# Patient Record
Sex: Male | Born: 1935
Health system: Southern US, Community
[De-identification: ages and names within clinical notes are randomized; demographics above are authoritative.]

## PROBLEM LIST (undated history)

## (undated) DIAGNOSIS — I429 Cardiomyopathy, unspecified: Secondary | ICD-10-CM

## (undated) DIAGNOSIS — N400 Enlarged prostate without lower urinary tract symptoms: Secondary | ICD-10-CM

## (undated) DIAGNOSIS — Z952 Presence of prosthetic heart valve: Secondary | ICD-10-CM

## (undated) DIAGNOSIS — I4821 Permanent atrial fibrillation: Secondary | ICD-10-CM

## (undated) DIAGNOSIS — E785 Hyperlipidemia, unspecified: Secondary | ICD-10-CM

## (undated) DIAGNOSIS — I10 Essential (primary) hypertension: Secondary | ICD-10-CM

## (undated) DIAGNOSIS — E7800 Pure hypercholesterolemia, unspecified: Secondary | ICD-10-CM

## (undated) DIAGNOSIS — D509 Iron deficiency anemia, unspecified: Secondary | ICD-10-CM

## (undated) DIAGNOSIS — I442 Atrioventricular block, complete: Secondary | ICD-10-CM

## (undated) HISTORY — DX: Iron deficiency anemia, unspecified: D50.9

## (undated) HISTORY — DX: Cardiomyopathy, unspecified: I42.9

## (undated) HISTORY — DX: Essential (primary) hypertension: I10

## (undated) HISTORY — DX: Permanent atrial fibrillation: I48.21

## (undated) HISTORY — DX: Hyperlipidemia, unspecified: E78.5

## (undated) HISTORY — DX: Benign prostatic hyperplasia without lower urinary tract symptoms: N40.0

## (undated) HISTORY — DX: Atrioventricular block, complete: I44.2

---

## 2007-10-03 ENCOUNTER — Emergency Department (HOSPITAL_COMMUNITY): Admission: EM | Admit: 2007-10-03 | Discharge: 2007-10-03 | Payer: Self-pay | Admitting: Emergency Medicine

## 2008-09-15 ENCOUNTER — Emergency Department (HOSPITAL_COMMUNITY): Admission: EM | Admit: 2008-09-15 | Discharge: 2008-09-15 | Payer: Self-pay | Admitting: Emergency Medicine

## 2010-04-29 HISTORY — PX: TRANSCATHETER AORTIC VALVE REPLACEMENT, TRANSAORTIC: SHX6402

## 2010-05-12 HISTORY — PX: PACEMAKER INSERTION: SHX728

## 2010-06-20 ENCOUNTER — Inpatient Hospital Stay (HOSPITAL_COMMUNITY)
Admission: EM | Admit: 2010-06-20 | Discharge: 2010-06-21 | DRG: 293 | Disposition: A | Discharge status: Home / Self Care | Payer: Medicare Other | Attending: Cardiology | Admitting: Cardiology

## 2010-06-20 ENCOUNTER — Emergency Department (HOSPITAL_COMMUNITY): Payer: Medicare Other

## 2010-06-20 DIAGNOSIS — I509 Heart failure, unspecified: Secondary | ICD-10-CM | POA: Diagnosis present

## 2010-06-20 DIAGNOSIS — I2510 Atherosclerotic heart disease of native coronary artery without angina pectoris: Secondary | ICD-10-CM | POA: Diagnosis present

## 2010-06-20 DIAGNOSIS — G589 Mononeuropathy, unspecified: Secondary | ICD-10-CM | POA: Diagnosis present

## 2010-06-20 DIAGNOSIS — Z7902 Long term (current) use of antithrombotics/antiplatelets: Secondary | ICD-10-CM

## 2010-06-20 DIAGNOSIS — I359 Nonrheumatic aortic valve disorder, unspecified: Secondary | ICD-10-CM | POA: Diagnosis present

## 2010-06-20 DIAGNOSIS — E7800 Pure hypercholesterolemia, unspecified: Secondary | ICD-10-CM | POA: Diagnosis present

## 2010-06-20 DIAGNOSIS — Z954 Presence of other heart-valve replacement: Secondary | ICD-10-CM

## 2010-06-20 DIAGNOSIS — H409 Unspecified glaucoma: Secondary | ICD-10-CM | POA: Diagnosis present

## 2010-06-20 DIAGNOSIS — I129 Hypertensive chronic kidney disease with stage 1 through stage 4 chronic kidney disease, or unspecified chronic kidney disease: Secondary | ICD-10-CM | POA: Diagnosis present

## 2010-06-20 DIAGNOSIS — I5023 Acute on chronic systolic (congestive) heart failure: Principal | ICD-10-CM | POA: Diagnosis present

## 2010-06-20 DIAGNOSIS — Z951 Presence of aortocoronary bypass graft: Secondary | ICD-10-CM

## 2010-06-20 DIAGNOSIS — N189 Chronic kidney disease, unspecified: Secondary | ICD-10-CM | POA: Diagnosis present

## 2010-06-20 DIAGNOSIS — Z950 Presence of cardiac pacemaker: Secondary | ICD-10-CM

## 2010-06-20 DIAGNOSIS — E785 Hyperlipidemia, unspecified: Secondary | ICD-10-CM | POA: Diagnosis present

## 2010-06-20 DIAGNOSIS — E119 Type 2 diabetes mellitus without complications: Secondary | ICD-10-CM | POA: Diagnosis present

## 2010-06-20 LAB — CBC
HCT: 30.4 % — ABNORMAL LOW (ref 39.0–52.0)
MCH: 31.6 pg (ref 26.0–34.0)
MCHC: 33.9 g/dL (ref 30.0–36.0)
MCV: 93.3 fL (ref 78.0–100.0)
RDW: 14.3 % (ref 11.5–15.5)

## 2010-06-20 LAB — BRAIN NATRIURETIC PEPTIDE: Pro B Natriuretic peptide (BNP): 1013 pg/mL — ABNORMAL HIGH (ref 0.0–100.0)

## 2010-06-20 LAB — COMPREHENSIVE METABOLIC PANEL
ALT: 20 U/L (ref 0–53)
Alkaline Phosphatase: 53 U/L (ref 39–117)
CO2: 25 mEq/L (ref 19–32)
Chloride: 108 mEq/L (ref 96–112)
GFR calc non Af Amer: 49 mL/min — ABNORMAL LOW (ref >60)
Glucose, Bld: 55 mg/dL — ABNORMAL LOW (ref 70–99)
Potassium: 4.6 mEq/L (ref 3.5–5.1)
Sodium: 138 mEq/L (ref 135–145)

## 2010-06-20 LAB — POCT CARDIAC MARKERS: CKMB, poc: <1 ng/mL — ABNORMAL LOW (ref 1.0–8.0)

## 2010-06-20 LAB — DIFFERENTIAL
Basophils Absolute: 0 10*3/uL (ref 0.0–0.1)
Eosinophils Relative: 9 % — ABNORMAL HIGH (ref 0–5)
Lymphocytes Relative: 24 % (ref 12–46)
Lymphs Abs: 1 10*3/uL (ref 0.7–4.0)
Monocytes Absolute: 0.6 10*3/uL (ref 0.1–1.0)

## 2010-06-20 LAB — GLUCOSE, CAPILLARY
Glucose-Capillary: 90 mg/dL (ref 70–99)
Glucose-Capillary: 90 mg/dL (ref 70–99)

## 2010-06-21 DIAGNOSIS — R0602 Shortness of breath: Secondary | ICD-10-CM

## 2010-06-21 LAB — BASIC METABOLIC PANEL
BUN: 29 mg/dL — ABNORMAL HIGH (ref 6–23)
Calcium: 8.8 mg/dL (ref 8.4–10.5)
GFR calc non Af Amer: 42 mL/min — ABNORMAL LOW (ref >60)
Potassium: 4.4 mEq/L (ref 3.5–5.1)
Sodium: 138 mEq/L (ref 135–145)

## 2010-06-21 LAB — CARDIAC PANEL(CRET KIN+CKTOT+MB+TROPI)
CK, MB: 0.8 ng/mL (ref 0.3–4.0)
Relative Index: INVALID (ref 0.0–2.5)
Total CK: 64 U/L (ref 7–232)
Troponin I: 0.03 ng/mL (ref 0.00–0.06)
Troponin I: 0.02 ng/mL (ref 0.00–0.06)

## 2010-06-21 LAB — HEMOGLOBIN A1C: Mean Plasma Glucose: 131 mg/dL — ABNORMAL HIGH (ref <117)

## 2010-06-21 LAB — GLUCOSE, CAPILLARY: Glucose-Capillary: 83 mg/dL (ref 70–99)

## 2010-06-25 ENCOUNTER — Encounter (HOSPITAL_COMMUNITY): Payer: Medicare Other

## 2010-06-25 NOTE — H&P (Signed)
NAMERITVIK, MCZEAL              ACCOUNT NO.:  [id number]  MEDICAL RECORD NO.:  [id number]           PATIENT TYPE:  I  LOCATION:  4741                         FACILITY:  [facility]  PHYSICIAN:  Jake Bathe, MD      DATE OF BIRTH:  [birth year]  DATE OF ADMISSION:  06/20/2010 DATE OF DISCHARGE:                             HISTORY & PHYSICAL   PRIMARY CARE PHYSICIAN:  Dr. Mack Guise in [place].  CARDIOLOGIST:  At [facility], Dr. Langston Masker - His physician assistant is April, [phone], extension [phone].  TAVI coordinator or study coordinator for his aortic valve is Curt Jews, [phone].  CHIEF COMPLAINT:  Shortness of breath.  HISTORY OF PRESENT ILLNESS:  An 77 year old male, who recently underwent TAVI or transfemoral aortic valve replacement at [facility] on May 08, 2010, with subsequent biventricular pacemaker placed on May 11, 2010, due to ejection fraction of 30%, here with complaints of dyspnea.  Last night, he began to feel more short of breath, sitting up in bed and struggling somewhat to breathe.  Earlier this morning, he spoke to his daughter, [name], telephone number [phone], who prompted him to go to [facility] Emergency Department for further evaluation.  When here in the emergency department after resting he actually began to feel better even prior to Lasix administration.  His lab work demonstrated a BNP of 1013, creatinine of 1.3, glucose of 55, normal liver function.  D-dimer was positive at 10.  INR was normal. Hemoglobin was 10.3, platelet count 128 with a white count of 4.3. Point-of-care cardiac markers were normal.  When talking with him currently he feels like he has improved and was able to give me a complete history along with assistance from [place], his daughter.  He denies any chest pain, fever, chills, nausea, vomiting, melena, or other bleeding episodes.  No syncope. No dizziness.  No weakness.  He just complained of  increasing dyspnea at rest with orthopnea.  NYHA class III to IV symptoms.  He has had prior bypass surgery in 2002 at [facility], and prior to his aortic valve replacement cardiac catheterization was performed, which he states demonstrated patent grafts.  I do not have medical records at this time although I have requested them.  PAST MEDICAL HISTORY: 1. Transfemoral aortic valve replacement (TAVI) at [facility] on     May 10, 2010, Dr. Romeo Apple and Dr. Kizzie Bane with primary     cardiologist there Dr. Langston Masker. 2. Biventricular pacemaker insertion - not ICD, on January 13. 3. Diabetes mellitus. 4. Neuropathy.  ALLERGIES:  No known drug allergies.  MEDICATIONS: 1. Aspirin 81 mg a day. 2. Metoprolol tartrate 12.5 mg twice a day. 3. Pravastatin 20 mg at night. 4. Glyburide 10 mg in the morning, 5 mg in the p.m. 5. Plavix 75 mg once a day. 6. Protonix 40 mg a day. 7. Timolol eye drops one bilaterally 0.4 mg. 8. Calcium. 9. Benadryl p.r.n. 10.Vicodin p.r.n. 11.Gabapentin 200 mg at night. 12.Finasteride 5 mg once a day. FAMILY HISTORY:  Currently noncontributory.  SOCIAL HISTORY:  Denies any smoking, drinking, or drug use.  Lives here in [place].  His primary physician is at the [facility], Dr. Mack Guise.  He lives next door to his daughter, Rickard Kennerly, telephone number [phone], who assist greatly in his care with he and his wife.  REVIEW OF SYSTEMS:  Unless specified above, all other 12 review of systems negative.  PHYSICAL EXAM:  VITAL SIGNS:  Blood pressure on arrival 130/29, latest is 152/49.  Note, wide pulse pressure.  Respiration rate 16, temperature 97.4, pulses in the 60s, currently paced. GENERAL:  Alert and oriented x3, in no acute distress, very cold currently, but he states that is normal for him. EYES:  Slightly pale conjunctivae.  EOMI.  No scleral icterus. NECK:  Supple.  No carotid bruits appreciated.  Pulses do not appear to be  bounding.  No thyromegaly.  No lymphadenopathy. CARDIOVASCULAR:  2/6 diastolic murmur heard best at right upper sternal border.  Difficult to appreciate a systolic murmur.  Normal rate and regular, very rare ectopy, prior bypass scar noted. LUNGS:  Light scattered rhonchi heard at bases, which seemed to clear with cough, otherwise normal respiratory effort.  No wheezes.  No rales. ABDOMEN:  Soft, nontender.  Normoactive bowel sounds.  No rebound.  No guarding.  No hepatosplenomegaly appreciated.  No bruits. EXTREMITIES:  There is no edema, palpable distal pulses. SKIN:  Warm and dry. GU:  Deferred. RECTAL:  Deferred. NEURO:  Nonfocal.  No tremors are noted.  No focal deficits. PSYCH:  Normal affect.  DATA:  As described above in HPI with elevated BNP, creatinine of 1.3. Echocardiogram performed, personally reviewed shows ejection fraction in the 35% to 40% range, mitral annular calcification with mild mitral regurgitation, stent graft in the aortic position is noted with moderate aortic insufficiency or regurgitation surrounding the stent graft in the 12 o'clock to 3 o'clock position in the parasternal short axis aortic view.  According to his daughter, there was aortic insufficiency postprocedure.  Right ventricular diameter is mildly dilated.  Full echo report to follow.  X-ray personally viewed shows biventricular pacemaker - not ICD peribronchial thickening noted,  Kerley B-lines noted, very small pleural effusion, right greater than left with mild edema noted in lung fields.  Aortic root appears calcified.  Stent graft and the aortic valve position noted.  ASSESSMENT AND PLAN:  An 77 year old male status post recent transfemoral aortic valve replacement at [facility] with biventricular pacer, prior bypass in 2002, diabetes, here with moderate aortic insufficiency, here with acute on chronic heart failure, systolic heart failure exacerbation. 1. Acute on chronic systolic  heart failure exacerbation - I will     administer Lasix 80 mg IV q.12 h. for diuresis.  Strict Is and Os,     daily weights.  He already feels better in the emergency department     and hopefully he will not have a prolonged hospitalization.  We     will continue with his low-dose metoprolol 12.5 mg twice a day.  I     will also add a very low-dose lisinopril 2.5 mg once a day.  Watch     his creatinine and potassium closely.  He deserves both ACE     inhibitor as well as beta-blocker with his systolic heart failure.     Also at home, I will likely place him on p.o. Lasix as an     outpatient.  Also, obtain a dietary consult and strict salt     restriction as well as fluid restriction. 2. Aortic valve replacement via transfemoral approach - moderate  aortic regurgitation is noted on echocardiogram.  Requesting     records from [facility] for comparison.  I have notified the     study coordinator Keane Police as well as Dr. Langston Masker' physician     assistant April of his admission here at [facility]. 3. Diabetes - glucose currently 58.  I will decrease his glyburide to     5 in the morning and 5 in the evening.  We may even need to     decrease this further given his renal insufficiency. 4. Chronic kidney disease - creatinine clearance is decreased,     creatinine is currently 1.38.  May have to adjust glyburide as     noted above.  We will place on insulin sliding scale to monitor his     glucose. 5. Positive D-dimer - secondary to inflammation/heart failure     exacerbation.  A CTA of the chest was ordered previously by the     emergency room physician, however, I canceled this given his     chronic kidney disease as well as concomitant acute systolic heart     failure.  Clinical suspicion for pulmonary embolism is low     especially when the diagnosis of heart failure is evident.     Continuing with aspirin and Plavix post procedure. 6. Hyperlipidemia - continue with  pravastatin 20 mg at night. 7. Neuropathy, gabapentin 200 mg at night.     Jake Bathe, MD     MCS/MEDQ  D:  06/20/2010  T:  06/21/2010  Job:  [id number]  cc:   Dr. Langston Masker Dr. Armour  Electronically Signed by Donato Schultz MD on 06/25/2010 08:00:52 AM

## 2010-06-25 NOTE — Discharge Summary (Signed)
Joseph Watts, Joseph Watts              ACCOUNT NO.:  [id number]  MEDICAL RECORD NO.:  [id number]           PATIENT TYPE:  I  LOCATION:  4741                         FACILITY:  [facility]  PHYSICIAN:  Jake Bathe, MD      DATE OF BIRTH:  [birth year]  DATE OF ADMISSION:  06/20/2010 DATE OF DISCHARGE:  06/21/2010                              DISCHARGE SUMMARY   PRIMARY CARE PHYSICIAN:  Dr. Mack Guise in [place].  CARDIOLOGIST:  Dr. Langston Masker at [facility] - his physician assistant is April, [phone], extension [phone].  TAVI coordinator for aortic valve is Curt Jews, [phone].  FINAL DIAGNOSES: 1. Acute on chronic systolic heart failure. 2. Aortic insufficiency - moderate. 3. Aortic valve replacement via transfemoral approach. 4. Diabetes. 5. Neuropathy. 6. Chronic kidney disease. 7. Glaucoma. 8. Biventricular pacemaker - not implantable cardioverter-     defibrillator.  PAST MEDICAL HISTORY:  Bypass surgery 2002 in [facility] - the patient states that prior to aortic valve replacement, cardiac catheterization demonstrated patent grafts, but I do not have current report.  Awaiting report.  Performed at [facility] May 08, 2010, and biventricular pacemaker placed on May 11, 2010, due to ejection fraction of 30%.  BRIEF HOSPITAL COURSE:  Joseph Watts reported to the emergency room on February 22 after spending the night before feeling dyspnea at rest while in bed.  He felt comfortable when sitting up.  Orthopnea present. He discussed this with his daughter, [name], telephone number [phone] early that next morning and she assisted him to the [facility] Emergency Department for further evaluation.  Once they were in the emergency division, chest x-ray was obtained which showed mild pulmonary edema, Kerley B lines suggestive of heart failure. I was then contacted for further assessment.  When I saw Mr. [name] in the emergency department, he actually  stated that he felt improved from a breathing perspective.  He did feel some minor nausea, but overall improved status.  An echocardiogram was performed during this initial evaluation which demonstrated the recently placed transfemoral aortic valve [employer] in the aortic valve position. There was moderate aortic regurgitation perivalvular in the 12 o'clock to 2 o'clock position in the parasternal short axis view.  This was an eccentric jet and in some views actual, more moderate to severe.  His ejection fraction was noted to be 35-40%; and when discussing this with his daughter, this is what they quoted him at [facility] following the biventricular pacemaker placement.  He was given Lasix administration 80 mg IV b.i.d. and placed on the telemetry for overnight.  Next morning, he was feeling improved with only minor nausea but able to eat his breakfast without difficulty.  He ambulated well, although did feel general mild weakness, which he has been feeling since his discharge in January.  His creatinine on admission was 1.38 and on discharge the next morning was 1.59.  According to [facility], his daughter, his creatinine had been running in the 1.5 to 1.7 range, so this is now at baseline after diuresis.  His total output was approximately 1400 to 2000 mL for a total net balance of negative 1.2  liters.  His weight on admission was 77.2 kg and on discharge was 75.7 kg.  His BNP on discharge was 1096 and relatively unchanged from admission.  Cardiac biomarkers were all normal.  Hemoglobin A1c was assessed that was 6.2.  On admission, his glucose was 50 and therefore glipizide was decreased to 5 mg a.m. and 5 mg p.m. from 10 mg a.m. and 5 mg p.m.  A D-dimer was assessed and was 10.7, elevated.  His daughter did note that he had some minor increased erythema in his right lower extremity and I did note that he had pitting edema surrounding his calf and some blanching in his right lower extremity.   A lower extremity Doppler of his venous system was performed which did demonstrate below-the-knee chronic-appearing thrombosis bilaterally.  There was no above-the-knee thrombosis detected.  This finding was relayed to his daughter. Hemoglobin was 10.3, hematocrit 30.4, platelet count was 128 and white count was 4.3.  MEDICATIONS ON DISCHARGE: 1. Additions were Lasix 20 mg p.o. once a day for baseline use.  He     had discontinued his Lasix at home. 2. Change was glipizide down to 5 mg in the morning 5 mg in the     evening, decreased from 10:00 a.m. to 5:00 p.m.  Other medications     remain the same including aspirin 81 mg once a day, Plavix 75 mg     once a day, Flomax 0.4 mg once a day, finasteride 5 mg once a day,     pravastatin 20 mg once a day, metoprolol tartrate 12.5 mg twice a     day (note moderate aortic insufficiency present with concomitant     cardiomyopathy), pantoprazole 40 mg once a day, calcium, vitamin D,     timolol 0.5 both eyes in the morning drops, melatonin p.r.n.,     latanoprost eyedrops 0.005 both eyes q.p.m., vitamin B6, vitamin C,     Vicodin p.r.n., gabapentin 200 mg at night, Benadryl p.r.n., Zyrtec     10 mg p.r.n.  On morning of discharge, she was feeling better, improved as is stated above.  Breathing was improved.  There were no rhonchi or crackles on lung exam.  Diastolic murmur 2/6 was still appreciated.  1+ pitting edema especially in the right lower extremity calf was noted.  After a long discussion with his daughter, [name], given his overall stability, I felt comfortable discharging him from the hospital with close followup.  She has arranged for him to have blood work done at the [place] tomorrow or Friday 24 to measure his creatinine.  I have also coordinated an office visit with myself on Tuesday of next week.  I have also stressed the importance to her to obtain an office visit with his primary cardiologist at [facility], Dr. Langston Masker.  I  have made calls into both Dr. Langston Masker' PA as well as Curt Jews, the study coordinator and left messages on their phone regarding his admission.  Total discharge time 40 minutes spent with patient, med reconciliation, review of medical records, instruction to patient as well as his daughter.     Jake Bathe, MD     MCS/MEDQ  D:  06/21/2010  T:  06/22/2010  Job:  [id number]  cc:   Dr. Langston Masker, [facility] Dr. Mack Guise, [facility]  Electronically Signed by Donato Schultz MD on 06/25/2010 08:00:59 AM

## 2010-06-27 ENCOUNTER — Encounter (HOSPITAL_COMMUNITY): Payer: Medicare Other

## 2010-06-29 ENCOUNTER — Encounter (HOSPITAL_COMMUNITY): Payer: Medicare Other

## 2010-07-02 ENCOUNTER — Encounter (HOSPITAL_COMMUNITY): Payer: Medicare Other

## 2010-07-04 ENCOUNTER — Encounter (HOSPITAL_COMMUNITY): Payer: Medicare Other

## 2010-07-06 ENCOUNTER — Encounter (HOSPITAL_COMMUNITY): Payer: Medicare Other

## 2010-07-09 ENCOUNTER — Encounter (HOSPITAL_COMMUNITY): Payer: Medicare Other

## 2010-07-11 ENCOUNTER — Encounter (HOSPITAL_COMMUNITY): Payer: Medicare Other

## 2010-07-13 ENCOUNTER — Encounter (HOSPITAL_COMMUNITY): Payer: Medicare Other

## 2010-07-16 ENCOUNTER — Other Ambulatory Visit: Payer: Self-pay

## 2010-07-16 ENCOUNTER — Encounter (HOSPITAL_COMMUNITY): Payer: Medicare Other

## 2010-07-16 ENCOUNTER — Encounter (HOSPITAL_COMMUNITY): Payer: Medicare Other | Attending: Cardiology

## 2010-07-16 DIAGNOSIS — I359 Nonrheumatic aortic valve disorder, unspecified: Secondary | ICD-10-CM | POA: Insufficient documentation

## 2010-07-16 DIAGNOSIS — Z5189 Encounter for other specified aftercare: Secondary | ICD-10-CM | POA: Insufficient documentation

## 2010-07-16 DIAGNOSIS — I5022 Chronic systolic (congestive) heart failure: Secondary | ICD-10-CM | POA: Insufficient documentation

## 2010-07-16 DIAGNOSIS — Z95810 Presence of automatic (implantable) cardiac defibrillator: Secondary | ICD-10-CM | POA: Insufficient documentation

## 2010-07-16 DIAGNOSIS — Z954 Presence of other heart-valve replacement: Secondary | ICD-10-CM | POA: Insufficient documentation

## 2010-07-16 DIAGNOSIS — N189 Chronic kidney disease, unspecified: Secondary | ICD-10-CM | POA: Insufficient documentation

## 2010-07-16 DIAGNOSIS — E119 Type 2 diabetes mellitus without complications: Secondary | ICD-10-CM | POA: Insufficient documentation

## 2010-07-17 LAB — GLUCOSE, CAPILLARY: Glucose-Capillary: 121 mg/dL — ABNORMAL HIGH (ref 70–99)

## 2010-07-18 ENCOUNTER — Encounter (HOSPITAL_COMMUNITY): Payer: Medicare Other

## 2010-07-18 ENCOUNTER — Other Ambulatory Visit: Payer: Self-pay | Admitting: Cardiology

## 2010-07-20 ENCOUNTER — Encounter (HOSPITAL_COMMUNITY): Payer: Medicare Other

## 2010-07-20 ENCOUNTER — Other Ambulatory Visit: Payer: Self-pay | Admitting: Cardiology

## 2010-07-20 ENCOUNTER — Encounter: Payer: Medicare Other | Admitting: Internal Medicine

## 2010-07-23 ENCOUNTER — Encounter (HOSPITAL_COMMUNITY): Payer: Medicare Other

## 2010-07-23 ENCOUNTER — Other Ambulatory Visit: Payer: Self-pay | Admitting: Cardiology

## 2010-07-25 ENCOUNTER — Encounter (HOSPITAL_COMMUNITY): Payer: Medicare Other

## 2010-07-26 LAB — GLUCOSE, CAPILLARY
Glucose-Capillary: 198 mg/dL — ABNORMAL HIGH (ref 70–99)
Glucose-Capillary: 198 mg/dL — ABNORMAL HIGH (ref 70–99)

## 2010-07-27 ENCOUNTER — Encounter (HOSPITAL_COMMUNITY): Payer: Medicare Other

## 2010-07-30 ENCOUNTER — Encounter (HOSPITAL_COMMUNITY): Payer: Non-veteran care | Attending: Cardiology

## 2010-07-30 ENCOUNTER — Encounter (HOSPITAL_COMMUNITY): Payer: Non-veteran care

## 2010-07-30 DIAGNOSIS — E119 Type 2 diabetes mellitus without complications: Secondary | ICD-10-CM | POA: Insufficient documentation

## 2010-07-30 DIAGNOSIS — Z95810 Presence of automatic (implantable) cardiac defibrillator: Secondary | ICD-10-CM | POA: Insufficient documentation

## 2010-07-30 DIAGNOSIS — Z954 Presence of other heart-valve replacement: Secondary | ICD-10-CM | POA: Insufficient documentation

## 2010-07-30 DIAGNOSIS — I5022 Chronic systolic (congestive) heart failure: Secondary | ICD-10-CM | POA: Insufficient documentation

## 2010-07-30 DIAGNOSIS — Z5189 Encounter for other specified aftercare: Secondary | ICD-10-CM | POA: Insufficient documentation

## 2010-07-30 DIAGNOSIS — N189 Chronic kidney disease, unspecified: Secondary | ICD-10-CM | POA: Insufficient documentation

## 2010-07-30 DIAGNOSIS — I359 Nonrheumatic aortic valve disorder, unspecified: Secondary | ICD-10-CM | POA: Insufficient documentation

## 2010-08-01 ENCOUNTER — Other Ambulatory Visit: Payer: Self-pay | Admitting: Internal Medicine

## 2010-08-01 ENCOUNTER — Encounter (HOSPITAL_COMMUNITY): Payer: Non-veteran care

## 2010-08-01 ENCOUNTER — Encounter (HOSPITAL_BASED_OUTPATIENT_CLINIC_OR_DEPARTMENT_OTHER): Payer: Medicare Other | Admitting: Internal Medicine

## 2010-08-01 DIAGNOSIS — D649 Anemia, unspecified: Secondary | ICD-10-CM

## 2010-08-01 LAB — CBC & DIFF AND RETIC
Eosinophils Absolute: 0.3 10*3/uL (ref 0.0–0.5)
MONO#: 0.6 10*3/uL (ref 0.1–0.9)
NEUT#: 1.7 10*3/uL (ref 1.5–6.5)
RBC: 3.04 10*6/uL — ABNORMAL LOW (ref 4.20–5.82)
RDW: 14 % (ref 11.0–14.6)
Retic %: 1.48 % (ref 0.50–1.60)
Retic Ct Abs: 44.99 10*3/uL (ref 24.10–77.50)
WBC: 3.6 10*3/uL — ABNORMAL LOW (ref 4.0–10.3)

## 2010-08-01 LAB — TECHNOLOGIST REVIEW

## 2010-08-03 ENCOUNTER — Encounter (HOSPITAL_COMMUNITY): Payer: Non-veteran care

## 2010-08-03 LAB — PROTEIN ELECTROPHORESIS, SERUM
Albumin ELP: 55.1 % — ABNORMAL LOW (ref 55.8–66.1)
Alpha-1-Globulin: 5.1 % — ABNORMAL HIGH (ref 2.9–4.9)
Beta 2: 3.8 % (ref 3.2–6.5)
Beta Globulin: 6.5 % (ref 4.7–7.2)

## 2010-08-03 LAB — COMPREHENSIVE METABOLIC PANEL
ALT: 18 U/L (ref 0–53)
AST: 30 U/L (ref 0–37)
Albumin: 3.7 g/dL (ref 3.5–5.2)
Calcium: 8.8 mg/dL (ref 8.4–10.5)
Chloride: 100 mEq/L (ref 96–112)
Potassium: 4.7 mEq/L (ref 3.5–5.3)
Total Protein: 6 g/dL (ref 6.0–8.3)

## 2010-08-03 LAB — FOLATE RBC: RBC Folate: 1663 ng/mL (ref >=366)

## 2010-08-06 ENCOUNTER — Encounter (HOSPITAL_COMMUNITY): Payer: Non-veteran care

## 2010-08-08 ENCOUNTER — Encounter (HOSPITAL_COMMUNITY): Payer: Non-veteran care

## 2010-08-10 ENCOUNTER — Encounter (HOSPITAL_COMMUNITY): Payer: Non-veteran care

## 2010-08-13 ENCOUNTER — Other Ambulatory Visit: Payer: Self-pay | Admitting: Cardiology

## 2010-08-13 ENCOUNTER — Encounter (HOSPITAL_COMMUNITY): Payer: Non-veteran care

## 2010-08-13 LAB — GLUCOSE, CAPILLARY: Glucose-Capillary: 92 mg/dL (ref 70–99)

## 2010-08-15 ENCOUNTER — Encounter (HOSPITAL_COMMUNITY): Payer: Non-veteran care

## 2010-08-17 ENCOUNTER — Encounter (HOSPITAL_COMMUNITY): Payer: Non-veteran care

## 2010-08-20 ENCOUNTER — Encounter (HOSPITAL_COMMUNITY): Payer: Non-veteran care

## 2010-08-20 ENCOUNTER — Other Ambulatory Visit: Payer: Self-pay | Admitting: Internal Medicine

## 2010-08-20 ENCOUNTER — Encounter (HOSPITAL_BASED_OUTPATIENT_CLINIC_OR_DEPARTMENT_OTHER): Payer: Medicare Other | Admitting: Internal Medicine

## 2010-08-20 ENCOUNTER — Other Ambulatory Visit: Payer: Self-pay | Admitting: Cardiology

## 2010-08-20 DIAGNOSIS — D649 Anemia, unspecified: Secondary | ICD-10-CM

## 2010-08-20 LAB — CBC WITH DIFFERENTIAL/PLATELET
Basophils Absolute: 0 10*3/uL (ref 0.0–0.1)
Eosinophils Absolute: 0.3 10*3/uL (ref 0.0–0.5)
HGB: 9.7 g/dL — ABNORMAL LOW (ref 13.0–17.1)
LYMPH%: 22.6 % (ref 14.0–49.0)
MCV: 96.1 fL (ref 79.3–98.0)
MONO%: 15.9 % — ABNORMAL HIGH (ref 0.0–14.0)
NEUT#: 2.2 10*3/uL (ref 1.5–6.5)
NEUT%: 53.6 % (ref 39.0–75.0)
Platelets: 97 10*3/uL — ABNORMAL LOW (ref 140–400)

## 2010-08-22 ENCOUNTER — Encounter (HOSPITAL_COMMUNITY): Payer: Non-veteran care

## 2010-08-24 ENCOUNTER — Encounter (HOSPITAL_COMMUNITY): Payer: Non-veteran care | Attending: Cardiology

## 2010-08-24 ENCOUNTER — Encounter (HOSPITAL_COMMUNITY): Payer: Non-veteran care

## 2010-08-24 DIAGNOSIS — E119 Type 2 diabetes mellitus without complications: Secondary | ICD-10-CM | POA: Insufficient documentation

## 2010-08-24 DIAGNOSIS — Z5189 Encounter for other specified aftercare: Secondary | ICD-10-CM | POA: Insufficient documentation

## 2010-08-24 DIAGNOSIS — Z954 Presence of other heart-valve replacement: Secondary | ICD-10-CM | POA: Insufficient documentation

## 2010-08-24 DIAGNOSIS — I359 Nonrheumatic aortic valve disorder, unspecified: Secondary | ICD-10-CM | POA: Insufficient documentation

## 2010-08-24 DIAGNOSIS — N189 Chronic kidney disease, unspecified: Secondary | ICD-10-CM | POA: Insufficient documentation

## 2010-08-24 DIAGNOSIS — Z95810 Presence of automatic (implantable) cardiac defibrillator: Secondary | ICD-10-CM | POA: Insufficient documentation

## 2010-08-24 DIAGNOSIS — I5022 Chronic systolic (congestive) heart failure: Secondary | ICD-10-CM | POA: Insufficient documentation

## 2010-08-26 ENCOUNTER — Emergency Department (HOSPITAL_COMMUNITY): Payer: Non-veteran care

## 2010-08-26 ENCOUNTER — Encounter: Payer: Self-pay | Admitting: Internal Medicine

## 2010-08-26 ENCOUNTER — Emergency Department (HOSPITAL_COMMUNITY)
Admission: EM | Admit: 2010-08-26 | Discharge: 2010-08-26 | Disposition: A | Discharge status: Home / Self Care | Payer: Non-veteran care | Attending: Emergency Medicine | Admitting: Emergency Medicine

## 2010-08-26 DIAGNOSIS — I351 Nonrheumatic aortic (valve) insufficiency: Secondary | ICD-10-CM

## 2010-08-26 DIAGNOSIS — H409 Unspecified glaucoma: Secondary | ICD-10-CM

## 2010-08-26 DIAGNOSIS — I509 Heart failure, unspecified: Secondary | ICD-10-CM | POA: Insufficient documentation

## 2010-08-26 DIAGNOSIS — Z951 Presence of aortocoronary bypass graft: Secondary | ICD-10-CM

## 2010-08-26 DIAGNOSIS — R0602 Shortness of breath: Secondary | ICD-10-CM | POA: Insufficient documentation

## 2010-08-26 DIAGNOSIS — I129 Hypertensive chronic kidney disease with stage 1 through stage 4 chronic kidney disease, or unspecified chronic kidney disease: Secondary | ICD-10-CM | POA: Insufficient documentation

## 2010-08-26 DIAGNOSIS — E119 Type 2 diabetes mellitus without complications: Secondary | ICD-10-CM

## 2010-08-26 DIAGNOSIS — I5022 Chronic systolic (congestive) heart failure: Secondary | ICD-10-CM

## 2010-08-26 DIAGNOSIS — Z950 Presence of cardiac pacemaker: Secondary | ICD-10-CM

## 2010-08-26 DIAGNOSIS — N189 Chronic kidney disease, unspecified: Secondary | ICD-10-CM | POA: Insufficient documentation

## 2010-08-26 DIAGNOSIS — E7800 Pure hypercholesterolemia, unspecified: Secondary | ICD-10-CM | POA: Insufficient documentation

## 2010-08-26 LAB — CBC
MCH: 31.5 pg (ref 26.0–34.0)
MCV: 96.8 fL (ref 78.0–100.0)
Platelets: 99 10*3/uL — ABNORMAL LOW (ref 150–400)
RDW: 13.5 % (ref 11.5–15.5)

## 2010-08-26 LAB — BASIC METABOLIC PANEL
BUN: 43 mg/dL — ABNORMAL HIGH (ref 6–23)
CO2: 26 mEq/L (ref 19–32)
Chloride: 105 mEq/L (ref 96–112)
Creatinine, Ser: 2 mg/dL — ABNORMAL HIGH (ref 0.4–1.5)
Glucose, Bld: 92 mg/dL (ref 70–99)
Potassium: 4.8 mEq/L (ref 3.5–5.1)

## 2010-08-26 LAB — URINALYSIS, ROUTINE W REFLEX MICROSCOPIC
Bilirubin Urine: NEGATIVE
Glucose, UA: NEGATIVE mg/dL
Ketones, ur: NEGATIVE mg/dL
Nitrite: NEGATIVE
Specific Gravity, Urine: 1.023 (ref 1.005–1.030)
pH: 5.5 (ref 5.0–8.0)

## 2010-08-26 LAB — DIFFERENTIAL
Basophils Relative: 1 % (ref 0–1)
Eosinophils Absolute: 0.2 10*3/uL (ref 0.0–0.7)
Eosinophils Relative: 6 % — ABNORMAL HIGH (ref 0–5)
Lymphs Abs: 1.1 10*3/uL (ref 0.7–4.0)
Monocytes Relative: 15 % — ABNORMAL HIGH (ref 3–12)
Neutrophils Relative %: 52 % (ref 43–77)

## 2010-08-26 LAB — URINE MICROSCOPIC-ADD ON

## 2010-08-26 LAB — POCT CARDIAC MARKERS

## 2010-08-26 LAB — BRAIN NATRIURETIC PEPTIDE: Pro B Natriuretic peptide (BNP): 1072 pg/mL — ABNORMAL HIGH (ref 0.0–100.0)

## 2010-08-26 NOTE — H&P (Signed)
Hospital Admission Note Date: 08/26/2010  Patient name: Joseph Watts Medical record number: [id number] Date of birth: July 18, 1926 Age: 77 y.o. Gender: male PCP: Dr. Mack Guise at [facility]  Medical Service: Internal Medicine Teaching Service B-1  Attending physician:  Dr. Darlina Sicilian Resident [phone]): Dr. Catheryn Bacon  Pager: [phone] Resident (R1): Dr. Bard Herbert   Pager: [phone]  Chief Complaint: Orthopnea  History of Present Illness: Patient is a 77 year old man with a PMH listed below who presented to the [facility] ER complaining of a day of increased orthopnea.  He uses a 14 in. Wedge pillow at home and noticed last night that he couldn't get to sleep at his usual height on the pillow.  He was able to get comfortable on a higher portion of the pillow and was able to sleep.  He denies any dyspnea, SOB, chest pain, ankle swelling, wheezing, nausea, vomiting, fever, chills, abdominal pain, diarrhea, or constipation.  He has been taking his medications as prescribed and is followed by his doctor at the  Hospital as well as Dr. Anne Fu of [facility].  He has a daughter who lives next door to him and gives him all his medications and helps cook and take care of he and his wife.  Current Outpatient Prescriptions  Medication Sig Dispense Refill  . Ascorbic Acid (VITAMIN C) 500 MG tablet Take 500 mg by mouth daily.        Marland Kitchen aspirin 81 MG tablet Take 81 mg by mouth daily.        . calcium citrate (CALCITRATE - DOSED IN MG ELEMENTAL CALCIUM) 950 MG tablet Take 1 tablet by mouth daily.        . carvedilol (COREG) 6.25 MG tablet Take 6.25 mg by mouth 2 (two) times daily with a meal.        . cetirizine (ZYRTEC) 10 MG tablet Take 10 mg by mouth daily.        . clopidogrel (PLAVIX) 75 MG tablet Take 75 mg by mouth daily.        . diphenhydrAMINE (SOMINEX) 25 MG tablet Take 25 mg by mouth at bedtime as needed.        . finasteride (PROSCAR) 5 MG tablet Take 5 mg by mouth daily.        . fluocinonide (LIDEX)  0.05 % ointment Apply topically 2 (two) times daily.        . furosemide (LASIX) 20 MG tablet Take 20 mg by mouth daily.        Marland Kitchen gabapentin (NEURONTIN) 100 MG capsule Take 200 mg by mouth at bedtime as needed.        Marland Kitchen glipiZIDE (GLUCOTROL) 5 MG tablet Take 5 mg by mouth 2 (two) times daily before a meal. 10 mg in AM and 5 mg in PM      . HYDROcodone-acetaminophen (NORCO) 5-325 MG per tablet Take 2 tablets by mouth every 6 (six) hours as needed.       . latanoprost (XALATAN) 0.005 % ophthalmic solution Place 1 drop into both eyes at bedtime.        . metoprolol tartrate (LOPRESSOR) 25 MG tablet Take 12.5 mg by mouth 2 (two) times daily.        . pantoprazole (PROTONIX) 40 MG tablet Take 40 mg by mouth daily.        . pravastatin (PRAVACHOL) 20 MG tablet Take 20 mg by mouth daily.        Marland Kitchen pyridoxine (B-6) 50 MG tablet  Take 50 mg by mouth daily.        . Tamsulosin HCl (FLOMAX) 0.4 MG CAPS Take 0.4 mg by mouth daily after supper.        . timolol (BETIMOL) 0.5 % ophthalmic solution Place 1 drop into both eyes 2 (two) times daily.          Allergies: NKDA  Past Medical History: Patient Active Problem List  Diagnoses  . Acute on chronic systolic heart failure  . Aortic insufficiency  . Diabetes mellitus  . CKD (chronic kidney disease)  . Glaucoma  . Status post biventricular cardiac pacemaker insertion  . Hx of CABG   Past Surgical History: 1. CABG in 2002 2. Aortic Valve Replacement 05/08/10 at [facility] 3. Biventricular Pacemaker 05/11/10 at [facility]  Family History:  Social History  . Marital Status: Married    Spouse Name: N/A    Number of Children: 2 from this marriage, 3 from previous marriage  . Years of Education: N/A   Occupational History  . 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     Social History Main Topics  . Smoking status: None  . Smokeless tobacco: None  . Alcohol Use: None  . Drug Use: None  . Sexually Active: Not  currently   Review of Systems: Negative except for as noted in the HPI.  Vital Signs:  Tm: 98.1 Pulse: 62-65  BP: 172/61  Resp: 19  Sats: 98% on RA.  Physical Exam: Constitutional: Vital signs reviewed.  Patient is a well-developed and well-nourished man in no acute distress and cooperative with exam. Alert and oriented x3.  Head: Normocephalic and atraumatic Ear: TM normal bilaterally Mouth: no erythema or exudates, MMM Eyes: PERRL, EOMI, conjunctivae normal, No scleral icterus.  Neck: Supple, Trachea midline normal ROM, No JVD, mass, thyromegaly, or carotid bruit present.  Cardiovascular: RRR, S1 normal, S2 normal, 3/6 diastolic murmur best heard in the base, pulses symmetric and intact bilaterally Pulmonary/Chest: CTAB, no wheezes, rales, or rhonchi Abdominal: Soft. Non-tender, non-distended, bowel sounds are normal, no masses, organomegaly, or guarding present.  GU: no CVA tenderness Musculoskeletal: No joint deformities, erythema, or stiffness, ROM full and no nontender Hematology: no cervical, inginal, or axillary adenopathy.  Neurological: A&O x3, Strenght is normal and symmetric bilaterally, cranial nerve II-XII are grossly intact, no focal motor deficit, sensory intact to light touch bilaterally.  Extremities: Trace edema in the ankles. Skin: Warm, dry and intact. No rash, cyanosis, or clubbing.  Psychiatric: Normal mood and affect. speech and behavior is normal. Judgment and thought content normal. Cognition and memory are normal.   Lab results: CBC:    Component Value Date/Time   WBC 4.1 08/26/2010 1215   HGB 9.7* 08/26/2010 1215   HGB 9.7* 08/20/2010 0826   HCT 29.8* 08/26/2010 1215   HCT 29.6* 08/20/2010 0826   PLT 99* 08/26/2010 1215   PLT 97* 08/20/2010 0826   MCV 96.8 08/26/2010 1215   MCV 96.1 08/20/2010 0826   NEUTROABS 2.1 08/26/2010 1215   LYMPHSABS 1.1 08/26/2010 1215   MONOABS 0.6 08/26/2010 1215   EOSABS 0.2 08/26/2010 1215   EOSABS 0.3 08/20/2010 0826   BASOSABS  0.0 08/26/2010 1215   BASOSABS 0.0 08/20/2010 0826   Basic Metabolic Panel:    Component Value Date/Time   NA 135 08/26/2010 1215   K 4.8 08/26/2010 1215   CL 105 08/26/2010 1215   CO2 26 08/26/2010 1215   BUN 43*  08/26/2010 1215   CREATININE 2.00* 08/26/2010 1215   GLUCOSE 92 08/26/2010 1215   CALCIUM 8.8 08/26/2010 1215    CKMB, POC                                 <1.0       l      1.0-8.0          ng/mL  Troponin I, POC                           <0.05             0.00-0.09        ng/mL  Myoglobin, POC                            179               12-200           ng/mL  Beta Natriuretic Peptide                  1072.0     h      0.0-100.0        pg/mL   Color, Urine                              YELLOW            YELLOW  Appearance                                CLEAR             CLEAR  Specific Gravity                          1.023             1.005-1.030  pH                                         5.5               5.0-8.0  Urine Glucose                             NEGATIVE          NEG              mg/dL  Bilirubin                                  NEGATIVE          NEG  Ketones                                   NEGATIVE          NEG              mg/dL  Blood  TRACE      a      NEG  Protein                                    30         a      NEG              mg/dL  Urobilinogen                              0.2               0.0-1.0          mg/dL  Nitrite                                    NEGATIVE          NEG  Leukocytes                                NEGATIVE          NEG Casts / HPF                               SEE NOTE.  a      NEG    HYALINE CASTS  RBC / HPF                                 0-2               <3               RBC/hpf  Imaging results:  1. Chest X-ray  Findings: There are bilateral pleural effusions present with mild  pulmonary vascular congestion. Cardiomegaly is stable.  A vascular stent is noted in the region of the aortic  root.  The bones are  osteopenic.  Median sternotomy sutures are noted from prior CABG.  Assessment & Plan by Problem: 1. Orthopnea:  Likely a very mild CHF exacerbation secondary to diet non-compliance.  States he ate a bowl of canned soup yesterday that was not reduced sodium.  He is saturating well on room air and has only the mild symptom of slightly worse orthopnea.    - Plan to D/C from ER with increased lasix to 40 mg daily.  - Close follow up in 7 days by [facility] or Dr. Chales Abrahams for a repeat Bmet and evaluation of the increased dose of lasix.  - If he continues to get worse and has more dyspnea or is unable to sleep even with the increased pillow height he should return to the hospital to be admitted for IV diuresis.  2. CKD:  Creatinine today is 2.0.  Daughter states that a week and a half ago at his [place] cardiologist visit that his creatinine was 2.2.  Our only baseline in our system is 1.3-1.6 so with the increased lasix dose he should make sure to have a Bmet done at his follow up to follow his creatinine.      R2/3______________________________      R1________________________________  ATTENDING: I  performed and/or observed a history and physical examination of the patient.  I discussed the case with the residents as noted and reviewed the residents' notes.  I agree with the findings and plan--please refer to the attending physician note for more details.  Signature________________________________  Printed Name_____________________________

## 2010-08-27 ENCOUNTER — Encounter (HOSPITAL_COMMUNITY): Payer: Non-veteran care

## 2010-08-29 ENCOUNTER — Encounter (HOSPITAL_COMMUNITY): Payer: Non-veteran care

## 2010-08-31 ENCOUNTER — Encounter (HOSPITAL_COMMUNITY): Payer: Non-veteran care

## 2010-09-03 ENCOUNTER — Encounter (HOSPITAL_COMMUNITY): Payer: Non-veteran care

## 2010-09-03 ENCOUNTER — Encounter (HOSPITAL_COMMUNITY): Payer: Non-veteran care | Attending: Cardiology

## 2010-09-03 DIAGNOSIS — Z5189 Encounter for other specified aftercare: Secondary | ICD-10-CM | POA: Insufficient documentation

## 2010-09-03 DIAGNOSIS — I5022 Chronic systolic (congestive) heart failure: Secondary | ICD-10-CM | POA: Insufficient documentation

## 2010-09-03 DIAGNOSIS — I359 Nonrheumatic aortic valve disorder, unspecified: Secondary | ICD-10-CM | POA: Insufficient documentation

## 2010-09-03 DIAGNOSIS — Z954 Presence of other heart-valve replacement: Secondary | ICD-10-CM | POA: Insufficient documentation

## 2010-09-03 DIAGNOSIS — N189 Chronic kidney disease, unspecified: Secondary | ICD-10-CM | POA: Insufficient documentation

## 2010-09-03 DIAGNOSIS — E119 Type 2 diabetes mellitus without complications: Secondary | ICD-10-CM | POA: Insufficient documentation

## 2010-09-03 DIAGNOSIS — Z95810 Presence of automatic (implantable) cardiac defibrillator: Secondary | ICD-10-CM | POA: Insufficient documentation

## 2010-09-05 ENCOUNTER — Encounter (HOSPITAL_COMMUNITY): Payer: Non-veteran care

## 2010-09-05 ENCOUNTER — Other Ambulatory Visit: Payer: Self-pay | Admitting: Cardiology

## 2010-09-05 LAB — GLUCOSE, CAPILLARY
Glucose-Capillary: 71 mg/dL (ref 70–99)
Glucose-Capillary: 72 mg/dL (ref 70–99)

## 2010-09-07 ENCOUNTER — Other Ambulatory Visit: Payer: Self-pay | Admitting: Internal Medicine

## 2010-09-07 ENCOUNTER — Encounter (HOSPITAL_COMMUNITY): Payer: Non-veteran care

## 2010-09-07 ENCOUNTER — Other Ambulatory Visit (HOSPITAL_COMMUNITY)
Admission: RE | Admit: 2010-09-07 | Discharge: 2010-09-07 | Disposition: A | Discharge status: Home / Self Care | Payer: Non-veteran care | Source: Ambulatory Visit | Attending: Internal Medicine | Admitting: Internal Medicine

## 2010-09-07 ENCOUNTER — Encounter (HOSPITAL_BASED_OUTPATIENT_CLINIC_OR_DEPARTMENT_OTHER): Payer: Non-veteran care | Admitting: Internal Medicine

## 2010-09-07 DIAGNOSIS — D649 Anemia, unspecified: Secondary | ICD-10-CM

## 2010-09-07 LAB — CBC WITH DIFFERENTIAL/PLATELET
BASO%: 0.6 % (ref 0.0–2.0)
LYMPH%: 24.8 % (ref 14.0–49.0)
MCHC: 34.2 g/dL (ref 32.0–36.0)
MONO#: 0.6 10*3/uL (ref 0.1–0.9)
MONO%: 16.2 % — ABNORMAL HIGH (ref 0.0–14.0)
Platelets: 84 10*3/uL — ABNORMAL LOW (ref 140–400)
RBC: 2.95 10*6/uL — ABNORMAL LOW (ref 4.20–5.82)
RDW: 13.3 % (ref 11.0–14.6)
WBC: 3.6 10*3/uL — ABNORMAL LOW (ref 4.0–10.3)

## 2010-09-07 LAB — LACTATE DEHYDROGENASE: LDH: 294 U/L — ABNORMAL HIGH (ref 94–250)

## 2010-09-10 ENCOUNTER — Encounter (HOSPITAL_COMMUNITY): Payer: Non-veteran care

## 2010-09-12 ENCOUNTER — Encounter (HOSPITAL_COMMUNITY): Payer: Non-veteran care

## 2010-09-13 ENCOUNTER — Other Ambulatory Visit: Payer: Self-pay | Admitting: Internal Medicine

## 2010-09-13 ENCOUNTER — Encounter (HOSPITAL_BASED_OUTPATIENT_CLINIC_OR_DEPARTMENT_OTHER): Payer: Non-veteran care | Admitting: Internal Medicine

## 2010-09-13 DIAGNOSIS — D638 Anemia in other chronic diseases classified elsewhere: Secondary | ICD-10-CM

## 2010-09-13 DIAGNOSIS — R5381 Other malaise: Secondary | ICD-10-CM

## 2010-09-13 DIAGNOSIS — D649 Anemia, unspecified: Secondary | ICD-10-CM

## 2010-09-13 LAB — CBC WITH DIFFERENTIAL/PLATELET
BASO%: 0.9 % (ref 0.0–2.0)
EOS%: 6.2 % (ref 0.0–7.0)
HGB: 9.7 g/dL — ABNORMAL LOW (ref 13.0–17.1)
MCH: 33.5 pg — ABNORMAL HIGH (ref 27.2–33.4)
MCHC: 34.9 g/dL (ref 32.0–36.0)
RDW: 13.2 % (ref 11.0–14.6)
WBC: 3.5 10*3/uL — ABNORMAL LOW (ref 4.0–10.3)
lymph#: 1 10*3/uL (ref 0.9–3.3)

## 2010-09-14 ENCOUNTER — Encounter (HOSPITAL_COMMUNITY): Payer: Non-veteran care

## 2010-09-17 ENCOUNTER — Encounter (HOSPITAL_COMMUNITY): Payer: Non-veteran care

## 2010-09-19 ENCOUNTER — Other Ambulatory Visit: Payer: Self-pay | Admitting: Cardiology

## 2010-09-19 ENCOUNTER — Encounter (HOSPITAL_COMMUNITY): Payer: Non-veteran care

## 2010-09-19 LAB — GLUCOSE, CAPILLARY
Glucose-Capillary: 65 mg/dL — ABNORMAL LOW (ref 70–99)
Glucose-Capillary: 104 mg/dL — ABNORMAL HIGH (ref 70–99)

## 2010-09-21 ENCOUNTER — Other Ambulatory Visit: Payer: Self-pay | Admitting: Cardiology

## 2010-09-21 ENCOUNTER — Encounter (HOSPITAL_COMMUNITY): Payer: Non-veteran care

## 2010-09-21 LAB — GLUCOSE, CAPILLARY: Glucose-Capillary: 118 mg/dL — ABNORMAL HIGH (ref 70–99)

## 2010-09-24 ENCOUNTER — Encounter (HOSPITAL_COMMUNITY): Payer: Non-veteran care

## 2010-09-26 ENCOUNTER — Encounter (HOSPITAL_COMMUNITY): Payer: Non-veteran care

## 2010-09-28 ENCOUNTER — Other Ambulatory Visit: Payer: Self-pay | Admitting: Cardiology

## 2010-09-28 ENCOUNTER — Encounter (HOSPITAL_COMMUNITY): Payer: Non-veteran care

## 2010-09-28 ENCOUNTER — Encounter (HOSPITAL_COMMUNITY): Payer: Non-veteran care | Attending: Cardiology

## 2010-09-28 DIAGNOSIS — Z95810 Presence of automatic (implantable) cardiac defibrillator: Secondary | ICD-10-CM | POA: Insufficient documentation

## 2010-09-28 DIAGNOSIS — N189 Chronic kidney disease, unspecified: Secondary | ICD-10-CM | POA: Insufficient documentation

## 2010-09-28 DIAGNOSIS — I359 Nonrheumatic aortic valve disorder, unspecified: Secondary | ICD-10-CM | POA: Insufficient documentation

## 2010-09-28 DIAGNOSIS — E119 Type 2 diabetes mellitus without complications: Secondary | ICD-10-CM | POA: Insufficient documentation

## 2010-09-28 DIAGNOSIS — Z5189 Encounter for other specified aftercare: Secondary | ICD-10-CM | POA: Insufficient documentation

## 2010-09-28 DIAGNOSIS — I5022 Chronic systolic (congestive) heart failure: Secondary | ICD-10-CM | POA: Insufficient documentation

## 2010-09-28 DIAGNOSIS — Z954 Presence of other heart-valve replacement: Secondary | ICD-10-CM | POA: Insufficient documentation

## 2010-09-28 LAB — GLUCOSE, CAPILLARY: Glucose-Capillary: 98 mg/dL (ref 70–99)

## 2010-10-01 ENCOUNTER — Other Ambulatory Visit: Payer: Self-pay | Admitting: Cardiology

## 2010-10-01 ENCOUNTER — Encounter (HOSPITAL_COMMUNITY): Payer: Non-veteran care

## 2010-10-01 LAB — GLUCOSE, CAPILLARY: Glucose-Capillary: 161 mg/dL — ABNORMAL HIGH (ref 70–99)

## 2010-10-03 ENCOUNTER — Encounter (HOSPITAL_COMMUNITY): Payer: Non-veteran care

## 2010-10-05 ENCOUNTER — Encounter (HOSPITAL_COMMUNITY): Payer: Non-veteran care

## 2010-10-05 ENCOUNTER — Other Ambulatory Visit: Payer: Self-pay | Admitting: Cardiology

## 2010-10-08 ENCOUNTER — Other Ambulatory Visit: Payer: Self-pay | Admitting: Cardiology

## 2010-10-08 ENCOUNTER — Encounter (HOSPITAL_COMMUNITY): Payer: Non-veteran care

## 2010-10-08 LAB — GLUCOSE, CAPILLARY: Glucose-Capillary: 144 mg/dL — ABNORMAL HIGH (ref 70–99)

## 2010-10-10 ENCOUNTER — Encounter (HOSPITAL_COMMUNITY): Payer: Non-veteran care

## 2010-10-10 ENCOUNTER — Other Ambulatory Visit: Payer: Self-pay | Admitting: Cardiology

## 2010-10-12 ENCOUNTER — Encounter (HOSPITAL_COMMUNITY): Payer: Non-veteran care

## 2010-10-12 ENCOUNTER — Other Ambulatory Visit: Payer: Self-pay | Admitting: Cardiology

## 2010-10-12 LAB — GLUCOSE, CAPILLARY: Glucose-Capillary: 124 mg/dL — ABNORMAL HIGH (ref 70–99)

## 2010-10-15 ENCOUNTER — Other Ambulatory Visit: Payer: Self-pay | Admitting: Cardiology

## 2010-10-15 ENCOUNTER — Encounter (HOSPITAL_COMMUNITY): Payer: Non-veteran care

## 2010-10-15 ENCOUNTER — Ambulatory Visit (HOSPITAL_COMMUNITY): Payer: Medicare Other

## 2010-10-17 ENCOUNTER — Other Ambulatory Visit: Payer: Self-pay | Admitting: Cardiology

## 2010-10-17 ENCOUNTER — Ambulatory Visit (HOSPITAL_COMMUNITY): Payer: Medicare Other

## 2010-10-17 ENCOUNTER — Encounter (HOSPITAL_COMMUNITY): Payer: Non-veteran care

## 2010-10-17 LAB — GLUCOSE, CAPILLARY: Glucose-Capillary: 171 mg/dL — ABNORMAL HIGH (ref 70–99)

## 2010-10-19 ENCOUNTER — Encounter (HOSPITAL_COMMUNITY): Payer: Non-veteran care

## 2010-10-19 ENCOUNTER — Ambulatory Visit (HOSPITAL_COMMUNITY): Payer: Medicare Other

## 2010-10-22 ENCOUNTER — Encounter (HOSPITAL_COMMUNITY): Payer: Non-veteran care

## 2010-10-24 ENCOUNTER — Encounter (HOSPITAL_COMMUNITY): Payer: Non-veteran care

## 2010-10-26 ENCOUNTER — Encounter (HOSPITAL_COMMUNITY): Payer: Non-veteran care

## 2010-10-29 ENCOUNTER — Encounter (HOSPITAL_COMMUNITY): Payer: Non-veteran care

## 2010-10-31 ENCOUNTER — Encounter (HOSPITAL_COMMUNITY): Payer: Non-veteran care

## 2010-11-02 ENCOUNTER — Encounter (HOSPITAL_COMMUNITY): Payer: Non-veteran care

## 2010-11-05 ENCOUNTER — Encounter (HOSPITAL_COMMUNITY): Payer: Non-veteran care

## 2010-11-07 ENCOUNTER — Encounter (HOSPITAL_COMMUNITY): Payer: Non-veteran care

## 2010-11-09 ENCOUNTER — Encounter (HOSPITAL_COMMUNITY): Payer: Non-veteran care

## 2010-11-12 ENCOUNTER — Encounter (HOSPITAL_COMMUNITY): Payer: Non-veteran care

## 2010-11-14 ENCOUNTER — Encounter (HOSPITAL_COMMUNITY): Payer: Non-veteran care

## 2010-11-16 ENCOUNTER — Encounter (HOSPITAL_COMMUNITY): Payer: Non-veteran care

## 2010-11-19 ENCOUNTER — Encounter (HOSPITAL_COMMUNITY): Payer: Non-veteran care

## 2010-11-21 ENCOUNTER — Encounter (HOSPITAL_COMMUNITY): Payer: Non-veteran care

## 2010-11-23 ENCOUNTER — Encounter (HOSPITAL_COMMUNITY): Payer: Non-veteran care

## 2010-11-26 ENCOUNTER — Encounter (HOSPITAL_COMMUNITY): Payer: Non-veteran care

## 2010-12-25 ENCOUNTER — Other Ambulatory Visit: Payer: Self-pay | Admitting: Internal Medicine

## 2010-12-25 ENCOUNTER — Encounter (HOSPITAL_BASED_OUTPATIENT_CLINIC_OR_DEPARTMENT_OTHER): Payer: Non-veteran care | Admitting: Internal Medicine

## 2010-12-25 DIAGNOSIS — R5381 Other malaise: Secondary | ICD-10-CM

## 2010-12-25 DIAGNOSIS — D649 Anemia, unspecified: Secondary | ICD-10-CM

## 2010-12-25 DIAGNOSIS — D638 Anemia in other chronic diseases classified elsewhere: Secondary | ICD-10-CM

## 2010-12-25 LAB — CBC WITH DIFFERENTIAL/PLATELET
Basophils Absolute: 0 10*3/uL (ref 0.0–0.1)
EOS%: 6.6 % (ref 0.0–7.0)
HCT: 26.7 % — ABNORMAL LOW (ref 38.4–49.9)
HGB: 9.1 g/dL — ABNORMAL LOW (ref 13.0–17.1)
MCH: 32.6 pg (ref 27.2–33.4)
MONO#: 0.5 10*3/uL (ref 0.1–0.9)
NEUT#: 1.7 10*3/uL (ref 1.5–6.5)
NEUT%: 50.4 % (ref 39.0–75.0)
RDW: 14.9 % — ABNORMAL HIGH (ref 11.0–14.6)
WBC: 3.4 10*3/uL — ABNORMAL LOW (ref 4.0–10.3)
lymph#: 0.9 10*3/uL (ref 0.9–3.3)

## 2010-12-25 LAB — COMPREHENSIVE METABOLIC PANEL
Albumin: 3.6 g/dL (ref 3.5–5.2)
CO2: 27 mEq/L (ref 19–32)
Glucose, Bld: 117 mg/dL — ABNORMAL HIGH (ref 70–99)
Sodium: 138 mEq/L (ref 135–145)
Total Bilirubin: 0.5 mg/dL (ref 0.3–1.2)
Total Protein: 6.1 g/dL (ref 6.0–8.3)

## 2010-12-25 LAB — LACTATE DEHYDROGENASE: LDH: 253 U/L — ABNORMAL HIGH (ref 94–250)

## 2011-01-24 LAB — SEDIMENTATION RATE: Sed Rate: 30 — ABNORMAL HIGH

## 2011-06-03 ENCOUNTER — Other Ambulatory Visit: Payer: Non-veteran care | Admitting: Lab

## 2011-06-03 ENCOUNTER — Ambulatory Visit (HOSPITAL_BASED_OUTPATIENT_CLINIC_OR_DEPARTMENT_OTHER): Payer: Non-veteran care | Admitting: Internal Medicine

## 2011-06-03 ENCOUNTER — Other Ambulatory Visit: Payer: Self-pay | Admitting: Internal Medicine

## 2011-06-03 ENCOUNTER — Telehealth: Payer: Self-pay | Admitting: Internal Medicine

## 2011-06-03 VITALS — BP 145/58 | HR 69 | Temp 97.0°F | Ht 73.0 in | Wt 176.9 lb

## 2011-06-03 DIAGNOSIS — D649 Anemia, unspecified: Secondary | ICD-10-CM

## 2011-06-03 DIAGNOSIS — D638 Anemia in other chronic diseases classified elsewhere: Secondary | ICD-10-CM

## 2011-06-03 LAB — CBC WITH DIFFERENTIAL/PLATELET
BASO%: 0.6 % (ref 0.0–2.0)
EOS%: 7.4 % — ABNORMAL HIGH (ref 0.0–7.0)
HGB: 9.4 g/dL — ABNORMAL LOW (ref 13.0–17.1)
MCH: 33.3 pg (ref 27.2–33.4)
MCHC: 34.2 g/dL (ref 32.0–36.0)
RBC: 2.82 10*6/uL — ABNORMAL LOW (ref 4.20–5.82)
RDW: 13.1 % (ref 11.0–14.6)
lymph#: 1.1 10*3/uL (ref 0.9–3.3)

## 2011-06-03 MED ORDER — INTEGRA PLUS PO CAPS: 1.0000 | ORAL_CAPSULE | Freq: Every day | ORAL | Status: DC

## 2011-06-03 NOTE — Progress Notes (Signed)
[facility] OFFICE PROGRESS NOTE   DIAGNOSIS: Anemia of chronic disease plus/minus iron deficiency.  PRIOR THERAPY: None  CURRENT THERAPY: Integra plus 1 capsule by mouth daily.  INTERVAL HISTORY: Joseph Watts 78 y.o. male returns to the clinic today for six-month followup visit accompanied his daughter. The patient is feeling fine and denied having any significant complaints today. He denied having any significant dizzy spells or fatigue. No chest pain or shortness of breath. No weight loss or night sweats. He is tolerating his treatment was Integra plus fairly well. He has repeat CBC performed earlier today and he is here for evaluation and discussion of his lab results.  ALLERGIES:   has no known allergies.  MEDICATIONS:  Current Outpatient Prescriptions  Medication Sig Dispense Refill  . Ascorbic Acid (VITAMIN C) 500 MG tablet Take 500 mg by mouth daily.        Marland Kitchen aspirin 81 MG tablet Take 81 mg by mouth daily.        . calcium citrate (CALCITRATE - DOSED IN MG ELEMENTAL CALCIUM) 950 MG tablet Take 1 tablet by mouth daily.        . carvedilol (COREG) 6.25 MG tablet Take 6.25 mg by mouth 2 (two) times daily with a meal.        . cetirizine (ZYRTEC) 10 MG tablet Take 10 mg by mouth daily.        . clopidogrel (PLAVIX) 75 MG tablet Take 75 mg by mouth daily.        . diphenhydrAMINE (SOMINEX) 25 MG tablet Take 25 mg by mouth at bedtime as needed.        . finasteride (PROSCAR) 5 MG tablet Take 5 mg by mouth daily.        . fluocinonide (LIDEX) 0.05 % ointment Apply topically 2 (two) times daily.        Marland Kitchen gabapentin (NEURONTIN) 100 MG capsule Take 200 mg by mouth at bedtime as needed.        Marland Kitchen glipiZIDE (GLUCOTROL) 5 MG tablet Take 2.5 mg by mouth daily. 10 mg in AM and 5 mg in PM      . latanoprost (XALATAN) 0.005 % ophthalmic solution Place 1 drop into both eyes at bedtime.        . pantoprazole (PROTONIX) 40 MG tablet Take 40 mg by mouth daily.        .  pravastatin (PRAVACHOL) 20 MG tablet Take 20 mg by mouth daily.        Marland Kitchen pyridoxine (B-6) 50 MG tablet Take 50 mg by mouth 2 (two) times daily.       . Tamsulosin HCl (FLOMAX) 0.4 MG CAPS Take 0.4 mg by mouth daily after supper.        . timolol (BETIMOL) 0.5 % ophthalmic solution Place 1 drop into both eyes 2 (two) times daily.        Marland Kitchen FeFum-FePoly-FA-B Cmp-C-Biot (INTEGRA PLUS) CAPS Take 1 capsule by mouth daily.  90 capsule  0  . HYDROcodone-acetaminophen (NORCO) 5-325 MG per tablet Take 2 tablets by mouth every 6 (six) hours as needed.         REVIEW OF SYSTEMS:  A comprehensive review of systems was negative.   PHYSICAL EXAMINATION: General appearance: alert, cooperative and no distress Resp: clear to auscultation bilaterally Cardio: regular rate and rhythm, S1, S2 normal, no murmur, click, rub or gallop GI: soft, non-tender; bowel sounds normal; no masses,  no organomegaly  Extremities: extremities normal, atraumatic, no cyanosis or edema  ECOG PERFORMANCE STATUS: 1 - Symptomatic but completely ambulatory  Blood pressure 145/58, pulse 69, temperature 97 F (36.1 C), temperature source Oral, height 6\' 1"  (1.854 m), weight 176 lb 14.4 oz (80.241 kg).  LABORATORY DATA: Lab Results  Component Value Date   WBC 4.1 06/03/2011   HGB 9.4* 06/03/2011   HCT 27.5* 06/03/2011   MCV 97.3 06/03/2011   PLT 104* 06/03/2011      Chemistry      Component Value Date/Time   NA 138 12/25/2010 1100   NA 138 12/25/2010 1100   K 4.9 12/25/2010 1100   K 4.9 12/25/2010 1100   CL 103 12/25/2010 1100   CL 103 12/25/2010 1100   CO2 27 12/25/2010 1100   CO2 27 12/25/2010 1100   BUN 41* 12/25/2010 1100   BUN 41* 12/25/2010 1100   CREATININE 1.91* 12/25/2010 1100   CREATININE 1.91* 12/25/2010 1100      Component Value Date/Time   CALCIUM 9.0 12/25/2010 1100   CALCIUM 9.0 12/25/2010 1100   ALKPHOS 52 12/25/2010 1100   ALKPHOS 52 12/25/2010 1100   AST 34 12/25/2010 1100   AST 34 12/25/2010 1100   ALT 28 12/25/2010 1100    ALT 28 12/25/2010 1100   BILITOT 0.5 12/25/2010 1100   BILITOT 0.5 12/25/2010 1100       RADIOGRAPHIC STUDIES: No results found.  ASSESSMENT: This is a very pleasant 61 his old white male with anemia of chronic disease plus/minus iron deficiency. He is currently on Integra +1 capsule by mouth daily and tolerating it fairly well. The patient has mild improvement in his hemoglobin and hematocrit in addition to improvement in his total white blood count as well as the platelets count. I discussed the lab result with the patient and his daughter  PLAN: I given his option of treatment with erythrocyte stimulating factor in addition to the Integra plus, but the patient is not interested in treatment with Aranesp. He will continue on the Integra plus for now. I would see him back for followup visit in 4 month with repeat CBC and iron study. The patient was advised to call me immediately if he has any concerning symptoms in the interval.   All questions were answered. The patient knows to call the clinic with any problems, questions or concerns. We can certainly see the patient much sooner if necessary.

## 2011-06-03 NOTE — Telephone Encounter (Signed)
Daughter called and reports that he needs refill on integra. Faxed order to [facility]

## 2011-06-03 NOTE — Telephone Encounter (Signed)
appt made and printed for 6/5    aom

## 2011-06-06 ENCOUNTER — Encounter: Payer: Self-pay | Admitting: Internal Medicine

## 2011-09-03 ENCOUNTER — Other Ambulatory Visit: Payer: Self-pay | Admitting: Medical Oncology

## 2011-09-03 DIAGNOSIS — D649 Anemia, unspecified: Secondary | ICD-10-CM

## 2011-09-03 MED ORDER — FERROUS SULFATE 325 (65 FE) MG PO TABS: 325.0000 mg | ORAL_TABLET | Freq: Every day | ORAL | Status: DC

## 2011-09-03 NOTE — Telephone Encounter (Signed)
Joseph Watts called for ? Med refill to send to the [place]. I called her back and left message for her to please clarify what she needs

## 2011-09-03 NOTE — Telephone Encounter (Signed)
[facility] [place] [facility] [phone] ext [phone]

## 2011-09-16 ENCOUNTER — Telehealth: Payer: Self-pay | Admitting: Medical Oncology

## 2011-09-16 NOTE — Telephone Encounter (Signed)
Requests records faxed to [facility] for fee based service . email sent to [facility]

## 2011-10-01 ENCOUNTER — Telehealth: Payer: Self-pay | Admitting: Internal Medicine

## 2011-10-01 NOTE — Telephone Encounter (Signed)
pts daughter called stating that the [place] would not allow/release him to see [place] right now and that if anyting changed she will let us know     aom

## 2011-10-02 ENCOUNTER — Other Ambulatory Visit: Payer: Non-veteran care

## 2011-10-02 ENCOUNTER — Ambulatory Visit: Payer: Non-veteran care | Admitting: Internal Medicine

## 2011-10-09 ENCOUNTER — Emergency Department (HOSPITAL_COMMUNITY)
Admission: EM | Admit: 2011-10-09 | Discharge: 2011-10-09 | Disposition: A | Discharge status: Home / Self Care | Payer: Non-veteran care | Attending: Emergency Medicine | Admitting: Emergency Medicine

## 2011-10-09 ENCOUNTER — Emergency Department (HOSPITAL_COMMUNITY): Payer: Non-veteran care

## 2011-10-09 ENCOUNTER — Encounter (HOSPITAL_COMMUNITY): Payer: Self-pay | Admitting: Emergency Medicine

## 2011-10-09 DIAGNOSIS — E7800 Pure hypercholesterolemia, unspecified: Secondary | ICD-10-CM | POA: Insufficient documentation

## 2011-10-09 DIAGNOSIS — W19XXXA Unspecified fall, initial encounter: Secondary | ICD-10-CM | POA: Insufficient documentation

## 2011-10-09 DIAGNOSIS — Z952 Presence of prosthetic heart valve: Secondary | ICD-10-CM | POA: Insufficient documentation

## 2011-10-09 DIAGNOSIS — Z7982 Long term (current) use of aspirin: Secondary | ICD-10-CM | POA: Insufficient documentation

## 2011-10-09 DIAGNOSIS — E119 Type 2 diabetes mellitus without complications: Secondary | ICD-10-CM | POA: Insufficient documentation

## 2011-10-09 DIAGNOSIS — R071 Chest pain on breathing: Secondary | ICD-10-CM | POA: Insufficient documentation

## 2011-10-09 DIAGNOSIS — R0789 Other chest pain: Secondary | ICD-10-CM

## 2011-10-09 DIAGNOSIS — Z79899 Other long term (current) drug therapy: Secondary | ICD-10-CM | POA: Insufficient documentation

## 2011-10-09 DIAGNOSIS — Z954 Presence of other heart-valve replacement: Secondary | ICD-10-CM | POA: Insufficient documentation

## 2011-10-09 HISTORY — DX: Presence of prosthetic heart valve: Z95.2

## 2011-10-09 HISTORY — DX: Pure hypercholesterolemia, unspecified: E78.00

## 2011-10-09 MED ORDER — HYDROCODONE-ACETAMINOPHEN 5-325 MG PO TABS: 2.0000 | ORAL_TABLET | ORAL | Status: AC | PRN

## 2011-10-09 NOTE — ED Notes (Signed)
Pt c/o left rib pain since falling last Thursday; pt sts worse pain with movement and palpation; pt sts was worse today

## 2011-10-09 NOTE — ED Provider Notes (Signed)
History     CSN: [id number]  Arrival date & time 10/09/11  1601   First MD Initiated Contact with Patient 10/09/11 2047      Chief Complaint  Patient presents with  . Chest Pain     HPI Patient states that on Thursday he tripped and fell landed on the left side of his rib cage.  Since that time he said discomfort in the left anterior rib area.  Bothers him at night when he tries to sleep.  Denies shortness of breath.  Denies substernal pain or arm pain. Past Medical History  Diagnosis Date  . Diabetes mellitus   . Hypercholesteremia   . Aortic valve replaced     History reviewed. No pertinent past surgical history.  History reviewed. No pertinent family history.  History  Substance Use Topics  . Smoking status: Never Smoker   . Smokeless tobacco: Not on file  . Alcohol Use: No      Review of Systems  All other systems reviewed and are negative.    Allergies  Review of patient's allergies indicates no known allergies.  Home Medications   Current Outpatient Rx  Name Route Sig Dispense Refill  . VITAMIN C 500 MG PO TABS Oral Take 500 mg by mouth daily.      . ASPIRIN 81 MG PO TABS Oral Take 81 mg by mouth daily.      Marland Kitchen CALCIUM CITRATE 950 MG PO TABS Oral Take 1 tablet by mouth daily.      Marland Kitchen CARVEDILOL 6.25 MG PO TABS Oral Take 6.25 mg by mouth 2 (two) times daily with a meal.      . CETIRIZINE HCL 10 MG PO TABS Oral Take 10 mg by mouth daily.      Marland Kitchen CLOPIDOGREL BISULFATE 75 MG PO TABS Oral Take 75 mg by mouth daily.      Marland Kitchen DIPHENHYDRAMINE HCL 25 MG PO CAPS Oral Take 25 mg by mouth at bedtime as needed. For sleep    . FERROUS SULFATE 325 (65 FE) MG PO TABS Oral Take 325 mg by mouth daily with breakfast. #90 called in to [facility]-they do not cover Integra    . FINASTERIDE 5 MG PO TABS Oral Take 5 mg by mouth daily.      Marland Kitchen FLUOCINONIDE 0.05 % EX OINT Topical Apply topically 2 (two) times daily.      Marland Kitchen GABAPENTIN 100 MG PO CAPS Oral Take 200 mg by mouth at  bedtime as needed. For pain    . GLIPIZIDE 5 MG PO TABS Oral Take 5-10 mg by mouth 2 (two) times daily before a meal. 2 tabs in the am, 1 tab in the pm    . HYDROCODONE-ACETAMINOPHEN 5-325 MG PO TABS Oral Take 2 tablets by mouth every 6 (six) hours as needed. For pain    . LATANOPROST 0.005 % OP SOLN Both Eyes Place 1 drop into both eyes at bedtime.      Marland Kitchen PANTOPRAZOLE SODIUM 40 MG PO TBEC Oral Take 40 mg by mouth daily.      Marland Kitchen PRAVASTATIN SODIUM 20 MG PO TABS Oral Take 20 mg by mouth daily.      Marland Kitchen PYRIDOXINE HCL 50 MG PO TABS Oral Take 50 mg by mouth 2 (two) times daily.     Marland Kitchen TAMSULOSIN HCL 0.4 MG PO CAPS Oral Take 0.4 mg by mouth daily after supper.      Marland Kitchen TIMOLOL HEMIHYDRATE 0.5 % OP SOLN Both Eyes Place  1 drop into both eyes 2 (two) times daily.      Marland Kitchen HYDROCODONE-ACETAMINOPHEN 5-325 MG PO TABS Oral Take 2 tablets by mouth every 4 (four) hours as needed for pain. 10 tablet 0    BP 157/78  Pulse 72  Temp 98.3 F (36.8 C) (Oral)  Resp 18  SpO2 100%  Physical Exam  Nursing note and vitals reviewed. Constitutional: He is oriented to person, place, and time. He appears well-developed and well-nourished. No distress.  HENT:  Head: Normocephalic and atraumatic.  Eyes: Pupils are equal, round, and reactive to light.  Neck: Normal range of motion.  Cardiovascular: Normal rate and intact distal pulses.        AV dual paced rhythm Rate = 71  QRS duration = 158 No significant change from previous EKG dated April 2012  Pulmonary/Chest: No respiratory distress.         Area of palpation reproduces pain  Abdominal: Normal appearance. He exhibits no distension.  Musculoskeletal: Normal range of motion.  Neurological: He is alert and oriented to person, place, and time. No cranial nerve deficit.  Skin: Skin is warm and dry. No rash noted.  Psychiatric: He has a normal mood and affect. His behavior is normal.    ED Course  Procedures (including critical care time)  Labs Reviewed - No  data to display Dg Ribs Unilateral W/chest Left  10/09/2011  *RADIOLOGY REPORT*  Clinical Data: Chest pain, fall  LEFT RIBS AND CHEST - 3+ VIEW  Comparison: Chest radiographs dated 08/26/2010  Findings: Lungs are essentially clear.  Possible small left pleural effusion versus basilar pleural thickening.  No pneumothorax.  The heart is normal in size.  Median sternotomy with mediastinal clips and aortic root stent.  Right subclavian pacemaker.  No left rib fracture is seen.  IMPRESSION: No left rib fracture is seen.  Original Report Authenticated By: Charline Bills, M.D.     1. Chest wall pain       MDM          Nelia Shi, MD 10/09/11 2117

## 2011-10-09 NOTE — ED Notes (Signed)
Patient stated that he fell yesterday and hurt his left rib area.  Denies LOC

## 2011-10-09 NOTE — Discharge Instructions (Signed)
Chest Wall Pain Chest wall pain is pain in or around the bones and muscles of your chest. It may take up to 6 weeks to get better. It may take longer if you must stay physically active in your work and activities.  CAUSES  Chest wall pain may happen on its own. However, it may be caused by:  A viral illness like the flu.   Injury.   Coughing.   Exercise.   Arthritis.   Fibromyalgia.   Shingles.  HOME CARE INSTRUCTIONS   Avoid overtiring physical activity. Try not to strain or perform activities that cause pain. This includes any activities using your chest or your abdominal and side muscles, especially if heavy weights are used.   Put ice on the sore area.   Put ice in a plastic bag.   Place a towel between your skin and the bag.   Leave the ice on for 15 to 20 minutes per hour while awake for the first 2 days.   Only take over-the-counter or prescription medicines for pain, discomfort, or fever as directed by your caregiver.  SEEK IMMEDIATE MEDICAL CARE IF:   Your pain increases, or you are very uncomfortable.   You have a fever.   Your chest pain becomes worse.   You have new, unexplained symptoms.   You have nausea or vomiting.   You feel sweaty or lightheaded.   You have a cough with phlegm (sputum), or you cough up blood.  MAKE SURE YOU:   Understand these instructions.   Will watch your condition.   Will get help right away if you are not doing well or get worse.  Document Released: 04/15/2005 Document Revised: 04/04/2011 Document Reviewed: 12/10/2010 Exit[employer] Patient Information 2012 [employer]. 

## 2011-10-09 NOTE — ED Notes (Signed)
Instructed patient to take deep breaths and cough every couple of hours while at home.  Demonstrated back - teach back.  Also stated that he was going to have to take a stool softener while taking the pain medicine

## 2012-04-07 ENCOUNTER — Telehealth: Payer: Self-pay | Admitting: Internal Medicine

## 2012-04-07 NOTE — Telephone Encounter (Signed)
pts daughter called to r/s his appt and had left a message.  i called her back and left a mess.Marland KitchenMarland Kitchen

## 2012-04-07 NOTE — Telephone Encounter (Signed)
appt now made

## 2012-05-05 ENCOUNTER — Telehealth: Payer: Self-pay | Admitting: Internal Medicine

## 2012-05-05 ENCOUNTER — Other Ambulatory Visit (HOSPITAL_BASED_OUTPATIENT_CLINIC_OR_DEPARTMENT_OTHER): Payer: Non-veteran care | Admitting: Lab

## 2012-05-05 ENCOUNTER — Ambulatory Visit (HOSPITAL_BASED_OUTPATIENT_CLINIC_OR_DEPARTMENT_OTHER): Payer: Non-veteran care | Admitting: Internal Medicine

## 2012-05-05 ENCOUNTER — Encounter: Payer: Self-pay | Admitting: Internal Medicine

## 2012-05-05 VITALS — BP 151/59 | HR 69 | Temp 97.6°F | Resp 18 | Ht 73.0 in | Wt 162.3 lb

## 2012-05-05 DIAGNOSIS — D638 Anemia in other chronic diseases classified elsewhere: Secondary | ICD-10-CM

## 2012-05-05 DIAGNOSIS — D649 Anemia, unspecified: Secondary | ICD-10-CM

## 2012-05-05 LAB — CBC WITH DIFFERENTIAL/PLATELET
BASO%: 0.9 % (ref 0.0–2.0)
Eosinophils Absolute: 0.4 10*3/uL (ref 0.0–0.5)
HCT: 29.3 % — ABNORMAL LOW (ref 38.4–49.9)
LYMPH%: 23.2 % (ref 14.0–49.0)
MCHC: 33.9 g/dL (ref 32.0–36.0)
MCV: 95.8 fL (ref 79.3–98.0)
MONO#: 0.6 10*3/uL (ref 0.1–0.9)
MONO%: 15.2 % — ABNORMAL HIGH (ref 0.0–14.0)
NEUT%: 51 % (ref 39.0–75.0)
Platelets: 137 10*3/uL — ABNORMAL LOW (ref 140–400)
RBC: 3.06 10*6/uL — ABNORMAL LOW (ref 4.20–5.82)
WBC: 4.1 10*3/uL (ref 4.0–10.3)

## 2012-05-05 LAB — IRON AND TIBC
TIBC: 224 ug/dL (ref 215–435)
UIBC: 166 ug/dL (ref 125–400)

## 2012-05-05 NOTE — Patient Instructions (Signed)
Her hemoglobin and hematocrit are low but stable. Iron study is unremarkable. Continue on Integra plus. Followup in 6 months with repeat blood work

## 2012-05-05 NOTE — Telephone Encounter (Signed)
Gave pt appt for July 2014 lab and MD °

## 2012-05-05 NOTE — Progress Notes (Signed)
[facility] [phone]   [phone]  OFFICE PROGRESS NOTE  DIAGNOSIS: Anemia of chronic disease plus/minus iron deficiency.   PRIOR THERAPY: None   CURRENT THERAPY: Integra plus 1 capsule by mouth daily.  INTERVAL HISTORY: Joseph Watts 79 y.o. male returns to the clinic today for followup visit accompanied his granddaughter. The patient is feeling fine today with no specific complaints except for mild fatigue. He denied having any dizzy spells. He denied having any nose or rectal bleeding. He has no significant weight loss or night sweats. He denied having any significant chest pain, shortness breath, cough or hemoptysis. The patient is currently on Integra plus and he is tolerating it fairly well. He denied having any significant adverse effect from this treatment. He had repeat CBC and iron study earlier today and he is here for evaluation and discussion of his lab results.  MEDICAL HISTORY: Past Medical History  Diagnosis Date  . Diabetes mellitus   . Hypercholesteremia   . Aortic valve replaced     ALLERGIES:   has no known allergies.  MEDICATIONS:  Current Outpatient Prescriptions  Medication Sig Dispense Refill  . aspirin 81 MG tablet Take 81 mg by mouth daily.        . calcium citrate (CALCITRATE - DOSED IN MG ELEMENTAL CALCIUM) 950 MG tablet Take 1 tablet by mouth 2 (two) times daily.       . carvedilol (COREG) 6.25 MG tablet Take 3.125 mg by mouth 2 (two) times daily with a meal.       . cetirizine (ZYRTEC) 10 MG tablet Take 10 mg by mouth daily.        . diphenhydrAMINE (BENADRYL) 25 mg capsule Take 25 mg by mouth at bedtime as needed. For sleep      . ferrous sulfate 325 (65 FE) MG tablet Take 325 mg by mouth daily with breakfast. #90 called in to [place] [facility]-they do not cover Integra      . finasteride (PROSCAR) 5 MG tablet Take 5 mg by mouth daily.        . fluocinonide (LIDEX) 0.05 % ointment Apply topically 2 (two) times  daily.        . fluticasone (FLONASE) 50 MCG/ACT nasal spray Place 2 sprays into the nose daily.      Marland Kitchen HYDROcodone-acetaminophen (NORCO) 5-325 MG per tablet Take 2 tablets by mouth every 6 (six) hours as needed. For pain      . latanoprost (XALATAN) 0.005 % ophthalmic solution Place 1 drop into both eyes at bedtime.        . Multiple Vitamin (MULTIVITAMIN) tablet Take 1 tablet by mouth daily.      . pantoprazole (PROTONIX) 40 MG tablet Take 40 mg by mouth daily.        . polyethylene glycol (MIRALAX / GLYCOLAX) packet Take 17 g by mouth daily.      . pravastatin (PRAVACHOL) 20 MG tablet Take 20 mg by mouth daily.        Marland Kitchen pyridoxine (B-6) 50 MG tablet Take 50 mg by mouth 2 (two) times daily.       . Tamsulosin HCl (FLOMAX) 0.4 MG CAPS Take 0.4 mg by mouth daily after supper.        . timolol (BETIMOL) 0.5 % ophthalmic solution Place 1 drop into both eyes 2 (two) times daily.          REVIEW OF SYSTEMS:  A comprehensive review of systems was  negative except for: Constitutional: positive for fatigue   PHYSICAL EXAMINATION: General appearance: alert, cooperative and no distress Head: Normocephalic, without obvious abnormality, atraumatic Neck: no adenopathy Lymph nodes: Cervical, supraclavicular, and axillary nodes normal. Resp: clear to auscultation bilaterally Cardio: regular rate and rhythm, S1, S2 normal, no murmur, click, rub or gallop GI: soft, non-tender; bowel sounds normal; no masses,  no organomegaly Extremities: extremities normal, atraumatic, no cyanosis or edema  ECOG PERFORMANCE STATUS: 1 - Symptomatic but completely ambulatory  Blood pressure 151/59, pulse 69, temperature 97.6 F (36.4 C), temperature source Oral, resp. rate 18, height 6\' 1"  (1.854 m), weight 162 lb 4.8 oz (73.619 kg).  LABORATORY DATA: Lab Results  Component Value Date   WBC 4.1 06/03/2011   HGB 9.4* 06/03/2011   HCT 27.5* 06/03/2011   MCV 97.3 06/03/2011   PLT 104* 06/03/2011      Chemistry        Component Value Date/Time   NA 138 12/25/2010 1100   NA 138 12/25/2010 1100   K 4.9 12/25/2010 1100   K 4.9 12/25/2010 1100   CL 103 12/25/2010 1100   CL 103 12/25/2010 1100   CO2 27 12/25/2010 1100   CO2 27 12/25/2010 1100   BUN 41* 12/25/2010 1100   BUN 41* 12/25/2010 1100   CREATININE 1.91* 12/25/2010 1100   CREATININE 1.91* 12/25/2010 1100      Component Value Date/Time   CALCIUM 9.0 12/25/2010 1100   CALCIUM 9.0 12/25/2010 1100   ALKPHOS 52 12/25/2010 1100   ALKPHOS 52 12/25/2010 1100   AST 34 12/25/2010 1100   AST 34 12/25/2010 1100   ALT 28 12/25/2010 1100   ALT 28 12/25/2010 1100   BILITOT 0.5 12/25/2010 1100   BILITOT 0.5 12/25/2010 1100       RADIOGRAPHIC STUDIES: No results found.  ASSESSMENT: This is a very pleasant 79 years old white male with anemia of chronic disease plus/minus iron deficiency, currently on treatment with Integra plus 1 capsule by mouth daily and he is tolerating it fairly well. His hemoglobin and hematocrit has been stable over the last 10 months or so. His iron study is also unremarkable  PLAN: I discussed the lab result with the patient today. I recommended for him to continue on Integra +1 capsule by mouth daily as scheduled. I also discussed with the patient and consideration of treatment with erythrocyte stimulating factor but the patient declined this option at this point.  He would come back for followup visit in 6 months with repeat CBC, iron study, ferritin and erythropoietin level. He was advised to call immediately if he has any concerning symptoms and interval  All questions were answered. The patient knows to call the clinic with any problems, questions or concerns. We can certainly see the patient much sooner if necessary.

## 2012-06-02 ENCOUNTER — Telehealth: Payer: Self-pay | Admitting: *Deleted

## 2012-06-02 NOTE — Telephone Encounter (Signed)
Progress note from Oris Drone at [facility] given to Dr Donnald Garre to review.

## 2012-08-07 ENCOUNTER — Telehealth: Payer: Self-pay | Admitting: Internal Medicine

## 2012-08-07 NOTE — Telephone Encounter (Signed)
returned pt call regarding r/s appt....pt wanted me to s/w daughter to r/s...advised him to have her call us back to r/s

## 2012-08-10 ENCOUNTER — Telehealth: Payer: Self-pay | Admitting: Internal Medicine

## 2012-08-10 NOTE — Telephone Encounter (Signed)
pt daughter called to r/s appts.Marland KitchenMarland KitchenMarland KitchenDone

## 2012-10-27 ENCOUNTER — Telehealth: Payer: Self-pay | Admitting: Internal Medicine

## 2012-10-27 NOTE — Telephone Encounter (Signed)
pt needed to r/s est appt...Marland KitchenMarland KitchenDone

## 2012-10-30 ENCOUNTER — Emergency Department (HOSPITAL_COMMUNITY)
Admission: EM | Admit: 2012-10-30 | Discharge: 2012-10-30 | Disposition: A | Discharge status: Home / Self Care | Payer: Medicare Other | Attending: Emergency Medicine | Admitting: Emergency Medicine

## 2012-10-30 ENCOUNTER — Emergency Department (HOSPITAL_COMMUNITY): Payer: Medicare Other

## 2012-10-30 ENCOUNTER — Encounter (HOSPITAL_COMMUNITY): Payer: Self-pay | Admitting: Unknown Physician Specialty

## 2012-10-30 DIAGNOSIS — Z950 Presence of cardiac pacemaker: Secondary | ICD-10-CM | POA: Insufficient documentation

## 2012-10-30 DIAGNOSIS — R079 Chest pain, unspecified: Secondary | ICD-10-CM

## 2012-10-30 DIAGNOSIS — Z79899 Other long term (current) drug therapy: Secondary | ICD-10-CM | POA: Insufficient documentation

## 2012-10-30 DIAGNOSIS — Z954 Presence of other heart-valve replacement: Secondary | ICD-10-CM | POA: Insufficient documentation

## 2012-10-30 DIAGNOSIS — R0789 Other chest pain: Secondary | ICD-10-CM | POA: Insufficient documentation

## 2012-10-30 DIAGNOSIS — E119 Type 2 diabetes mellitus without complications: Secondary | ICD-10-CM | POA: Insufficient documentation

## 2012-10-30 DIAGNOSIS — E7800 Pure hypercholesterolemia, unspecified: Secondary | ICD-10-CM | POA: Insufficient documentation

## 2012-10-30 DIAGNOSIS — I10 Essential (primary) hypertension: Secondary | ICD-10-CM | POA: Insufficient documentation

## 2012-10-30 DIAGNOSIS — Z7982 Long term (current) use of aspirin: Secondary | ICD-10-CM | POA: Insufficient documentation

## 2012-10-30 LAB — CBC WITH DIFFERENTIAL/PLATELET
Basophils Relative: 1 % (ref 0–1)
Hemoglobin: 11 g/dL — ABNORMAL LOW (ref 13.0–17.0)
Lymphs Abs: 1.5 10*3/uL (ref 0.7–4.0)
Monocytes Relative: 13 % — ABNORMAL HIGH (ref 3–12)
Neutro Abs: 1.8 10*3/uL (ref 1.7–7.7)
Neutrophils Relative %: 45 % (ref 43–77)
Platelets: 125 10*3/uL — ABNORMAL LOW (ref 150–400)
RBC: 3.39 MIL/uL — ABNORMAL LOW (ref 4.22–5.81)

## 2012-10-30 LAB — BASIC METABOLIC PANEL
BUN: 28 mg/dL — ABNORMAL HIGH (ref 6–23)
Chloride: 101 mEq/L (ref 96–112)
GFR calc Af Amer: 59 mL/min — ABNORMAL LOW (ref >90)
GFR calc non Af Amer: 51 mL/min — ABNORMAL LOW (ref >90)
Glucose, Bld: 133 mg/dL — ABNORMAL HIGH (ref 70–99)
Potassium: 4 mEq/L (ref 3.5–5.1)
Sodium: 135 mEq/L (ref 135–145)

## 2012-10-30 MED ORDER — SODIUM CHLORIDE 0.9 % IV SOLN
Freq: Once | INTRAVENOUS | Status: AC
  Administered 2012-10-30: 20 mL/h via INTRAVENOUS

## 2012-10-30 NOTE — ED Provider Notes (Signed)
History    CSN: [id number] Arrival date & time 10/30/12  1215  First MD Initiated Contact with Patient 10/30/12 1217     Chief Complaint  Patient presents with  . Chest Pain   (Consider location/radiation/quality/duration/timing/severity/associated sxs/prior Treatment) Patient is a 79 y.o. male presenting with chest pain. The history is provided by the patient. No language interpreter was used.  Chest Pain Pain location:  L chest Pain quality: aching   Pain radiates to the back: no   Pain severity:  Mild Associated symptoms: no fever   Associated symptoms comment:  Chest discomfort that started last night while at home. He describes for brief episodes that lasted seconds only. No SOB, nausea, cough, fever or weakness. Today the discomfort is lasting for a longer period, prompting evaluation in the emergency department. He reports a history of aortic valve repair, remote history of CABG, last cardiac catheterization in 2011 without stenting per daughter. He has a pacemaker as well.   Past Medical History  Diagnosis Date  . Diabetes mellitus   . Hypercholesteremia   . Aortic valve replaced    Past Surgical History  Procedure Laterality Date  . Pacemaker insertion     History reviewed. No pertinent family history. History  Substance Use Topics  . Smoking status: Never Smoker   . Smokeless tobacco: Not on file  . Alcohol Use: No    Review of Systems  Constitutional: Negative for fever and chills.  HENT: Negative.   Respiratory: Negative.   Cardiovascular: Positive for chest pain.  Gastrointestinal: Negative.   Musculoskeletal: Negative.   Skin: Negative.   Neurological: Negative.     Allergies  Review of patient's allergies indicates no known allergies.  Home Medications   Current Outpatient Rx  Name  Route  Sig  Dispense  Refill  . aspirin 81 MG tablet   Oral   Take 81 mg by mouth daily.           . calcium citrate-vitamin D (CITRACAL+D) 315-200 MG-UNIT  per tablet   Oral   Take 1 tablet by mouth daily.         . carvedilol (COREG) 6.25 MG tablet   Oral   Take 6.25 mg by mouth 2 (two) times daily with a meal.          . cetirizine (ZYRTEC) 10 MG tablet   Oral   Take 10 mg by mouth as needed for allergies.          . diphenhydrAMINE (BENADRYL) 25 mg capsule   Oral   Take 25 mg by mouth at bedtime as needed. For sleep         . ferrous sulfate 325 (65 FE) MG tablet   Oral   Take 325 mg by mouth daily with breakfast. #90 called in to [facility]-they do not cover Integra         . finasteride (PROSCAR) 5 MG tablet   Oral   Take 5 mg by mouth daily.           . fluocinonide (LIDEX) 0.05 % ointment   Topical   Apply 1 application topically daily.          . fluticasone (FLONASE) 50 MCG/ACT nasal spray   Nasal   Place 2 sprays into the nose as needed for allergies.          Marland Kitchen glipiZIDE (GLUCOTROL) 5 MG tablet   Oral   Take 5 mg by mouth as needed (diabetes).         Marland Kitchen  HYDROcodone-acetaminophen (NORCO) 5-325 MG per tablet   Oral   Take 1 tablet by mouth every 6 (six) hours as needed. For pain         . latanoprost (XALATAN) 0.005 % ophthalmic solution   Both Eyes   Place 1 drop into both eyes at bedtime.           . Multiple Vitamins-Minerals (PRESERVISION/LUTEIN PO)   Oral   Take 1 tablet by mouth daily.         . pantoprazole (PROTONIX) 40 MG tablet   Oral   Take 40 mg by mouth daily.           . pravastatin (PRAVACHOL) 20 MG tablet   Oral   Take 20 mg by mouth daily.           Marland Kitchen pyridoxine (B-6) 50 MG tablet   Oral   Take 50 mg by mouth 2 (two) times daily.          . Tamsulosin HCl (FLOMAX) 0.4 MG CAPS   Oral   Take 0.4 mg by mouth at bedtime.          . timolol (BETIMOL) 0.5 % ophthalmic solution   Both Eyes   Place 1 drop into both eyes daily.          Marland Kitchen triamcinolone cream (KENALOG) 0.1 %   Topical   Apply 1 application topically as needed (itching).           BP 172/56  Pulse 70  Temp(Src) 98.2 F (36.8 C) (Oral)  Resp 7  SpO2 100% Physical Exam  Constitutional: He is oriented to person, place, and time. He appears well-developed and well-nourished.  HENT:  Head: Normocephalic.  Neck: Normal range of motion. Neck supple.  Cardiovascular: Normal rate and regular rhythm.   Pulmonary/Chest: Effort normal and breath sounds normal. He exhibits no tenderness.  Right chest wall pacemaker palpable.   Abdominal: Soft. Bowel sounds are normal. There is no tenderness. There is no rebound and no guarding.  Musculoskeletal: Normal range of motion.  Neurological: He is alert and oriented to person, place, and time.  Skin: Skin is warm and dry. No rash noted.  Psychiatric: He has a normal mood and affect.    ED Course  Procedures (including critical care time) Labs Reviewed  CBC WITH DIFFERENTIAL - Abnormal; Notable for the following:    RBC 3.39 (*)    Hemoglobin 11.0 (*)    HCT 31.5 (*)    Platelets 125 (*)    Monocytes Relative 13 (*)    All other components within normal limits  BASIC METABOLIC PANEL - Abnormal; Notable for the following:    Glucose, Bld 133 (*)    BUN 28 (*)    GFR calc non Af Amer 51 (*)    GFR calc Af Amer 59 (*)    All other components within normal limits  TROPONIN I   Results for orders placed during the hospital encounter of 10/30/12  CBC WITH DIFFERENTIAL      Result Value Range   WBC 4.1  4.0 - 10.5 K/uL   RBC 3.39 (*) 4.22 - 5.81 MIL/uL   Hemoglobin 11.0 (*) 13.0 - 17.0 g/dL   HCT 16.1 (*) 09.6 - 04.5 %   MCV 92.9  78.0 - 100.0 fL   MCH 32.4  26.0 - 34.0 pg   MCHC 34.9  30.0 - 36.0 g/dL   RDW 40.9  81.1 - 91.4 %  Platelets 125 (*) 150 - 400 K/uL   Neutrophils Relative % 45  43 - 77 %   Neutro Abs 1.8  1.7 - 7.7 K/uL   Lymphocytes Relative 36  12 - 46 %   Lymphs Abs 1.5  0.7 - 4.0 K/uL   Monocytes Relative 13 (*) 3 - 12 %   Monocytes Absolute 0.5  0.1 - 1.0 K/uL   Eosinophils Relative 5  0 - 5 %    Eosinophils Absolute 0.2  0.0 - 0.7 K/uL   Basophils Relative 1  0 - 1 %   Basophils Absolute 0.0  0.0 - 0.1 K/uL  BASIC METABOLIC PANEL      Result Value Range   Sodium 135  135 - 145 mEq/L   Potassium 4.0  3.5 - 5.1 mEq/L   Chloride 101  96 - 112 mEq/L   CO2 26  19 - 32 mEq/L   Glucose, Bld 133 (*) 70 - 99 mg/dL   BUN 28 (*) 6 - 23 mg/dL   Creatinine, Ser 4.09  0.50 - 1.35 mg/dL   Calcium 8.9  8.4 - 81.1 mg/dL   GFR calc non Af Amer 51 (*) >90 mL/min   GFR calc Af Amer 59 (*) >90 mL/min  TROPONIN I      Result Value Range   Troponin I <0.30  <0.30 ng/mL   Dg Chest Portable 1 View  10/30/2012   *RADIOLOGY REPORT*  Clinical Data: Chest pain  PORTABLE CHEST - 1 VIEW  Comparison: Prior chest x-ray 10/09/2011  Findings: Right subclavian approach biventricular cardiac rhythm maintenance device with lead projecting over the right atrium, right ventricle and overlying the left ventricle.  This status post median sternotomy with evidence of transcatheter aortic valve replacement.  Atherosclerotic calcifications are noted in the transverse aorta.  The lungs are clear.  No edema, pneumothorax, consolidation or effusion.  No acute osseous abnormality.  IMPRESSION: No acute cardiopulmonary process.   Original Report Authenticated By: Malachy Moan, M.D.   Date: 10/30/2012  Rate: 70  Rhythm: premature atrial contractions (PAC)  QRS Axis: normal  Intervals: normal  ST/T Wave abnormalities: normal  Conduction Disutrbances:none  Narrative Interpretation: Paced   Old EKG Reviewed: unchanged   Dg Chest Portable 1 View  10/30/2012   *RADIOLOGY REPORT*  Clinical Data: Chest pain  PORTABLE CHEST - 1 VIEW  Comparison: Prior chest x-ray 10/09/2011  Findings: Right subclavian approach biventricular cardiac rhythm maintenance device with lead projecting over the right atrium, right ventricle and overlying the left ventricle.  This status post median sternotomy with evidence of transcatheter aortic valve  replacement.  Atherosclerotic calcifications are noted in the transverse aorta.  The lungs are clear.  No edema, pneumothorax, consolidation or effusion.  No acute osseous abnormality.  IMPRESSION: No acute cardiopulmonary process.   Original Report Authenticated By: Malachy Moan, M.D.   No diagnosis found. 1. Chest pain  MDM  Brief episodes chest discomfort continued in ED without EKG changes. [employer] in to interrogate pacemaker without abnormal finding. Labs, x-ray unremarkable. Discussed with Dr. Donnie Aho who agrees that patient is stable for discharge home and is encouraged to see Dr. Dione Housekeeper on Monday of next week. Dr. Deretha Emory has seen patient, has been involved with care and agrees with disposition.  Arnoldo Hooker, PA-C 10/30/12 1646

## 2012-10-30 NOTE — ED Notes (Signed)
Repeat EKG given to Dr. Deretha Emory

## 2012-10-30 NOTE — ED Notes (Signed)
Patient arrived via [facility] with chest pain that started last night. Described as sharp, left side. Worse today. Denies any N/V or diaphoresis. Patient took asprin 325mg  po prior to EMS arrival. EKG shows paced rhythm. VS 190/75, 70, 18.

## 2012-10-30 NOTE — ED Provider Notes (Signed)
Medical screening examination/treatment/procedure(s) were conducted as a shared visit with non-physician practitioner(s) and myself.  I personally evaluated the patient during the encounter  Patient seen by me. He is followed [facility]. Has a pacemaker. Specific type not clear family thinks maybe it's a defibrillator atrial pacer but based on EKG it seems to be more of a ventricular paced rhythm. Patient's had intermittent chest pain all very brief less than 5 minutes. Does not sound the myocardial infarction in nature. However could be warning signs. Have ordered interrogation of the pacemaker. We'll discuss the patient with [facility] when we have those results back.  Results for orders placed during the hospital encounter of 10/30/12  CBC WITH DIFFERENTIAL      Result Value Range   WBC 4.1  4.0 - 10.5 K/uL   RBC 3.39 (*) 4.22 - 5.81 MIL/uL   Hemoglobin 11.0 (*) 13.0 - 17.0 g/dL   HCT 40.9 (*) 81.1 - 91.4 %   MCV 92.9  78.0 - 100.0 fL   MCH 32.4  26.0 - 34.0 pg   MCHC 34.9  30.0 - 36.0 g/dL   RDW 78.2  95.6 - 21.3 %   Platelets 125 (*) 150 - 400 K/uL   Neutrophils Relative % 45  43 - 77 %   Neutro Abs 1.8  1.7 - 7.7 K/uL   Lymphocytes Relative 36  12 - 46 %   Lymphs Abs 1.5  0.7 - 4.0 K/uL   Monocytes Relative 13 (*) 3 - 12 %   Monocytes Absolute 0.5  0.1 - 1.0 K/uL   Eosinophils Relative 5  0 - 5 %   Eosinophils Absolute 0.2  0.0 - 0.7 K/uL   Basophils Relative 1  0 - 1 %   Basophils Absolute 0.0  0.0 - 0.1 K/uL  BASIC METABOLIC PANEL      Result Value Range   Sodium 135  135 - 145 mEq/L   Potassium 4.0  3.5 - 5.1 mEq/L   Chloride 101  96 - 112 mEq/L   CO2 26  19 - 32 mEq/L   Glucose, Bld 133 (*) 70 - 99 mg/dL   BUN 28 (*) 6 - 23 mg/dL   Creatinine, Ser 0.86  0.50 - 1.35 mg/dL   Calcium 8.9  8.4 - 57.8 mg/dL   GFR calc non Af Amer 51 (*) >90 mL/min   GFR calc Af Amer 59 (*) >90 mL/min  TROPONIN I      Result Value Range   Troponin I <0.30  <0.30 ng/mL   Dg  Chest Portable 1 View  10/30/2012   *RADIOLOGY REPORT*  Clinical Data: Chest pain  PORTABLE CHEST - 1 VIEW  Comparison: Prior chest x-ray 10/09/2011  Findings: Right subclavian approach biventricular cardiac rhythm maintenance device with lead projecting over the right atrium, right ventricle and overlying the left ventricle.  This status post median sternotomy with evidence of transcatheter aortic valve replacement.  Atherosclerotic calcifications are noted in the transverse aorta.  The lungs are clear.  No edema, pneumothorax, consolidation or effusion.  No acute osseous abnormality.  IMPRESSION: No acute cardiopulmonary process.   Original Report Authenticated By: Malachy Moan, M.D.      Shelda Jakes, MD 10/30/12 [phone]

## 2012-10-30 NOTE — ED Provider Notes (Signed)
Medical screening examination/treatment/procedure(s) were conducted as a shared visit with non-physician practitioner(s) and myself.  I personally evaluated the patient during the encounter  Shelda Jakes, MD 10/30/12 [phone]

## 2012-10-30 NOTE — ED Notes (Signed)
[employer] rep at bedside

## 2012-11-02 ENCOUNTER — Other Ambulatory Visit: Payer: Non-veteran care | Admitting: Lab

## 2012-11-04 ENCOUNTER — Other Ambulatory Visit: Payer: Non-veteran care | Admitting: Lab

## 2012-11-04 ENCOUNTER — Other Ambulatory Visit (HOSPITAL_BASED_OUTPATIENT_CLINIC_OR_DEPARTMENT_OTHER): Payer: Medicare Other

## 2012-11-04 ENCOUNTER — Ambulatory Visit: Payer: Non-veteran care | Admitting: Internal Medicine

## 2012-11-04 DIAGNOSIS — D649 Anemia, unspecified: Secondary | ICD-10-CM

## 2012-11-04 LAB — CBC WITH DIFFERENTIAL/PLATELET
Basophils Absolute: 0 10*3/uL (ref 0.0–0.1)
Eosinophils Absolute: 0.2 10*3/uL (ref 0.0–0.5)
HGB: 10.4 g/dL — ABNORMAL LOW (ref 13.0–17.1)
MCV: 95.5 fL (ref 79.3–98.0)
MONO#: 0.6 10*3/uL (ref 0.1–0.9)
MONO%: 16.7 % — ABNORMAL HIGH (ref 0.0–14.0)
NEUT#: 1.8 10*3/uL (ref 1.5–6.5)
RBC: 3.11 10*6/uL — ABNORMAL LOW (ref 4.20–5.82)
RDW: 12.8 % (ref 11.0–14.6)
WBC: 3.8 10*3/uL — ABNORMAL LOW (ref 4.0–10.3)

## 2012-11-04 LAB — IRON AND TIBC CHCC
%SAT: 39 % (ref 20–55)
Iron: 86 ug/dL (ref 42–163)
TIBC: 219 ug/dL (ref 202–409)
UIBC: 133 ug/dL (ref 117–376)

## 2012-11-04 LAB — FERRITIN CHCC: Ferritin: 328 ng/ml — ABNORMAL HIGH (ref 22–316)

## 2012-11-09 LAB — ERYTHROPOIETIN: Erythropoietin: 11.8 m[IU]/mL (ref 2.6–18.5)

## 2012-11-10 ENCOUNTER — Ambulatory Visit: Payer: Non-veteran care | Admitting: Internal Medicine

## 2012-11-10 ENCOUNTER — Telehealth: Payer: Self-pay | Admitting: Internal Medicine

## 2012-11-10 NOTE — Telephone Encounter (Signed)
Pt lmonvm to cx'd his 11:45 am appt w/MM today. Per pt his dtr who brings him is ill - cx appt and he will have his dtr call back to r/s when she is feeling better. Message forwarded to desk nurse.

## 2012-11-12 ENCOUNTER — Ambulatory Visit: Payer: Non-veteran care | Admitting: Internal Medicine

## 2012-11-17 ENCOUNTER — Telehealth: Payer: Self-pay | Admitting: Internal Medicine

## 2012-11-17 NOTE — Telephone Encounter (Signed)
pt called to r/s cx appt.Marland KitchenMarland KitchenMarland KitchenDone

## 2012-12-01 ENCOUNTER — Encounter: Payer: Self-pay | Admitting: Internal Medicine

## 2012-12-01 ENCOUNTER — Ambulatory Visit (HOSPITAL_BASED_OUTPATIENT_CLINIC_OR_DEPARTMENT_OTHER): Payer: Medicare Other | Admitting: Internal Medicine

## 2012-12-01 ENCOUNTER — Telehealth: Payer: Self-pay | Admitting: *Deleted

## 2012-12-01 VITALS — BP 141/74 | HR 74 | Temp 97.6°F | Resp 20 | Ht 73.0 in | Wt 167.7 lb

## 2012-12-01 DIAGNOSIS — R5381 Other malaise: Secondary | ICD-10-CM

## 2012-12-01 DIAGNOSIS — D638 Anemia in other chronic diseases classified elsewhere: Secondary | ICD-10-CM

## 2012-12-01 NOTE — Progress Notes (Signed)
[facility] [phone]   [phone]  OFFICE PROGRESS NOTE  Elby Showers, MD [address] [place] [address] [place] [address]  DIAGNOSIS: Anemia of chronic disease plus/minus iron deficiency.   PRIOR THERAPY: None   CURRENT THERAPY: Ferrous sulfate 325 mg by mouth daily.  INTERVAL HISTORY: Joseph Watts 79 y.o. male returns to the clinic today for followup visit accompanied by his daughter. The patient is feeling fine today with no specific complaints except for mild fatigue. He denied having any dizzy spells except for change of position. The patient denied having any bleeding issues. He has no chest pain, shortness of breath, cough or hemoptysis. He has no significant weight loss or night sweats. He has repeat CBC, iron study and ferritin performed recently and he is here for evaluation and discussion of his lab results.  MEDICAL HISTORY: Past Medical History  Diagnosis Date  . Diabetes mellitus   . Hypercholesteremia   . Aortic valve replaced     ALLERGIES:  has No Known Allergies.  MEDICATIONS:  Current Outpatient Prescriptions  Medication Sig Dispense Refill  . aspirin 81 MG tablet Take 81 mg by mouth daily.        . calcium citrate-vitamin D (CITRACAL+D) 315-200 MG-UNIT per tablet Take 1 tablet by mouth daily. Calcium 500mg -200mg  unit      . carvedilol (COREG) 6.25 MG tablet Take 6.25 mg by mouth 2 (two) times daily with a meal.       . cetirizine (ZYRTEC) 10 MG tablet Take 10 mg by mouth as needed for allergies.       . diphenhydrAMINE (BENADRYL) 25 mg capsule Take 25 mg by mouth at bedtime as needed. For sleep      . ferrous sulfate 325 (65 FE) MG tablet Take 325 mg by mouth daily with breakfast. #90 called in to [facility]-they do not cover Integra      . finasteride (PROSCAR) 5 MG tablet Take 5 mg by mouth daily.        . fluocinonide (LIDEX) 0.05 % ointment Apply 1 application topically daily.       . fluticasone  (FLONASE) 50 MCG/ACT nasal spray Place 2 sprays into the nose as needed for allergies.       Marland Kitchen glipiZIDE (GLUCOTROL) 5 MG tablet Take 5 mg by mouth as needed (diabetes).      Marland Kitchen HYDROcodone-acetaminophen (NORCO) 5-325 MG per tablet Take 1 tablet by mouth every 6 (six) hours as needed. For pain      . latanoprost (XALATAN) 0.005 % ophthalmic solution Place 1 drop into both eyes at bedtime.        . pantoprazole (PROTONIX) 40 MG tablet Take 40 mg by mouth daily.        . pravastatin (PRAVACHOL) 20 MG tablet Take 20 mg by mouth daily.        Marland Kitchen pyridoxine (B-6) 50 MG tablet Take 50 mg by mouth 2 (two) times daily.       . Tamsulosin HCl (FLOMAX) 0.4 MG CAPS Take 0.4 mg by mouth at bedtime.       . timolol (BETIMOL) 0.5 % ophthalmic solution Place 1 drop into both eyes daily.       Marland Kitchen triamcinolone cream (KENALOG) 0.1 % Apply 1 application topically as needed (itching).       No current facility-administered medications for this visit.    SURGICAL HISTORY:  Past Surgical History  Procedure Laterality Date  . Pacemaker  insertion      REVIEW OF SYSTEMS:  A comprehensive review of systems was negative except for: Constitutional: positive for fatigue   PHYSICAL EXAMINATION: General appearance: alert, cooperative, fatigued and no distress Head: Normocephalic, without obvious abnormality, atraumatic Neck: no adenopathy Lymph nodes: Cervical, supraclavicular, and axillary nodes normal. Resp: clear to auscultation bilaterally Cardio: regular rate and rhythm, S1, S2 normal, no murmur, click, rub or gallop GI: soft, non-tender; bowel sounds normal; no masses,  no organomegaly Extremities: extremities normal, atraumatic, no cyanosis or edema  ECOG PERFORMANCE STATUS: 1 - Symptomatic but completely ambulatory  Blood pressure 141/74, pulse 74, temperature 97.6 F (36.4 C), temperature source Oral, resp. rate 20, height 6\' 1"  (1.854 m), weight 167 lb 11.2 oz (76.068 kg).  LABORATORY DATA: Lab Results   Component Value Date   WBC 3.8* 11/04/2012   HGB 10.4* 11/04/2012   HCT 29.7* 11/04/2012   MCV 95.5 11/04/2012   PLT 105* 11/04/2012      Chemistry      Component Value Date/Time   NA 135 10/30/2012 1325   K 4.0 10/30/2012 1325   CL 101 10/30/2012 1325   CO2 26 10/30/2012 1325   BUN 28* 10/30/2012 1325   CREATININE 1.26 10/30/2012 1325      Component Value Date/Time   CALCIUM 8.9 10/30/2012 1325   ALKPHOS 52 12/25/2010 1100   AST 34 12/25/2010 1100   ALT 28 12/25/2010 1100   BILITOT 0.5 12/25/2010 1100       RADIOGRAPHIC STUDIES: No results found.  ASSESSMENT AND PLAN: This is a very pleasant 79 years old white male with anemia of chronic disease plus/minus deficiency currently on ferrous sulfate 325 mg by mouth daily. The patient has persistent anemia but with no significant decline compared to a few months ago. I recommend for the patient to continue on ferrous sulfate 325 mg but increased the frequency to twice a day. I also discussed with the patient again treatment with Aranesp but he still declining to consider this option. I would see him back for followup visit in 6 months with repeat CBC, iron study and ferritin. He was advised to call immediately if he has any concerning symptoms in the interval.  The patient voices understanding of current disease status and treatment options and is in agreement with the current care plan.  All questions were answered. The patient knows to call the clinic with any problems, questions or concerns. We can certainly see the patient much sooner if necessary.

## 2012-12-01 NOTE — Patient Instructions (Signed)
Continue ferrous sulfate  Followup visit in 6 months with repeat CBC and iron study.

## 2012-12-01 NOTE — Telephone Encounter (Signed)
appts made and printed...td 

## 2012-12-02 ENCOUNTER — Other Ambulatory Visit: Payer: Self-pay

## 2013-03-04 ENCOUNTER — Other Ambulatory Visit: Payer: Self-pay

## 2013-04-06 ENCOUNTER — Ambulatory Visit: Payer: Non-veteran care | Admitting: Cardiology

## 2013-04-14 ENCOUNTER — Encounter: Payer: Self-pay | Admitting: Cardiology

## 2013-04-15 ENCOUNTER — Ambulatory Visit (INDEPENDENT_AMBULATORY_CARE_PROVIDER_SITE_OTHER): Payer: Medicare Other | Admitting: Cardiology

## 2013-04-15 ENCOUNTER — Encounter: Payer: Self-pay | Admitting: Cardiology

## 2013-04-15 ENCOUNTER — Ambulatory Visit: Payer: Non-veteran care | Admitting: Cardiology

## 2013-04-15 VITALS — BP 141/51 | HR 70 | Ht 73.0 in | Wt 168.2 lb

## 2013-04-15 DIAGNOSIS — I351 Nonrheumatic aortic (valve) insufficiency: Secondary | ICD-10-CM

## 2013-04-15 DIAGNOSIS — I5022 Chronic systolic (congestive) heart failure: Secondary | ICD-10-CM

## 2013-04-15 DIAGNOSIS — I359 Nonrheumatic aortic valve disorder, unspecified: Secondary | ICD-10-CM

## 2013-04-15 DIAGNOSIS — Z951 Presence of aortocoronary bypass graft: Secondary | ICD-10-CM

## 2013-04-15 DIAGNOSIS — I5023 Acute on chronic systolic (congestive) heart failure: Secondary | ICD-10-CM

## 2013-04-15 LAB — BASIC METABOLIC PANEL
BUN: 26 mg/dL — ABNORMAL HIGH (ref 6–23)
Calcium: 8.7 mg/dL (ref 8.4–10.5)
Creatinine, Ser: 1.2 mg/dL (ref 0.4–1.5)

## 2013-04-15 NOTE — Progress Notes (Signed)
[address] [place], [place]  [address] Phone: [phone] Fax:  [phone]  Date:  04/15/2013   ID:  Joseph Watts, DOB November 22, 1926, MRN [id number]  PCP:  Elby Showers, MD   History of Present Illness: Joseph Watts is a 79 y.o. male status post TAVR X 2 at [facility]. Ejection fraction 35%. ICD-Bi-V.  After second valve procedure his central aortic regurgitation jet has resolved. He still has some regurgitation perivalvular. Notes reviewed.  Pancytopenia followed by Dr. Shirline Frees.  Left lower extremity edema resolved. He is off of Lasix, discontinued by [facility]. This was likely secondary to chronic kidney disease. I encouraged use of compression hose.  At one point I was concerned about hypotension however his carvedilol is now back up to 6.25. He is doing well. His [facility] physician increased this.  01/01/13 - mild SOB increased. Weight stable. No CP. Good UOP tried a few days of Lasix. Helped.  04/15/13-currently doing very well. Walking to [employer] without much difficulty. Still short of breath however. No changes made.    Wt Readings from Last 3 Encounters:  04/15/13 168 lb 3.2 oz (76.295 kg)  12/01/12 167 lb 11.2 oz (76.068 kg)  05/05/12 162 lb 4.8 oz (73.619 kg)     Past Medical History  Diagnosis Date  . Diabetes mellitus   . Hypercholesteremia   . Aortic valve replaced     Past Surgical History  Procedure Laterality Date  . Pacemaker insertion      Current Outpatient Prescriptions  Medication Sig Dispense Refill  . aspirin 81 MG tablet Take 81 mg by mouth daily.        . calcium citrate-vitamin D (CITRACAL+D) 315-200 MG-UNIT per tablet Take 1 tablet by mouth daily. Calcium 500mg -200mg  unit      . carvedilol (COREG) 6.25 MG tablet Take 6.25 mg by mouth 2 (two) times daily with a meal.       . cetirizine (ZYRTEC) 10 MG tablet Take 10 mg by mouth as needed for allergies.       . diphenhydrAMINE (BENADRYL) 25 mg capsule Take 25 mg by  mouth at bedtime as needed. For sleep      . ferrous sulfate 325 (65 FE) MG tablet Take 325 mg by mouth 2 (two) times daily with a meal. #90 called in to [facility]-they do not cover Integra      . finasteride (PROSCAR) 5 MG tablet Take 5 mg by mouth daily.        . fluocinonide (LIDEX) 0.05 % ointment Apply 1 application topically daily.       . fluticasone (FLONASE) 50 MCG/ACT nasal spray Place 2 sprays into the nose as needed for allergies.       Marland Kitchen glipiZIDE (GLUCOTROL) 5 MG tablet Take 5 mg by mouth as needed (diabetes).      Marland Kitchen HYDROcodone-acetaminophen (NORCO) 5-325 MG per tablet Take 1 tablet by mouth every 6 (six) hours as needed. For pain      . latanoprost (XALATAN) 0.005 % ophthalmic solution Place 1 drop into both eyes at bedtime.        . pantoprazole (PROTONIX) 40 MG tablet Take 40 mg by mouth daily.        . pravastatin (PRAVACHOL) 20 MG tablet Take 20 mg by mouth daily.        Marland Kitchen pyridoxine (B-6) 50 MG tablet Take 50 mg by mouth 2 (two) times daily.       . Tamsulosin  HCl (FLOMAX) 0.4 MG CAPS Take 0.4 mg by mouth at bedtime.       . timolol (BETIMOL) 0.5 % ophthalmic solution Place 1 drop into both eyes daily.       Marland Kitchen triamcinolone cream (KENALOG) 0.1 % Apply 1 application topically as needed (itching).       No current facility-administered medications for this visit.    Allergies:   No Known Allergies  Social History:  The patient  reports that he has never smoked. He does not have any smokeless tobacco history on file. He reports that he does not drink alcohol or use illicit drugs.   ROS:  Please see the history of present illness.   No syncope, no bleeding, no orthopnea, no PND, no chest pain    PHYSICAL EXAM: VS:  BP 141/51  Pulse 70  Ht 6\' 1"  (1.854 m)  Wt 168 lb 3.2 oz (76.295 kg)  BMI 22.20 kg/m2 Well nourished, well developed, in no acute distress HEENT: normal Neck: no JVD Cardiac:  normal S1, S2; RRR; 1/6 diastolic murmur RUSB Lungs:  clear to auscultation  bilaterally, no wheezing, rhonchi or rales Abd: soft, nontender, no hepatomegaly Ext: no edema Skin: warm and dry Neuro: no focal abnormalities noted  EKG:  AV pacing, 70                                                                                                                                                         Echocardiogram  2/14 at [facility]-EF 45%, mild aortic regurgitation, mild LVH  ASSESSMENT AND PLAN:  1. Chronic systolic heart failure-ejection fraction has improved with biventricular pacing. EF currently approximately 45%. This was last checked on 2/14 at [facility]. We will continue with carvedilol. Unable to up titrate do to hypotension previously. Occasional Lasix is being utilized. Chronic kidney disease noted. No ACE inhibitor because of chronic kidney disease and hypertension. 2. Chronic kidney disease stage III-check basic metabolic profile today. 3. Biventricular ICD-this is interrogated at [facility]. 4. Aortic insufficiency-mild to moderate post TAVR 5. We will see back in 4 months.  Signed, Donato Schultz, MD [facility]  04/15/2013 10:24 AM

## 2013-04-15 NOTE — Patient Instructions (Signed)
Your physician recommends that you have  lab work today : BMET  Your physician wants you to follow-up in: 4 months with Dr. Anne Fu You will receive a reminder letter in the mail two months in advance. If you don't receive a letter, please call our office to schedule the follow-up appointment.   Your physician recommends that you continue on your current medications as directed. Please refer to the Current Medication list given to you today.

## 2013-04-19 ENCOUNTER — Encounter: Payer: Self-pay | Admitting: Internal Medicine

## 2013-04-28 ENCOUNTER — Encounter: Payer: Self-pay | Admitting: Internal Medicine

## 2013-05-21 ENCOUNTER — Telehealth: Payer: Self-pay | Admitting: Internal Medicine

## 2013-05-21 NOTE — Telephone Encounter (Signed)
Faxed pt medical records to Dr. Holwerda °

## 2013-05-26 ENCOUNTER — Encounter: Payer: Non-veteran care | Admitting: Internal Medicine

## 2013-05-27 ENCOUNTER — Other Ambulatory Visit: Payer: Non-veteran care

## 2013-05-27 ENCOUNTER — Ambulatory Visit (HOSPITAL_BASED_OUTPATIENT_CLINIC_OR_DEPARTMENT_OTHER): Payer: Medicare Other

## 2013-05-27 DIAGNOSIS — D638 Anemia in other chronic diseases classified elsewhere: Secondary | ICD-10-CM

## 2013-05-27 LAB — IRON AND TIBC CHCC
%SAT: 35 % (ref 20–55)
IRON: 73 ug/dL (ref 42–163)
TIBC: 208 ug/dL (ref 202–409)
UIBC: 134 ug/dL (ref 117–376)

## 2013-05-27 LAB — CBC WITH DIFFERENTIAL/PLATELET
BASO%: 0.7 % (ref 0.0–2.0)
Basophils Absolute: 0 10*3/uL (ref 0.0–0.1)
EOS ABS: 0.4 10*3/uL (ref 0.0–0.5)
EOS%: 10.8 % — ABNORMAL HIGH (ref 0.0–7.0)
HCT: 31 % — ABNORMAL LOW (ref 38.4–49.9)
HGB: 10.2 g/dL — ABNORMAL LOW (ref 13.0–17.1)
LYMPH#: 1.1 10*3/uL (ref 0.9–3.3)
LYMPH%: 25.9 % (ref 14.0–49.0)
MCH: 31.6 pg (ref 27.2–33.4)
MCHC: 32.9 g/dL (ref 32.0–36.0)
MCV: 96 fL (ref 79.3–98.0)
MONO#: 0.6 10*3/uL (ref 0.1–0.9)
MONO%: 13.7 % (ref 0.0–14.0)
NEUT%: 48.9 % (ref 39.0–75.0)
NEUTROS ABS: 2 10*3/uL (ref 1.5–6.5)
PLATELETS: 110 10*3/uL — AB (ref 140–400)
RBC: 3.23 10*6/uL — AB (ref 4.20–5.82)
RDW: 12.9 % (ref 11.0–14.6)
WBC: 4.1 10*3/uL (ref 4.0–10.3)

## 2013-05-27 LAB — FERRITIN CHCC: FERRITIN: 531 ng/mL — AB (ref 22–316)

## 2013-05-28 MED ORDER — MIDAZOLAM HCL 2 MG/2ML IJ SOLN
INTRAMUSCULAR | Status: AC
  Filled 2013-05-28: qty 2

## 2013-05-28 MED ORDER — LIDOCAINE HCL (CARDIAC) 20 MG/ML IV SOLN
INTRAVENOUS | Status: AC
  Filled 2013-05-28: qty 5

## 2013-05-28 MED ORDER — DEXAMETHASONE SODIUM PHOSPHATE 10 MG/ML IJ SOLN
INTRAMUSCULAR | Status: AC
  Filled 2013-05-28: qty 1

## 2013-05-28 MED ORDER — ONDANSETRON HCL 4 MG/2ML IJ SOLN
INTRAMUSCULAR | Status: AC
  Filled 2013-05-28: qty 2

## 2013-05-28 MED ORDER — PROPOFOL 10 MG/ML IV BOLUS
INTRAVENOUS | Status: AC
  Filled 2013-05-28: qty 20

## 2013-05-28 MED ORDER — FENTANYL CITRATE 0.05 MG/ML IJ SOLN
INTRAMUSCULAR | Status: AC
  Filled 2013-05-28: qty 5

## 2013-06-03 ENCOUNTER — Other Ambulatory Visit: Payer: Non-veteran care | Admitting: Lab

## 2013-06-03 ENCOUNTER — Ambulatory Visit (HOSPITAL_BASED_OUTPATIENT_CLINIC_OR_DEPARTMENT_OTHER): Payer: Non-veteran care | Admitting: Internal Medicine

## 2013-06-03 ENCOUNTER — Encounter: Payer: Self-pay | Admitting: Internal Medicine

## 2013-06-03 VITALS — BP 153/51 | HR 69 | Temp 97.0°F | Resp 18 | Ht 73.0 in | Wt 169.9 lb

## 2013-06-03 DIAGNOSIS — D638 Anemia in other chronic diseases classified elsewhere: Secondary | ICD-10-CM

## 2013-06-03 DIAGNOSIS — R5383 Other fatigue: Secondary | ICD-10-CM

## 2013-06-03 DIAGNOSIS — R5381 Other malaise: Secondary | ICD-10-CM

## 2013-06-03 DIAGNOSIS — E119 Type 2 diabetes mellitus without complications: Secondary | ICD-10-CM

## 2013-06-03 NOTE — Progress Notes (Signed)
[facility] Telephone:([phone]   [phone]  OFFICE PROGRESS NOTE  Joseph Watts, Scott, MD [facility]  DIAGNOSIS: Anemia of chronic disease plus/minus iron deficiency.   PRIOR THERAPY: None   CURRENT THERAPY: Ferrous sulfate 325 mg by mouth daily.  INTERVAL HISTORY: Joseph Watts 80 y.o. male returns to the clinic today for followup visit accompanied by his daughter. The patient is feeling fine today with no specific complaints except for mild fatigue. He has Primary care physician Dr.Holwerda. The patient denied having any bleeding issues. He has no chest pain, shortness of breath, cough or hemoptysis. He has no significant weight loss or night sweats. He has repeat CBC, iron study and ferritin performed recently and he is here for evaluation and discussion of his lab results.  MEDICAL HISTORY: Past Medical History  Diagnosis Date  . Diabetes mellitus   . Hypercholesteremia   . Aortic valve replaced     ALLERGIES:  has No Known Allergies.  MEDICATIONS:  Current Outpatient Prescriptions  Medication Sig Dispense Refill  . aspirin 81 MG tablet Take 81 mg by mouth daily.        . calcium citrate-vitamin D (CITRACAL+D) 315-200 MG-UNIT per tablet Take 1 tablet by mouth daily. Calcium 500mg -200mg  unit      . carvedilol (COREG) 6.25 MG tablet Take 6.25 mg by mouth 2 (two) times daily with a meal.       . cetirizine (ZYRTEC) 10 MG tablet Take 10 mg by mouth as needed for allergies.       . diphenhydrAMINE (BENADRYL) 25 mg capsule Take 25 mg by mouth at bedtime as needed. For sleep      . ferrous sulfate 325 (65 FE) MG tablet Take 325 mg by mouth 2 (two) times daily with a meal. [place]90 [place] [facility] do not cover [facility]gra      . finasteride (PROSCAR) 5 MG tablet Take 5 mg by mouth daily.        . fluocinonide (LIDEX) 0.05 % ointment Apply 1 application topically daily.       . fluticasone (FLONASE) 50 MCG/ACT nasal spray Place 2 sprays  into the nose as needed for allergies.       Marland Kitchen. glipiZIDE (GLUCOTROL) 5 MG tablet Take 5 mg by mouth as needed (diabetes).      Marland Kitchen. HYDROcodone-acetaminophen (NORCO) 5-325 MG per tablet Take 1 tablet by mouth every 6 (six) hours as needed. For pain      . latanoprost (XALATAN) 0.005 % ophthalmic solution Place 1 drop into both eyes at bedtime.        . pantoprazole (PROTONIX) 40 MG tablet Take 40 mg by mouth daily.        . pravastatin (PRAVACHOL) 20 MG tablet Take 20 mg by mouth daily.        Marland Kitchen. pyridoxine (B-6) 50 MG tablet Take 50 mg by mouth 2 (two) times daily.       . Tamsulosin HCl (FLOMAX) 0.4 MG CAPS Take 0.4 mg by mouth at bedtime.       . timolol (BETIMOL) 0.5 % ophthalmic solution Place 1 drop into both eyes daily.       Marland Kitchen. triamcinolone cream (KENALOG) 0.1 % Apply 1 application topically as needed (itching).       No current facility-administered medications for this visit.    SURGICAL HISTORY:  Past Surgical History  Procedure Laterality Date  . Pacemaker insertion      REVIEW  OF SYSTEMS:  A comprehensive review of systems was negative except for: Constitutional: positive for fatigue   PHYSICAL EXAMINATION: General appearance: alert, cooperative, fatigued and no distress Head: Normocephalic, without obvious abnormality, atraumatic Neck: no adenopathy Lymph nodes: Cervical, supraclavicular, and axillary nodes normal. Resp: clear to auscultation bilaterally Cardio: regular rate and rhythm, S1, S2 normal, no murmur, click, rub or gallop GI: soft, non-tender; bowel sounds normal; no masses,  no organomegaly Extremities: extremities normal, atraumatic, no cyanosis or edema  ECOG PERFORMANCE STATUS: 1 - Symptomatic but completely ambulatory  There were no vitals taken for this visit.  LABORATORY DATA: Lab Results  Component Value Date   WBC 4.1 05/27/2013   HGB 10.2* 05/27/2013   HCT 31.0* 05/27/2013   MCV 96.0 05/27/2013   PLT 110* 05/27/2013      Chemistry        Component Value Date/Time   NA 138 04/15/2013 1041   K 4.5 04/15/2013 1041   CL 106 04/15/2013 1041   CO2 28 04/15/2013 1041   BUN 26* 04/15/2013 1041   CREATININE 1.2 04/15/2013 1041      Component Value Date/Time   CALCIUM 8.7 04/15/2013 1041   ALKPHOS 52 12/25/2010 1100   AST 34 12/25/2010 1100   ALT 28 12/25/2010 1100   BILITOT 0.5 12/25/2010 1100     Other lab results: Ferritin 531, serum iron 73, total iron binding capacity 208 and iron saturation 35%.  RADIOGRAPHIC STUDIES: No results found.  ASSESSMENT AND PLAN: This is a very pleasant 80 years old white male with anemia of chronic disease plus/minus deficiency currently on ferrous sulfate 325 mg by mouth twice a day. The patient has persistent anemia but with no significant decline compared to a 6 months ago. I recommend for the patient to continue on ferrous sulfate 325 mg twice a day. I also discussed with the patient again treatment with Aranesp but he still declining to consider this option. The patient has been stable for the last few years with no significant decline in his hemoglobin and hematocrit. I recommended for him to follow up with his primary care physician at regular basis. I would see him as needed basis at this point.  He was advised to call immediately if he has any concerning symptoms in the interval.  The patient voices understanding of current disease status and treatment options and is in agreement with the current care plan.  All questions were answered. The patient knows to call the clinic with any problems, questions or concerns. We can certainly see the patient much sooner if necessary.  Disclaimer: This note was dictated with voice recognition software. Similar sounding words can inadvertently be transcribed and may not be corrected upon review.

## 2013-06-05 ENCOUNTER — Other Ambulatory Visit: Payer: Self-pay

## 2013-06-05 ENCOUNTER — Emergency Department (HOSPITAL_COMMUNITY): Payer: Non-veteran care

## 2013-06-05 ENCOUNTER — Emergency Department (HOSPITAL_COMMUNITY)
Admission: EM | Admit: 2013-06-05 | Discharge: 2013-06-05 | Disposition: A | Discharge status: Home / Self Care | Payer: Non-veteran care | Attending: Emergency Medicine | Admitting: Emergency Medicine

## 2013-06-05 ENCOUNTER — Encounter (HOSPITAL_COMMUNITY): Payer: Self-pay | Admitting: Emergency Medicine

## 2013-06-05 DIAGNOSIS — E119 Type 2 diabetes mellitus without complications: Secondary | ICD-10-CM | POA: Insufficient documentation

## 2013-06-05 DIAGNOSIS — W010XXA Fall on same level from slipping, tripping and stumbling without subsequent striking against object, initial encounter: Secondary | ICD-10-CM | POA: Insufficient documentation

## 2013-06-05 DIAGNOSIS — Z7982 Long term (current) use of aspirin: Secondary | ICD-10-CM | POA: Insufficient documentation

## 2013-06-05 DIAGNOSIS — Z79899 Other long term (current) drug therapy: Secondary | ICD-10-CM | POA: Insufficient documentation

## 2013-06-05 DIAGNOSIS — Z950 Presence of cardiac pacemaker: Secondary | ICD-10-CM | POA: Insufficient documentation

## 2013-06-05 DIAGNOSIS — Y92009 Unspecified place in unspecified non-institutional (private) residence as the place of occurrence of the external cause: Secondary | ICD-10-CM | POA: Insufficient documentation

## 2013-06-05 DIAGNOSIS — E7800 Pure hypercholesterolemia, unspecified: Secondary | ICD-10-CM | POA: Insufficient documentation

## 2013-06-05 DIAGNOSIS — Y9389 Activity, other specified: Secondary | ICD-10-CM | POA: Insufficient documentation

## 2013-06-05 DIAGNOSIS — Z954 Presence of other heart-valve replacement: Secondary | ICD-10-CM | POA: Insufficient documentation

## 2013-06-05 DIAGNOSIS — S32509A Unspecified fracture of unspecified pubis, initial encounter for closed fracture: Secondary | ICD-10-CM | POA: Insufficient documentation

## 2013-06-05 DIAGNOSIS — S32511A Fracture of superior rim of right pubis, initial encounter for closed fracture: Secondary | ICD-10-CM

## 2013-06-05 LAB — PROTIME-INR
INR: 1.05 (ref 0.00–1.49)
Prothrombin Time: 13.5 seconds (ref 11.6–15.2)

## 2013-06-05 LAB — BASIC METABOLIC PANEL
BUN: 29 mg/dL — AB (ref 6–23)
CALCIUM: 9.1 mg/dL (ref 8.4–10.5)
CO2: 23 mEq/L (ref 19–32)
Chloride: 103 mEq/L (ref 96–112)
Creatinine, Ser: 1.14 mg/dL (ref 0.50–1.35)
GFR, EST AFRICAN AMERICAN: 66 mL/min — AB (ref >90)
GFR, EST NON AFRICAN AMERICAN: 57 mL/min — AB (ref >90)
GLUCOSE: 178 mg/dL — AB (ref 70–99)
Potassium: 4.7 mEq/L (ref 3.7–5.3)
SODIUM: 138 meq/L (ref 137–147)

## 2013-06-05 LAB — CBC
HEMATOCRIT: 30.6 % — AB (ref 39.0–52.0)
HEMOGLOBIN: 10.5 g/dL — AB (ref 13.0–17.0)
MCH: 32.4 pg (ref 26.0–34.0)
MCHC: 34.3 g/dL (ref 30.0–36.0)
MCV: 94.4 fL (ref 78.0–100.0)
Platelets: 125 10*3/uL — ABNORMAL LOW (ref 150–400)
RBC: 3.24 MIL/uL — ABNORMAL LOW (ref 4.22–5.81)
RDW: 12.6 % (ref 11.5–15.5)
WBC: 5.2 10*3/uL (ref 4.0–10.5)

## 2013-06-05 MED ORDER — HYDROMORPHONE HCL PF 1 MG/ML IJ SOLN
1.0000 mg | Freq: Once | INTRAMUSCULAR | Status: AC
  Administered 2013-06-05: 0.5 mg via INTRAVENOUS
  Filled 2013-06-05: qty 1

## 2013-06-05 MED ORDER — FENTANYL CITRATE 0.05 MG/ML IJ SOLN
100.0000 ug | Freq: Once | INTRAMUSCULAR | Status: AC
  Administered 2013-06-05: 50 ug via INTRAVENOUS
  Filled 2013-06-05: qty 2

## 2013-06-05 MED ORDER — OXYCODONE-ACETAMINOPHEN 5-325 MG PO TABS: ORAL_TABLET | ORAL | Status: DC

## 2013-06-05 MED ORDER — FENTANYL CITRATE 0.05 MG/ML IJ SOLN: 50.0000 ug | Freq: Once | INTRAMUSCULAR | Status: DC

## 2013-06-05 NOTE — ED Provider Notes (Signed)
8:31 PM Pt signed out to me by Elwin Mocha at shift change, pt is to be admitted for pain control and observation.  Dr. Gwendolyn Grant consulted with Orthopedics, Dr. Eulah Pont, who agrees to see follow up with pt in the morning.    Will consult with [facility] to admit pt.  [facility] stated pt would have to be placed in skilled nursing tomorrow for rehab as pt's fracture is non-surgical.  Advised to discuss with pt before admitting as pt's alternative option would be pain medication with discharge home.   Discussed options of admitting pt as well as sending home with pain medication and orthopedic follow up.  Pt and daughter stated they prefer to be discharged home with pain medication as pt's wife would be home alone. Pt's daughter states she lives right next door and will be able to assist patient at home if needed.  Advised pt to call Dr. Greig Right office on Monday, 2/9, to discuss follow up and possible need for outpatient rehab. Pt does have walker at home.  Return precautions given.  Pt is being taken home via [facility]. Pt and daughter verbalized understanding and agreement with tx plan.      Junius Finner, PA-C 06/05/13 2141

## 2013-06-05 NOTE — ED Notes (Signed)
Pt. States he fell on right hip. C/o right hip pain and left leg/groin pain.

## 2013-06-05 NOTE — ED Notes (Signed)
Gave pt another of fentanyl due to pt's pain level 10/10.

## 2013-06-05 NOTE — ED Provider Notes (Signed)
CSN: [id number]     Arrival date & time 06/05/13  1850 History   First MD Initiated Contact with Patient 06/05/13 1850     Chief Complaint  Patient presents with  . Fall  . Hip Pain   (Consider location/radiation/quality/duration/timing/severity/associated sxs/prior Treatment) Patient is a 80 y.o. male presenting with fall and hip pain. The history is provided by the patient.  Fall This is a new problem. The current episode started 1 to 2 hours ago. Episode frequency: once. The problem has not changed since onset.Pertinent negatives include no chest pain, no abdominal pain and no shortness of breath. Nothing aggravates the symptoms. Nothing relieves the symptoms.  Hip Pain Pertinent negatives include no chest pain, no abdominal pain and no shortness of breath.    Past Medical History  Diagnosis Date  . Diabetes mellitus   . Hypercholesteremia   . Aortic valve replaced    Past Surgical History  Procedure Laterality Date  . Pacemaker insertion     No family history on file. History  Substance Use Topics  . Smoking status: Never Smoker   . Smokeless tobacco: Not on file  . Alcohol Use: No    Review of Systems  Constitutional: Negative for fever, chills and diaphoresis.  Respiratory: Negative for cough and shortness of breath.   Cardiovascular: Negative for chest pain.  Gastrointestinal: Negative for vomiting and abdominal pain.  All other systems reviewed and are negative.    Allergies  Review of patient's allergies indicates no known allergies.  Home Medications   Current Outpatient Rx  Name  Route  Sig  Dispense  Refill  . aspirin 81 MG tablet   Oral   Take 81 mg by mouth daily.           . calcium citrate-vitamin D (CITRACAL+D) 315-200 MG-UNIT per tablet   Oral   Take 1 tablet by mouth daily. Calcium 500mg -200mg  unit         . carvedilol (COREG) 6.25 MG tablet   Oral   Take 6.25 mg by mouth 2 (two) times daily with a meal.          . cetirizine  (ZYRTEC) 10 MG tablet   Oral   Take 10 mg by mouth as needed for allergies.          . diphenhydrAMINE (BENADRYL) 25 mg capsule   Oral   Take 25 mg by mouth at bedtime as needed. For sleep         . ferrous sulfate 325 (65 FE) MG tablet   Oral   Take 325 mg by mouth 2 (two) times daily with a meal. #90 called in to [place] [facility]-they do not cover Integra         . finasteride (PROSCAR) 5 MG tablet   Oral   Take 5 mg by mouth daily.           . fluocinonide (LIDEX) 0.05 % ointment   Topical   Apply 1 application topically daily.          . fluticasone (FLONASE) 50 MCG/ACT nasal spray   Nasal   Place 2 sprays into the nose as needed for allergies.          Marland Kitchen glipiZIDE (GLUCOTROL) 5 MG tablet   Oral   Take 5 mg by mouth as needed (diabetes).         Marland Kitchen HYDROcodone-acetaminophen (NORCO) 5-325 MG per tablet   Oral   Take 1 tablet by mouth every 6 (six)  hours as needed. For pain         . latanoprost (XALATAN) 0.005 % ophthalmic solution   Both Eyes   Place 1 drop into both eyes at bedtime.           . pantoprazole (PROTONIX) 40 MG tablet   Oral   Take 40 mg by mouth daily.           . pravastatin (PRAVACHOL) 20 MG tablet   Oral   Take 20 mg by mouth daily.           Marland Kitchen. pyridoxine (B-6) 50 MG tablet   Oral   Take 50 mg by mouth 2 (two) times daily.          . Tamsulosin HCl (FLOMAX) 0.4 MG CAPS   Oral   Take 0.4 mg by mouth at bedtime.          . timolol (BETIMOL) 0.5 % ophthalmic solution   Both Eyes   Place 1 drop into both eyes daily.          Marland Kitchen. triamcinolone cream (KENALOG) 0.1 %   Topical   Apply 1 application topically as needed (itching).          Pulse 94  Temp(Src) 97.8 F (36.6 C) (Oral)  Resp 19  SpO2 98% Physical Exam  Nursing note and vitals reviewed. Constitutional: He is oriented to person, place, and time. He appears well-developed and well-nourished. No distress.  HENT:  Head: Normocephalic and atraumatic.   Mouth/Throat: No oropharyngeal exudate.  Eyes: EOM are normal. Pupils are equal, round, and reactive to light.  Neck: Normal range of motion. Neck supple.  Cardiovascular: Normal rate and regular rhythm.  Exam reveals no friction rub.   No murmur heard. Pulmonary/Chest: Effort normal and breath sounds normal. No respiratory distress. He has no wheezes. He has no rales.  Abdominal: He exhibits no distension. There is no tenderness. There is no rebound.  Musculoskeletal: He exhibits no edema.       Left hip: He exhibits decreased range of motion, tenderness and bony tenderness.  L leg shortened  Neurological: He is alert and oriented to person, place, and time.  Skin: He is not diaphoretic.    ED Course  Procedures (including critical care time) Labs Review Labs Reviewed  CBC  BASIC METABOLIC PANEL  PROTIME-INR   Imaging Review Dg Hip Complete Right  06/05/2013   CLINICAL DATA:  Fall, left hip pain  EXAM: RIGHT HIP - COMPLETE 2+ VIEW  COMPARISON:  None.  FINDINGS: Fracture noted through the right superior pubic ramus. No additional acute bony abnormality seen. Mild degenerative changes in the hips bilaterally. No evidence of femoral neck fracture. No subluxation or dislocation.  IMPRESSION: Right superior pubic ramus fracture.   Electronically Signed   By: Charlett NoseKevin  Dover M.D.   On: 06/05/2013 19:55    EKG Interpretation   None       Date: 06/05/2013  Rate: 76  Rhythm: normal sinus rhythm  QRS Axis: left  Intervals: normal  ST/T Wave abnormalities: normal  Conduction Disutrbances:none  Narrative Interpretation:   Old EKG Reviewed: unchanged Paced, similar to prior  MDM   1. Fracture of right superior pubic ramus    86M fell at home. Slipped getting something off the fridge. Landed on the right, having severe L sided hip pain. Pain in L groin. Unable to move L leg. L leg shortened. Pulses intact, sensation and motor function intact. Belly benign, no spinal tenderness.  Will  obtain pelvic films, concern for pelvic fracture and/or hip fracture. Xray shows superior pubic rami fracture on the right. Dr. Eulah Pont with Ortho stated these are painful and will likely need rehab. Patient cannot move without pain. Will admit. PCP is [facility].  Dagmar Hait, MD 06/05/13 2030

## 2013-06-05 NOTE — ED Notes (Signed)
Pt waiting on [facility] to transfer him home due to inability to ambulate secondary to right pelvic fracture.

## 2013-06-05 NOTE — ED Notes (Signed)
Per EMS, pt slipped and fell on left hip. No LOC. Pt. C/o left hip pain with radiation to left groin. Pedal pulses intact. Pain with movement. Pelvic binding applied. Pt. Given 100 mcg of fentanyl

## 2013-06-05 NOTE — Consult Note (Signed)
I have reviewed the images and discussed case with the ED team.  I will perform complete Consult in the morning of Sunday 2/8  Tentative Plan will be for WBAT, pain control and mobilization with likely limited mobility for several weeks. May need AIR vs SNF depending on his social situation

## 2013-06-05 NOTE — Discharge Instructions (Signed)
Stable Pelvic Fracture, Adult °You have one or more fractures (this means there is a break in the bones) of the pelvis. The pelvis is the ring of bones that make up your hipbones. These are the bones you sit on and the lower part of the spine. It is like a boney ring where your legs attach and which supports your upper body. You have an un-displaced fracture. This means the bones are in good position. The pelvic fracture you have is a simple (uncomplicated) fracture. °DIAGNOSIS  °X-rays usually diagnose these fractures. °TREATMENT  °The goals of treating pelvic fractures are to get the bones to heal in a good position. The patient should return to normal activities as soon as possible. Such fractures are often treated with normal bed rest and conservative measures.  °HOME CARE INSTRUCTIONS  °· You should be on bed rest for as long as directed by your caregiver. Change positions of your legs every 1-2 hours to maintain good blood flow. You may sit as long as is tolerable. Following this, you may do usual activities, but avoid strenuous activities for as long as directed by your caregiver. °· Only take over-the-counter or prescription medicines for pain, discomfort, or fever as directed by your caregiver. °· Bed-rest may also be used for discomfort. °· Resume your activities when you are able. Use a cain or crutch on the injured side to reduce pain while walking, as needed. °· If you develop increased pain or discomfort not relieved with medications, contact your caregiver. °· Warning: Do not drive a car or operate a motor vehicle until your caregiver specifically tells you it is safe to do so. °SEEK IMMEDIATE MEDICAL CARE IF:  °· You feel light-headed or faint, develop chest pain or shortness of breath. °· An unexplained oral temperature above 102° F (38.9° C) develops. °· You develop blood in the urine or in the stools. °· There is difficulty urinating, and/or having a bowel movement, or pain with these  efforts. °· There is a difficulty or increased pain with walking. °· There is swelling in one or both legs that is not normal. °Document Released: 06/24/2001 Document Revised: 12/16/2012 Document Reviewed: 11/27/2007 °[employer]® Patient Information ©2014 [employer]. ° °

## 2013-06-05 NOTE — ED Notes (Signed)
Patient transported to X-ray 

## 2013-06-06 NOTE — ED Provider Notes (Signed)
Medical screening examination/treatment/procedure(s) were conducted as a shared visit with non-physician practitioner(s) and myself.  I personally evaluated the patient during the encounter.  EKG Interpretation   None        Patient here with fall, R superior pubic rami fracture. Having difficult time with pain control. Junius Finner to discuss options with [facility]. See my note for full H&P  Dagmar Hait, MD 06/06/13 1112

## 2013-07-07 ENCOUNTER — Encounter: Payer: Self-pay | Admitting: Cardiology

## 2013-07-07 ENCOUNTER — Telehealth: Payer: Self-pay | Admitting: *Deleted

## 2013-07-07 NOTE — Telephone Encounter (Signed)
Agree with plan. Thank you

## 2013-07-07 NOTE — Telephone Encounter (Signed)
Received my chart message from pt's daughter [name]) about pt having difficulty breathing. I placed call to [facility] to get more information about pt. Left message on home and cell numbers to call back. I replied to my chart message from [facility] that I would call her to discuss pt's symptoms.

## 2013-07-07 NOTE — Telephone Encounter (Signed)
Spoke with pt's daughter. She reports upon talking more with pt he is complaining of nasal stuffiness and allergy symptoms.  Daughter thinks this may be cause of difficulty breathing when lying down the last 2 nights.  Pt is able to ambulate without shortness of breath. Daughter has not noticed any swelling or weight change.  Pt does not weigh daily but daughter will have him start to do this. They will call our office if weight gain of 3 lbs or greater in 24 hours.  Daughter states she is going to have pt try Benadryl to see if this helps with nasal stuffiness.  She will call us if symptoms do not improve.

## 2013-10-04 ENCOUNTER — Encounter: Payer: Self-pay | Admitting: Cardiology

## 2013-10-05 ENCOUNTER — Telehealth: Payer: Self-pay | Admitting: Internal Medicine

## 2013-10-05 NOTE — Telephone Encounter (Signed)
10-05-13 sent certified letter regarding past due device check/mt ° °

## 2013-10-19 ENCOUNTER — Encounter: Payer: Self-pay | Admitting: Internal Medicine

## 2014-04-18 ENCOUNTER — Encounter: Payer: Self-pay | Admitting: Internal Medicine

## 2014-04-19 ENCOUNTER — Telehealth: Payer: Self-pay | Admitting: Cardiology

## 2014-04-19 NOTE — Telephone Encounter (Signed)
Spoke w/ pt and received daughter number. Called daughter and informed her that our office is aware that pt is followed by [place] and that the last letter was sent out in June 2015. Pt should not receive any more letters. Pt daughter verbalized understanding.

## 2014-06-30 ENCOUNTER — Ambulatory Visit (INDEPENDENT_AMBULATORY_CARE_PROVIDER_SITE_OTHER): Payer: Medicare Other | Admitting: Cardiology

## 2014-06-30 ENCOUNTER — Encounter: Payer: Self-pay | Admitting: Cardiology

## 2014-06-30 VITALS — BP 108/72 | HR 70 | Ht 73.0 in | Wt 161.0 lb

## 2014-06-30 DIAGNOSIS — Z950 Presence of cardiac pacemaker: Secondary | ICD-10-CM

## 2014-06-30 DIAGNOSIS — E7800 Pure hypercholesterolemia, unspecified: Secondary | ICD-10-CM

## 2014-06-30 DIAGNOSIS — Z954 Presence of other heart-valve replacement: Secondary | ICD-10-CM

## 2014-06-30 DIAGNOSIS — I5022 Chronic systolic (congestive) heart failure: Secondary | ICD-10-CM

## 2014-06-30 DIAGNOSIS — Z952 Presence of prosthetic heart valve: Secondary | ICD-10-CM

## 2014-06-30 DIAGNOSIS — Z951 Presence of aortocoronary bypass graft: Secondary | ICD-10-CM

## 2014-06-30 NOTE — Progress Notes (Signed)
[address] [place], [place]  [address] Phone: [phone] Fax:  [phone]  Date:  06/30/2014   ID:  Joseph Watts, DOB 1926/09/21, MRN [id number]  PCP:  Joseph Penna, MD   History of Present Illness: Joseph Watts is a 81 y.o. male status post TAVR X 2 at [facility]. Ejection fraction 35%. ICD-Bi-V.  After second valve procedure his central aortic regurgitation jet has resolved. He still has some regurgitation perivalvular. Notes reviewed.  Pancytopenia followed by Dr. Shirline Watts.  Left lower extremity edema resolved. He is off of Lasix, discontinued by [facility]. This was likely secondary to chronic kidney disease. I encouraged use of compression hose.  At one point I was concerned about hypotension however his carvedilol is now back up to 6.25. He is doing well. [facility] this.  01/01/13 - mild SOB increased. Weight stable. No CP. Good UOP tried a few days of Lasix. Helped.  04/15/13-currently doing very well. Walking to [employer] without much difficulty. Still short of breath however. No changes made.  06/30/14 -new dx of PAF on coumadin. Longest episode was 4 days. Mild SOB at times with ambulation however this is intermittent. [facility] INR 2.1 last check. No signs of bleeding. Chronic pancytopenia. Asked him to check with [facility] whether or not he needs to continue aspirin 81 mg. Weight loss. Discussed continuing with protein.    Wt Readings from Last 3 Encounters:  06/30/14 161 lb (73.029 kg)  06/03/13 169 lb 14.4 oz (77.066 kg)  04/15/13 168 lb 3.2 oz (76.295 kg)     Past Medical History  Diagnosis Date  . Diabetes mellitus   . Hypercholesteremia   . Aortic valve replaced     Past Surgical History  Procedure Laterality Date  . Pacemaker insertion      Current Outpatient Prescriptions  Medication Sig Dispense Refill  . aspirin 81 MG tablet Take 81 mg by mouth daily.      . calcium-vitamin D (OSCAL WITH D)  500-200 MG-UNIT per tablet Take by mouth.    . carvedilol (COREG) 6.25 MG tablet Take 6.25 mg by mouth 2 (two) times daily with a meal.     . cetirizine (ZYRTEC) 10 MG tablet Take 10 mg by mouth as needed for allergies.     . diphenhydrAMINE (BENADRYL) 25 mg capsule Take 25 mg by mouth at bedtime as needed. For sleep    . ferrous sulfate 325 (65 FE) MG tablet Take 325 mg by mouth 2 (two) times daily with a meal.     . finasteride (PROSCAR) 5 MG tablet Take 5 mg by mouth daily.      . fluocinonide (LIDEX) 0.05 % ointment Apply 1 application topically daily.     . fluticasone (FLONASE) 50 MCG/ACT nasal spray Place 2 sprays into the nose as needed for allergies.     Marland Kitchen glipiZIDE (GLUCOTROL) 5 MG tablet Take 5 mg by mouth as needed (diabetes).    Marland Kitchen HYDROcodone-acetaminophen (NORCO) 5-325 MG per tablet Take 1 tablet by mouth every 6 (six) hours as needed. For pain    . latanoprost (XALATAN) 0.005 % ophthalmic solution Place 1 drop into both eyes at bedtime.      . Multiple Vitamins-Minerals (PRESERVISION AREDS 2 PO) Take by mouth.    . pantoprazole (PROTONIX) 40 MG tablet Take 40 mg by mouth daily.      . polyethylene glycol (MIRALAX / GLYCOLAX) packet Take by mouth.    Marland Kitchen  pravastatin (PRAVACHOL) 20 MG tablet Take 20 mg by mouth daily.      Marland Kitchen pyridoxine (B-6) 50 MG tablet Take 50 mg by mouth 2 (two) times daily.     . Tamsulosin HCl (FLOMAX) 0.4 MG CAPS Take 0.4 mg by mouth at bedtime.     . timolol (BETIMOL) 0.5 % ophthalmic solution Place 1 drop into both eyes daily.     Marland Kitchen triamcinolone cream (KENALOG) 0.1 % Apply 1 application topically as needed (itching).    . warfarin (COUMADIN) 2 MG tablet Take 2 mg by mouth daily. Monday, Wednesday, Friday, Saturday    . warfarin (COUMADIN) 3 MG tablet Take 3 mg by mouth daily. Sunday, Tuesday, Thursday     No current facility-administered medications for this visit.    Allergies:   No Known Allergies  Social History:  The patient  reports that he has  never smoked. He does not have any smokeless tobacco history on file. He reports that he does not drink alcohol or use illicit drugs.   ROS:  Please see the history of present illness.   No syncope, no bleeding, no orthopnea, no PND, no chest pain    PHYSICAL EXAM: VS:  BP 108/72 mmHg  Pulse 70  Ht  (1.854 m)  Wt 161 lb (73.029 kg)  BMI 21.25 kg/m2 Well nourished, well developed, in no acute distress HEENT: normal Neck: no JVD Cardiac:  normal S1, S2; RRR; 1/6 diastolic murmur RUSB Lungs:  clear to auscultation bilaterally, no wheezing, rhonchi or rales Abd: soft, nontender, no hepatomegaly Ext: no edema Skin: warm and dry Neuro: no focal abnormalities noted  EKG:  06/30/14-AV pacing, 70              no change                                                                                                                                           Echocardiogram  2/14 at [facility]-EF 45%, mild aortic regurgitation, mild LVH  ASSESSMENT AND PLAN:  1. Chronic systolic heart failure-ejection fraction has improved with biventricular pacing. EF currently approximately 35-45%. This was at [facility]. We will continue with carvedilol. Unable to up titrate do to hypotension previously. Currently 6.25 twice a day. No longer utilizing furosemide. Chronic kidney disease noted. No ACE inhibitor because of chronic kidney disease and hypertension. 2. Chronic kidney disease stage III-[facility] has been following.  3. Biventricular ICD-this is interrogated at [facility]. 4. Aortic insufficiency-mild to moderate post TAVR, can hear on exam. 5. We will see back in 12 months.  Signed, Joseph Schultz, MD [facility]  06/30/2014 10:09 AM

## 2014-06-30 NOTE — Patient Instructions (Signed)
The current medical regimen is effective;  continue present plan and medications.  Follow up in 1 year with Dr. Skains.  You will receive a letter in the mail 2 months before you are due.  Please call us when you receive this letter to schedule your follow up appointment.  Thank you for choosing [facility]!!     

## 2015-03-17 ENCOUNTER — Encounter: Payer: Self-pay | Admitting: Cardiology

## 2015-03-20 ENCOUNTER — Encounter: Payer: Self-pay | Admitting: Cardiology

## 2015-03-20 ENCOUNTER — Encounter: Payer: Self-pay | Admitting: *Deleted

## 2015-03-20 ENCOUNTER — Ambulatory Visit (INDEPENDENT_AMBULATORY_CARE_PROVIDER_SITE_OTHER): Payer: Medicare Other | Admitting: Cardiology

## 2015-03-20 VITALS — BP 160/64 | HR 84 | Ht 73.0 in | Wt 148.0 lb

## 2015-03-20 DIAGNOSIS — Z954 Presence of other heart-valve replacement: Secondary | ICD-10-CM

## 2015-03-20 DIAGNOSIS — Z950 Presence of cardiac pacemaker: Secondary | ICD-10-CM

## 2015-03-20 DIAGNOSIS — I351 Nonrheumatic aortic (valve) insufficiency: Secondary | ICD-10-CM

## 2015-03-20 DIAGNOSIS — R64 Cachexia: Secondary | ICD-10-CM | POA: Diagnosis not present

## 2015-03-20 DIAGNOSIS — I429 Cardiomyopathy, unspecified: Secondary | ICD-10-CM

## 2015-03-20 DIAGNOSIS — Z951 Presence of aortocoronary bypass graft: Secondary | ICD-10-CM

## 2015-03-20 DIAGNOSIS — Z952 Presence of prosthetic heart valve: Secondary | ICD-10-CM

## 2015-03-20 NOTE — Progress Notes (Signed)
Cardiology Office Note   Date:  03/20/2015   ID:  Joseph Watts, DOB 09-Apr-1927, MRN [id number]  PCP:  Alysia Penna, MD  Cardiologist:  Dr. Anne Fu    Chief Complaint  Patient presents with  . Weight Loss      History of Present Illness: Joseph Watts is a 81 y.o. male who presents for 30 lb wt loss in 7 weeks, possibly due to cardiomyopathy.   He has hx of status post TAVR X 2 at [facility]. Ejection fraction 35%. ICD-Bi-V.  After second valve procedure his central aortic regurgitation jet has resolved. He still has some regurgitation perivalvular.  He has Pancytopenia followed by Dr. Shirline Frees.   06/30/14 -new dx of PAF on coumadin. Longest episode was 4 days. Mild SOB at times with ambulation however this is intermittent. [facility] INR 2.1 per pt last check.  He has a hx of weight loss. Dr. Anne Fu discussed continuing with protein.  Today he complains of brief shooting chest pain lasts a second.  This occurs rarely. He has continued to loose wt.  His [facility] docs believe it is related to his cardiomyopathy.  He is now being seen at the [facility].  He has not been using supplements. His PCP has drawn labs and daughter believes Hgb 9.8.    Past Medical History  Diagnosis Date  . Diabetes mellitus   . Hypercholesteremia   . Aortic valve replaced     Past Surgical History  Procedure Laterality Date  . Pacemaker insertion       Current Outpatient Prescriptions  Medication Sig Dispense Refill  . carvedilol (COREG) 25 MG tablet Take 25 mg by mouth 2 (two) times daily with a meal.    . cetirizine (ZYRTEC) 10 MG tablet Take 10 mg by mouth as needed for allergies.     . diphenhydrAMINE (BENADRYL) 25 mg capsule Take 25 mg by mouth at bedtime as needed. For sleep    . dorzolamide-timolol (COSOPT) 22.3-6.8 MG/ML ophthalmic solution Place 1 drop into both eyes 2 (two) times daily.    . ferrous sulfate 325 (65 FE) MG tablet Take 325 mg by mouth daily with  breakfast.     . finasteride (PROSCAR) 5 MG tablet Take 5 mg by mouth daily.      . fluocinonide (LIDEX) 0.05 % ointment Apply 1 application topically daily.     . fluticasone (FLONASE) 50 MCG/ACT nasal spray Place 2 sprays into the nose as needed for allergies.     Marland Kitchen latanoprost (XALATAN) 0.005 % ophthalmic solution Place 1 drop into both eyes at bedtime.      . Multiple Vitamins-Minerals (PRESERVISION/LUTEIN PO) Take 1 capsule by mouth daily.    . pantoprazole (PROTONIX) 40 MG tablet Take 40 mg by mouth daily.      . polyethylene glycol (MIRALAX / GLYCOLAX) packet Take by mouth.    . pravastatin (PRAVACHOL) 20 MG tablet Take 20 mg by mouth daily.      . Tamsulosin HCl (FLOMAX) 0.4 MG CAPS Take 0.4 mg by mouth at bedtime.     . triamcinolone cream (KENALOG) 0.1 % Apply 1 application topically as needed (itching).    Marland Kitchen VITAMIN K PO Take 10 mcg by mouth daily.    Marland Kitchen warfarin (COUMADIN) 2 MG tablet Take 2 mg by mouth daily. Monday, Wednesday, Friday, Saturday    . warfarin (COUMADIN) 3 MG tablet Take 3 mg by mouth daily. Sunday, Tuesday, Thursday     No current facility-administered  medications for this visit.    Allergies:   Review of patient's allergies indicates no known allergies.    Social History:  The patient  reports that he has never smoked. He does not have any smokeless tobacco history on file. He reports that he does not drink alcohol or use illicit drugs.   Family History:  The patient's family history is not on file.  Unable to give any details today and not significant to todays visit.    ROS:  General:no colds or fevers, continued weight decrease, no appetite. He had one fall without injury- no ICD discharges, ICD BiV followed at [facility] Skin:no rashes or ulcers HEENT:no blurred vision, no congestion CV:see HPI PUL:see HPI GI:no diarrhea constipation or melena, no indigestion GU:no hematuria, no dysuria MS:no joint pain, no claudication Neuro:no syncope, no  lightheadedness Endo:+ diabetes, no thyroid disease  Wt Readings from Last 3 Encounters:  03/20/15 148 lb (67.132 kg)  06/30/14 161 lb (73.029 kg)  06/03/13 169 lb 14.4 oz (77.066 kg)     PHYSICAL EXAM: VS:  BP 160/64 mmHg  Pulse 84  Ht  (1.854 m)  Wt 148 lb (67.132 kg)  BMI 19.53 kg/m2 , BMI Body mass index is 19.53 kg/(m^2). General:Pleasant affect, NAD, but holds head down as if too weak to raise his head Skin:Warm and dry, brisk capillary refill HEENT:normocephalic, sclera clear, mucus membranes moist Neck:supple, no JVD sitting up right, no bruits  Heart:S1S2 RRR with 2/6 diastolic murmur,  No gallup, rub or click Lungs:clear without rales, rhonchi, or wheezes UYQ:IHKV, non tender, + BS, do not palpate liver spleen or masses Ext:no lower ext edema, 2+ pedal pulses, 2+ radial pulses Neuro:alert and oriented X 3, MAE, follows commands, + facial symmetry    EKG:  EKG is ordered today. The ekg ordered today demonstrates SR with BiV pacing   Recent Labs: No results found for requested labs within last 365 days.   Per the [facility]  Lipid Panel No results found for: CHOL, TRIG, HDL, CHOLHDL, VLDL, LDLCALC, LDLDIRECT     Other studies Reviewed: Additional studies/ records that were reviewed today include: reviewed previous echo. Previous notes and emails from daughter. .   ASSESSMENT AND PLAN:  1. Increasing wt loss perhaps due to cardiac cachexia- will check echo- ASAP  2. Hx of TAVR X 2 at [facility] + diastolic murmur aortic insufficiency   3. PAF on coumadin followed at [facility], therapeutic INR. In SR today, I have asked them to send in interrogation of his ICD BIVearly to make sure no increase of a fib.  4. CKD 3 followed at [facility] no ACE/ARB  5. Biventricular ICD, followed at [facility]  6. CAD with hx CABG brief shooting chest pain, no EKG changes, if drop in EF would consider lexiscan myoview would discuss with Dr. Anne Fu.     Current medicines are reviewed with the  patient today.  The patient Has no concerns regarding medicines.  The following changes have been made:  See above Labs/ tests ordered today include:see above  Disposition:   FU:  see above  Nyoka Lint, NP  03/20/2015 3:45 PM    [facility] [address] [place], [place], [place]  [address]/ [address] [place], [place] Phone: [phone]; Fax: [phone]  [phone]

## 2015-03-20 NOTE — Patient Instructions (Signed)
Medication Instructions:  None  Labwork: None  Testing/Procedures: Your physician has requested that you have an echocardiogram. Echocardiography is a painless test that uses sound waves to create images of your heart. It provides your doctor with information about the size and shape of your heart and how well your heart's chambers and valves are working. This procedure takes approximately one hour. There are no restrictions for this procedure.   Follow-Up: Your physician recommends that you schedule a follow-up appointment next available with Dr. Anne Fu.  Any Other Special Instructions Will Be Listed Below (If Applicable).  Your physician recommends that you start using Ensure.   If you need a refill on your cardiac medications before your next appointment, please call your pharmacy.

## 2015-03-27 ENCOUNTER — Ambulatory Visit: Payer: Medicare Other | Admitting: Physician Assistant

## 2015-03-28 ENCOUNTER — Encounter: Payer: Self-pay | Admitting: Cardiology

## 2015-03-31 ENCOUNTER — Other Ambulatory Visit (HOSPITAL_COMMUNITY): Payer: Medicare Other

## 2015-04-03 NOTE — Addendum Note (Signed)
Addended by: Micki Riley C on: 04/03/2015 02:46 PM   Modules accepted: Orders

## 2015-05-05 ENCOUNTER — Encounter: Payer: Self-pay | Admitting: Cardiology

## 2015-05-05 ENCOUNTER — Ambulatory Visit (INDEPENDENT_AMBULATORY_CARE_PROVIDER_SITE_OTHER): Payer: Commercial Managed Care - HMO | Admitting: Cardiology

## 2015-05-05 VITALS — BP 134/58 | HR 90 | Ht 72.0 in | Wt 145.1 lb

## 2015-05-05 DIAGNOSIS — Z951 Presence of aortocoronary bypass graft: Secondary | ICD-10-CM

## 2015-05-05 DIAGNOSIS — I5022 Chronic systolic (congestive) heart failure: Secondary | ICD-10-CM

## 2015-05-05 DIAGNOSIS — Z954 Presence of other heart-valve replacement: Secondary | ICD-10-CM

## 2015-05-05 DIAGNOSIS — Z950 Presence of cardiac pacemaker: Secondary | ICD-10-CM

## 2015-05-05 DIAGNOSIS — E7800 Pure hypercholesterolemia, unspecified: Secondary | ICD-10-CM

## 2015-05-05 DIAGNOSIS — Z952 Presence of prosthetic heart valve: Secondary | ICD-10-CM

## 2015-05-05 NOTE — Patient Instructions (Signed)
Medication Instructions:  The current medical regimen is effective;  continue present plan and medications.  Follow-Up: Please re-establish with Dr [facility].  Follow up in 6 months with Dr. Anne Fu.  You will receive a letter in the mail 2 months before you are due.  Please call us when you receive this letter to schedule your follow up appointment.   If you need a refill on your cardiac medications before your next appointment, please call your pharmacy.  Thank you for choosing [facility]!!

## 2015-05-05 NOTE — Progress Notes (Signed)
[address] [place], [place]  [address] Phone: [phone] Fax:  [phone]  Date:  05/05/2015   ID:  Millie Arslan, DOB 12-Sep-1926, MRN [id number]  PCP:  Alysia Penna, MD   History of Present Illness: Joseph Watts is a 82 y.o. male status post TAVR X 2 at [facility]. Ejection fraction 35%. ICD-Bi-V.  After second valve procedure his central aortic regurgitation jet has resolved. He still has some regurgitation perivalvular. Notes reviewed. Dr. Romeo Apple.   Pancytopenia followed by Dr. Shirline Frees.  Left lower extremity edema resolved. He is off of Lasix, discontinued by [facility]. This was likely secondary to chronic kidney disease. I encouraged use of compression hose.  At one point I was concerned about hypotension however his carvedilol is now back up to 6.25. He is doing well. His [facility] physician increased this.  01/01/13 - mild SOB increased. Weight stable. No CP. Good UOP tried a few days of Lasix. Helped.  04/15/13-currently doing very well. Walking to [employer] without much difficulty. Still short of breath however. No changes made.  06/30/14 -new dx of PAF on coumadin. Longest episode was 4 days. Mild SOB at times with ambulation however this is intermittent. [facility]. INR 2.1 last check. No signs of bleeding. Chronic pancytopenia. Asked him to check with [facility] whether or not he needs to continue aspirin 81 mg. Weight loss. Discussed continuing with protein.  05/05/15-prior visit with Nada Boozer, NP, cardiac cachexia was hypothesized however most recent echocardiogram at [facility] in 03/2015 demonstrated an ejection fraction of 45%, reassuring with only mild perivalvular regurgitation of aortic TAVR x 2 valve.  Afib since 8/16. They're requesting pacemaker follow-up here.    Wt Readings from Last 3 Encounters:  05/05/15 145 lb 1.9 oz (65.826 kg)  03/20/15 148 lb (67.132 kg)  06/30/14 161 lb (73.029 kg)     Past Medical History    Diagnosis Date  . Diabetes mellitus   . Hypercholesteremia   . Aortic valve replaced   . Cardiomyopathy [facility])     Past Surgical History  Procedure Laterality Date  . Pacemaker insertion      Current Outpatient Prescriptions  Medication Sig Dispense Refill  . carvedilol (COREG) 25 MG tablet Take 25 mg by mouth 2 (two) times daily with a meal.    . cetirizine (ZYRTEC) 10 MG tablet Take 10 mg by mouth as needed for allergies.     . diphenhydrAMINE (BENADRYL) 25 mg capsule Take 25 mg by mouth at bedtime as needed. For sleep    . dorzolamide-timolol (COSOPT) 22.3-6.8 MG/ML ophthalmic solution Place 1 drop into both eyes 2 (two) times daily.    . ferrous sulfate 325 (65 FE) MG tablet Take 325 mg by mouth daily with breakfast.     . finasteride (PROSCAR) 5 MG tablet Take 5 mg by mouth daily.      . fluocinonide (LIDEX) 0.05 % ointment Apply 1 application topically daily.     . fluticasone (FLONASE) 50 MCG/ACT nasal spray Place 2 sprays into the nose as needed for allergies.     Marland Kitchen latanoprost (XALATAN) 0.005 % ophthalmic solution Place 1 drop into both eyes at bedtime.      . mirtazapine (REMERON) 15 MG tablet Take 15 mg by mouth at bedtime.    . pantoprazole (PROTONIX) 40 MG tablet Take 40 mg by mouth daily.      . polyethylene glycol (MIRALAX / GLYCOLAX) packet Take by mouth.    Marland Kitchen  pravastatin (PRAVACHOL) 20 MG tablet Take 20 mg by mouth daily.      . Tamsulosin HCl (FLOMAX) 0.4 MG CAPS Take 0.4 mg by mouth at bedtime.     . triamcinolone cream (KENALOG) 0.1 % Apply 1 application topically as needed (itching).    Marland Kitchen VITAMIN K PO Take 100 mcg by mouth daily.     Marland Kitchen warfarin (COUMADIN) 2 MG tablet Take 2 mg by mouth daily. Monday, Wednesday, Friday, Saturday    . warfarin (COUMADIN) 3 MG tablet Take 3 mg by mouth daily. Sunday, Tuesday, Thursday     No current facility-administered medications for this visit.    Allergies:   No Known Allergies  Social History:  The patient  reports that  he has never smoked. He does not have any smokeless tobacco history on file. He reports that he does not drink alcohol or use illicit drugs.   ROS:  Please see the history of present illness.   No syncope, no bleeding, no orthopnea, no PND, no chest pain    PHYSICAL EXAM: VS:  BP 134/58 mmHg  Pulse 90  Ht 6' (1.829 m)  Wt 145 lb 1.9 oz (65.826 kg)  BMI 19.68 kg/m2  SpO2 96% Well nourished, well developed, in no acute distress HEENT: normal Neck: no JVD Cardiac:  normal S1, S2; RRR; 1/6 diastolic murmur RUSB Lungs:  clear to auscultation bilaterally, no wheezing, rhonchi or rales Abd: soft, nontender, no hepatomegaly Ext: no edema Skin: warm and dry Neuro: no focal abnormalities noted  EKG:  06/30/14-AV pacing, 70                                                                                                                                               Echocardiogram  12/16 at [facility]-EF 45%, mild aortic regurgitation, mild LVH  ASSESSMENT AND PLAN:  1. Chronic systolic heart failure-ejection fraction has improved with biventricular pacing. EF currently approximately 45-50 %. This was at [facility].  We will continue with carvedilol. Unable to up titrate do to hypotension previously. Currently 25 twice a day. No longer utilizing furosemide. Chronic kidney disease noted. No ACE inhibitor because of chronic kidney disease and hypertension. 2.  cachexia-unexplained weight loss perhaps from cardiomyopathy although ejection fraction is quite reassuring. Continue to  encourage protein.  3. Chronic kidney disease stage III-[facility] has been following.  4. Biventricular ICD-this is interrogated at [facility]. they would like to go ahead and reestablish with Dr. Johney Frame and device clinic.  5. Aortic insufficiency-mild post TAVR, can hear on exam. 6.  atrial fibrillation persistent-since August 2016, histogram demonstrates persistent atrial fibrillation. He seems relatively unchanged from a symptom standpoint. He is not demonstrating any significant shortness of breath. No further atypical chest pain.  7. We will see back in 6 months.  Signed, Donato Schultz, MD [facility]  05/05/2015 1:46 PM

## 2015-05-10 ENCOUNTER — Encounter: Payer: Self-pay | Admitting: Internal Medicine

## 2015-07-11 DIAGNOSIS — E119 Type 2 diabetes mellitus without complications: Secondary | ICD-10-CM | POA: Diagnosis not present

## 2015-07-11 DIAGNOSIS — E784 Other hyperlipidemia: Secondary | ICD-10-CM | POA: Diagnosis not present

## 2015-07-13 ENCOUNTER — Ambulatory Visit (INDEPENDENT_AMBULATORY_CARE_PROVIDER_SITE_OTHER): Payer: Commercial Managed Care - HMO | Admitting: Internal Medicine

## 2015-07-13 ENCOUNTER — Encounter: Payer: Self-pay | Admitting: Internal Medicine

## 2015-07-13 VITALS — BP 154/60 | HR 86 | Ht 72.0 in | Wt 154.6 lb

## 2015-07-13 DIAGNOSIS — I10 Essential (primary) hypertension: Secondary | ICD-10-CM | POA: Diagnosis not present

## 2015-07-13 DIAGNOSIS — I482 Chronic atrial fibrillation: Secondary | ICD-10-CM | POA: Diagnosis not present

## 2015-07-13 DIAGNOSIS — Z950 Presence of cardiac pacemaker: Secondary | ICD-10-CM | POA: Diagnosis not present

## 2015-07-13 DIAGNOSIS — I4821 Permanent atrial fibrillation: Secondary | ICD-10-CM

## 2015-07-13 DIAGNOSIS — I442 Atrioventricular block, complete: Secondary | ICD-10-CM | POA: Diagnosis not present

## 2015-07-13 DIAGNOSIS — I5022 Chronic systolic (congestive) heart failure: Secondary | ICD-10-CM

## 2015-07-13 DIAGNOSIS — I4891 Unspecified atrial fibrillation: Secondary | ICD-10-CM

## 2015-07-13 LAB — CUP PACEART INCLINIC DEVICE CHECK
Brady Statistic AP VP Percent: 7.46 %
Brady Statistic AP VS Percent: 0 %
Brady Statistic AS VP Percent: 90.8 %
Brady Statistic RV Percent Paced: 98.26 %
Date Time Interrogation Session: 20170316131734
Lead Channel Impedance Value: 266 Ohm
Lead Channel Impedance Value: 342 Ohm
Lead Channel Impedance Value: 399 Ohm
Lead Channel Impedance Value: 4047 Ohm
Lead Channel Impedance Value: 4047 Ohm
Lead Channel Impedance Value: 437 Ohm
Lead Channel Impedance Value: 475 Ohm
Lead Channel Impedance Value: 608 Ohm
Lead Channel Pacing Threshold Amplitude: 0.875 V
Lead Channel Pacing Threshold Pulse Width: 0.4 ms
Lead Channel Sensing Intrinsic Amplitude: 1.1 mV
Lead Channel Setting Pacing Amplitude: 2 V
Lead Channel Setting Pacing Pulse Width: 0.4 ms
Lead Channel Setting Sensing Sensitivity: 0.9 mV
MDC IDC MSMT BATTERY REMAINING LONGEVITY: 32 mo
MDC IDC MSMT BATTERY VOLTAGE: 2.96 V
MDC IDC MSMT LEADCHNL LV IMPEDANCE VALUE: 4047 Ohm
MDC IDC MSMT LEADCHNL RV PACING THRESHOLD AMPLITUDE: 0.75 V
MDC IDC MSMT LEADCHNL RV PACING THRESHOLD PULSEWIDTH: 0.4 ms
MDC IDC MSMT LEADCHNL RV SENSING INTR AMPL: 7.4 mV
MDC IDC SET LEADCHNL LV PACING AMPLITUDE: 1.5 V
MDC IDC SET LEADCHNL RV PACING PULSEWIDTH: 0.4 ms
MDC IDC STAT BRADY AS VS PERCENT: 1.74 %
MDC IDC STAT BRADY RA PERCENT PACED: 7.46 %

## 2015-07-13 NOTE — Progress Notes (Signed)
Joseph Penna, MD: Primary Cardiologist:  Dr Arnold Long is a 82 y.o. male with a h/o complete heart block and permanent atrial fibrillation sp PPM (MDT) by at [facility] post TAVR who presents today to establish care in the Electrophysiology device clinic.  The patient reports doing very well since having a pacemaker implanted and remains very active despite his age.  He has afib for which he is asymptomatic.  He is anticoagulated with coumadin and INRs are followed by the [place].   Today, he  denies symptoms of palpitations, chest pain, shortness of breath, orthopnea, PND, lower extremity edema, dizziness, presyncope, syncope, or neurologic sequela.  The patientis tolerating medications without difficulties and is otherwise without complaint today.   Past Medical History  Diagnosis Date  . Diabetes mellitus   . Hypercholesteremia   . Aortic valve replaced     s/p TAVR  . Cardiomyopathy (HCC)   . Complete heart block (HCC)   . Hypertension   . Permanent atrial fibrillation (HCC)   . Hyperlipemia   . Iron deficiency anemia   . BPH (benign prostatic hyperplasia)    Past Surgical History  Procedure Laterality Date  . Pacemaker insertion  05/17/09    MDT Consult CRT-P implanted at [facility] by Dr Chryl Heck  . Transcatheter aortic valve replacement, transaortic  2012    [facility]    Social History   Social History  . Marital Status: Married    Spouse Name: N/A  . Number of Children: N/A  . Years of Education: N/A   Occupational History  . Not on file.   Social History Main Topics  . Smoking status: Never Smoker   . Smokeless tobacco: Not on file  . Alcohol Use: No  . Drug Use: No  . Sexual Activity: Not on file   Other Topics Concern  . Not on file   Social History Narrative   Retired from [employer]    Family History  Problem Relation Age of Onset  . Hypertension      No Known Allergies  Current Outpatient Prescriptions  Medication Sig Dispense Refill  .  carvedilol (COREG) 25 MG tablet Take 25 mg by mouth 2 (two) times daily with a meal.    . cetirizine (ZYRTEC) 10 MG tablet Take 10 mg by mouth as needed for allergies.     . diphenhydrAMINE (BENADRYL) 25 mg capsule Take 25 mg by mouth at bedtime as needed. For sleep    . dorzolamide (TRUSOPT) 2 % ophthalmic solution Apply 1 drop to eye 2 (two) times daily.    . dorzolamide-timolol (COSOPT) 22.3-6.8 MG/ML ophthalmic solution Place 1 drop into both eyes 2 (two) times daily.    . ferrous sulfate 325 (65 FE) MG tablet Take 325 mg by mouth daily with breakfast.     . finasteride (PROSCAR) 5 MG tablet Take 5 mg by mouth daily.      . fluocinonide (LIDEX) 0.05 % ointment Apply 1 application topically daily.     . fluticasone (FLONASE) 50 MCG/ACT nasal spray Place 2 sprays into the nose as needed for allergies.     Marland Kitchen latanoprost (XALATAN) 0.005 % ophthalmic solution Place 1 drop into both eyes at bedtime.      . mirtazapine (REMERON) 15 MG tablet Take 15 mg by mouth at bedtime.    . pantoprazole (PROTONIX) 40 MG tablet Take 40 mg by mouth daily.      . polyethylene glycol (MIRALAX / GLYCOLAX) packet Take  by mouth.    . pravastatin (PRAVACHOL) 20 MG tablet Take 20 mg by mouth daily.      . Tamsulosin HCl (FLOMAX) 0.4 MG CAPS Take 0.4 mg by mouth at bedtime.     . triamcinolone cream (KENALOG) 0.1 % Apply 1 application topically as needed (itching).    Marland Kitchen VITAMIN K PO Take 100 mcg by mouth daily.     Marland Kitchen warfarin (COUMADIN) 2 MG tablet Take 2 mg by mouth daily. Monday, Wednesday, Friday, Saturday    . warfarin (COUMADIN) 3 MG tablet Take 3 mg by mouth daily. Sunday, Tuesday, Thursday     No current facility-administered medications for this visit.    ROS- all systems are reviewed and negative except as per HPI  Physical Exam: Filed Vitals:   07/13/15 0921  BP: 154/60  Pulse: 86  Height: 6' (1.829 m)  Weight: 154 lb 9.6 oz (70.126 kg)    GEN- The patient is elderly appearing, alert and oriented x  3 today.   Head- normocephalic, atraumatic Eyes-  Sclera clear, conjunctiva pink Ears- hearing intact Oropharynx- clear Neck- supple,   Lungs- Clear to ausculation bilaterally, normal work of breathing Chest- pacemaker pocket is well healed Heart- Regular rate and rhythm (paced) GI- soft, NT, ND, + BS Extremities- no clubbing, cyanosis, trace LLE edema MS- age appropriate atrophy Skin- no rash or lesion Psych- euthymic mood, full affect Neuro- strength and sensation are intact  Pacemaker interrogation- reviewed in detail today,  See PACEART report  Assessment and Plan:  1. Complete heart block Normal biventricular pacemaker function See Pace Art report Reprogrammed VVIR today to promote battery longevity carelink  2. Permanent afib chads2vasc score is at least 4 He is appropriately anticoagulated with coumadin [facility] [place]  3. HTN Stable No change required today;  4. Valvular heart disease Followed by Dr Anne Fu  Return to see EP NP in 1 year carelink Follow-up with Dr Anne Fu as scheduled  Hillis Range MD, [facility] 07/13/2015 10:01 AM

## 2015-07-13 NOTE — Patient Instructions (Signed)
Medication Instructions:   Your physician recommends that you continue on your current medications as directed. Please refer to the Current Medication list given to you today.  Labwork: None ordered   Testing/Procedures: None ordered   Follow-Up: Your physician wants you to follow-up in: 12 months with Amber Seiler, NP You will receive a reminder letter in the mail two months in advance. If you don't receive a letter, please call our office to schedule the follow-up appointment.  Remote monitoring is used to monitor your Pacemaker  from home. This monitoring reduces the number of office visits required to check your device to one time per year. It allows us to keep an eye on the functioning of your device to ensure it is working properly. You are scheduled for a device check from home on 10/12/15. You may send your transmission at any time that day. If you have a wireless device, the transmission will be sent automatically. After your physician reviews your transmission, you will receive a postcard with your next transmission date.     Any Other Special Instructions Will Be Listed Below (If Applicable).     If you need a refill on your cardiac medications before your next appointment, please call your pharmacy.   

## 2015-07-18 DIAGNOSIS — Z0000 Encounter for general adult medical examination without abnormal findings: Secondary | ICD-10-CM | POA: Diagnosis not present

## 2015-07-18 DIAGNOSIS — Z6821 Body mass index (BMI) 21.0-21.9, adult: Secondary | ICD-10-CM | POA: Diagnosis not present

## 2015-07-18 DIAGNOSIS — I10 Essential (primary) hypertension: Secondary | ICD-10-CM | POA: Diagnosis not present

## 2015-07-18 DIAGNOSIS — I25728 Atherosclerosis of autologous artery coronary artery bypass graft(s) with other forms of angina pectoris: Secondary | ICD-10-CM | POA: Diagnosis not present

## 2015-07-18 DIAGNOSIS — I4891 Unspecified atrial fibrillation: Secondary | ICD-10-CM | POA: Diagnosis not present

## 2015-07-18 DIAGNOSIS — Z950 Presence of cardiac pacemaker: Secondary | ICD-10-CM | POA: Diagnosis not present

## 2015-07-18 DIAGNOSIS — R740 Nonspecific elevation of levels of transaminase and lactic acid dehydrogenase [LDH]: Secondary | ICD-10-CM | POA: Diagnosis not present

## 2015-07-18 DIAGNOSIS — D509 Iron deficiency anemia, unspecified: Secondary | ICD-10-CM | POA: Diagnosis not present

## 2015-07-18 DIAGNOSIS — E119 Type 2 diabetes mellitus without complications: Secondary | ICD-10-CM | POA: Diagnosis not present

## 2015-09-26 ENCOUNTER — Encounter: Payer: Self-pay | Admitting: Internal Medicine

## 2015-10-03 ENCOUNTER — Encounter: Payer: Self-pay | Admitting: Internal Medicine

## 2015-10-17 ENCOUNTER — Encounter: Payer: Commercial Managed Care - HMO | Admitting: *Deleted

## 2015-10-17 ENCOUNTER — Telehealth: Payer: Self-pay | Admitting: Cardiology

## 2015-10-17 NOTE — Telephone Encounter (Signed)
Attempted to confirm remote transmission with pt. No answer and was unable to leave a message.   

## 2015-10-18 ENCOUNTER — Encounter: Payer: Self-pay | Admitting: Cardiology

## 2015-10-18 ENCOUNTER — Telehealth: Payer: Self-pay | Admitting: *Deleted

## 2015-10-18 ENCOUNTER — Telehealth: Payer: Self-pay | Admitting: Medical Oncology

## 2015-10-18 NOTE — Telephone Encounter (Signed)
I called Joseph Watts to send [facility] to [place] via fax.

## 2015-10-18 NOTE — Telephone Encounter (Signed)
Received vm call from pt's daughter, [name] stating pt had seen Dr Arbutus Ped for a few years for low hemoglobin & was released in 2015 but informed to call back if hemoglobin dropped to < 9.  She reports that his hgb is 8.8 & he is on some meds that his MD is concerned about & would like pt reevaluated.  Message to Dr Mohamed/Pod RN

## 2015-10-19 ENCOUNTER — Telehealth: Payer: Self-pay | Admitting: Medical Oncology

## 2015-10-19 NOTE — Telephone Encounter (Signed)
Pharmacist at [facility] reported pt hgb 8.8 on Tuesday.  Dr Jules Husbands ( covering for his PCP Dr Carleene Cooper ) ordered hemoccult cards, iron studies .

## 2015-10-20 ENCOUNTER — Telehealth: Payer: Self-pay | Admitting: Medical Oncology

## 2015-10-20 ENCOUNTER — Encounter: Payer: Self-pay | Admitting: Cardiology

## 2015-10-20 NOTE — Telephone Encounter (Signed)
Labs faxed to Dr Link Snuffer.

## 2015-10-21 ENCOUNTER — Encounter: Payer: Self-pay | Admitting: Internal Medicine

## 2015-10-24 ENCOUNTER — Telehealth: Payer: Self-pay | Admitting: Internal Medicine

## 2015-10-24 DIAGNOSIS — D649 Anemia, unspecified: Secondary | ICD-10-CM | POA: Diagnosis not present

## 2015-10-24 NOTE — Telephone Encounter (Signed)
Pt doesn't have a wirex adapter. Ordered pt a new wirex adapter. Pt daughter ok w/ this plan and will send transmission once she receives it.

## 2015-10-24 NOTE — Telephone Encounter (Signed)
NEw Message  Pt dtr calling for assistance in sending remote transmission- will be leaving pt house at 4pm- wanted call back before. Please call back and discuss.

## 2015-11-02 ENCOUNTER — Telehealth: Payer: Self-pay

## 2015-11-02 ENCOUNTER — Ambulatory Visit (INDEPENDENT_AMBULATORY_CARE_PROVIDER_SITE_OTHER): Payer: Commercial Managed Care - HMO | Admitting: *Deleted

## 2015-11-02 ENCOUNTER — Encounter: Payer: Self-pay | Admitting: Internal Medicine

## 2015-11-02 DIAGNOSIS — I442 Atrioventricular block, complete: Secondary | ICD-10-CM | POA: Diagnosis not present

## 2015-11-02 NOTE — Telephone Encounter (Signed)
Called pt back regarding remote transmission. Was unable to reach pt d/t phone not in service. Message sent via mychart that we did receive pts remote transmission.

## 2015-11-06 ENCOUNTER — Encounter: Payer: Self-pay | Admitting: Internal Medicine

## 2015-11-06 NOTE — Progress Notes (Signed)
Remote pacemaker transmission.   

## 2015-11-08 ENCOUNTER — Encounter: Payer: Self-pay | Admitting: Cardiology

## 2015-11-13 ENCOUNTER — Other Ambulatory Visit: Payer: Self-pay | Admitting: *Deleted

## 2015-11-13 DIAGNOSIS — D508 Other iron deficiency anemias: Secondary | ICD-10-CM

## 2015-11-14 ENCOUNTER — Telehealth: Payer: Self-pay | Admitting: Internal Medicine

## 2015-11-14 ENCOUNTER — Encounter: Payer: Self-pay | Admitting: Internal Medicine

## 2015-11-14 NOTE — Telephone Encounter (Signed)
Spoke with patient re lab/fu 7/20. Per patient I called his dtr Dawn at [phone] and left message re 7/20 appointments also.

## 2015-11-16 ENCOUNTER — Other Ambulatory Visit: Payer: Commercial Managed Care - HMO

## 2015-11-16 ENCOUNTER — Ambulatory Visit: Payer: Commercial Managed Care - HMO | Admitting: Internal Medicine

## 2015-12-04 LAB — CUP PACEART REMOTE DEVICE CHECK
Battery Voltage: 2.97 V
Brady Statistic AP VP Percent: 8.33 %
Brady Statistic AP VS Percent: 0 %
Brady Statistic AS VP Percent: 91.14 %
Brady Statistic RV Percent Paced: 99.47 %
Date Time Interrogation Session: 20170706191013
Implantable Lead Implant Date: 20120113
Implantable Lead Implant Date: 20120113
Implantable Lead Location: 753858
Implantable Lead Location: 753860
Implantable Lead Model: 5076
Lead Channel Impedance Value: 266 Ohm
Lead Channel Impedance Value: 399 Ohm
Lead Channel Impedance Value: 4047 Ohm
Lead Channel Impedance Value: 4047 Ohm
Lead Channel Impedance Value: 494 Ohm
Lead Channel Impedance Value: 494 Ohm
Lead Channel Impedance Value: 665 Ohm
Lead Channel Pacing Threshold Amplitude: 0.75 V
Lead Channel Pacing Threshold Amplitude: 0.875 V
Lead Channel Pacing Threshold Pulse Width: 0.4 ms
Lead Channel Pacing Threshold Pulse Width: 0.4 ms
Lead Channel Sensing Intrinsic Amplitude: 0.5 mV
Lead Channel Sensing Intrinsic Amplitude: 1.125 mV
Lead Channel Setting Sensing Sensitivity: 0.9 mV
MDC IDC LEAD IMPLANT DT: 20120113
MDC IDC LEAD LOCATION: 753859
MDC IDC MSMT BATTERY REMAINING LONGEVITY: 47 mo
MDC IDC MSMT LEADCHNL LV IMPEDANCE VALUE: 4047 Ohm
MDC IDC MSMT LEADCHNL LV PACING THRESHOLD AMPLITUDE: 1.125 V
MDC IDC MSMT LEADCHNL RA PACING THRESHOLD PULSEWIDTH: 0.4 ms
MDC IDC MSMT LEADCHNL RV IMPEDANCE VALUE: 361 Ohm
MDC IDC MSMT LEADCHNL RV SENSING INTR AMPL: 2.75 mV
MDC IDC MSMT LEADCHNL RV SENSING INTR AMPL: 2.75 mV
MDC IDC SET LEADCHNL LV PACING AMPLITUDE: 1.75 V
MDC IDC SET LEADCHNL LV PACING PULSEWIDTH: 0.4 ms
MDC IDC SET LEADCHNL RV PACING AMPLITUDE: 2 V
MDC IDC SET LEADCHNL RV PACING PULSEWIDTH: 0.4 ms
MDC IDC STAT BRADY AS VS PERCENT: 0.53 %
MDC IDC STAT BRADY RA PERCENT PACED: 8.33 %

## 2015-12-11 ENCOUNTER — Emergency Department (HOSPITAL_COMMUNITY): Payer: Commercial Managed Care - HMO

## 2015-12-11 ENCOUNTER — Emergency Department (HOSPITAL_COMMUNITY)
Admission: EM | Admit: 2015-12-11 | Discharge: 2015-12-11 | Disposition: A | Discharge status: Home / Self Care | Payer: Commercial Managed Care - HMO | Attending: Emergency Medicine | Admitting: Emergency Medicine

## 2015-12-11 ENCOUNTER — Encounter (HOSPITAL_COMMUNITY): Payer: Self-pay | Admitting: *Deleted

## 2015-12-11 DIAGNOSIS — S6992XA Unspecified injury of left wrist, hand and finger(s), initial encounter: Secondary | ICD-10-CM | POA: Diagnosis present

## 2015-12-11 DIAGNOSIS — S0990XA Unspecified injury of head, initial encounter: Secondary | ICD-10-CM | POA: Diagnosis not present

## 2015-12-11 DIAGNOSIS — I129 Hypertensive chronic kidney disease with stage 1 through stage 4 chronic kidney disease, or unspecified chronic kidney disease: Secondary | ICD-10-CM | POA: Diagnosis not present

## 2015-12-11 DIAGNOSIS — W0110XA Fall on same level from slipping, tripping and stumbling with subsequent striking against unspecified object, initial encounter: Secondary | ICD-10-CM | POA: Insufficient documentation

## 2015-12-11 DIAGNOSIS — Y999 Unspecified external cause status: Secondary | ICD-10-CM | POA: Insufficient documentation

## 2015-12-11 DIAGNOSIS — S199XXA Unspecified injury of neck, initial encounter: Secondary | ICD-10-CM | POA: Diagnosis not present

## 2015-12-11 DIAGNOSIS — E1122 Type 2 diabetes mellitus with diabetic chronic kidney disease: Secondary | ICD-10-CM | POA: Insufficient documentation

## 2015-12-11 DIAGNOSIS — Y939 Activity, unspecified: Secondary | ICD-10-CM | POA: Diagnosis not present

## 2015-12-11 DIAGNOSIS — Z7901 Long term (current) use of anticoagulants: Secondary | ICD-10-CM | POA: Diagnosis not present

## 2015-12-11 DIAGNOSIS — S60417A Abrasion of left little finger, initial encounter: Secondary | ICD-10-CM | POA: Diagnosis not present

## 2015-12-11 DIAGNOSIS — Y929 Unspecified place or not applicable: Secondary | ICD-10-CM | POA: Insufficient documentation

## 2015-12-11 DIAGNOSIS — Z950 Presence of cardiac pacemaker: Secondary | ICD-10-CM | POA: Insufficient documentation

## 2015-12-11 DIAGNOSIS — W19XXXA Unspecified fall, initial encounter: Secondary | ICD-10-CM

## 2015-12-11 DIAGNOSIS — N189 Chronic kidney disease, unspecified: Secondary | ICD-10-CM | POA: Diagnosis not present

## 2015-12-11 LAB — CBC
HCT: 27.9 % — ABNORMAL LOW (ref 39.0–52.0)
HEMOGLOBIN: 8.9 g/dL — AB (ref 13.0–17.0)
MCH: 32 pg (ref 26.0–34.0)
MCHC: 31.9 g/dL (ref 30.0–36.0)
MCV: 100.4 fL — AB (ref 78.0–100.0)
Platelets: 103 10*3/uL — ABNORMAL LOW (ref 150–400)
RBC: 2.78 MIL/uL — ABNORMAL LOW (ref 4.22–5.81)
RDW: 12.5 % (ref 11.5–15.5)
WBC: 3.6 10*3/uL — ABNORMAL LOW (ref 4.0–10.5)

## 2015-12-11 LAB — COMPREHENSIVE METABOLIC PANEL
ALK PHOS: 92 U/L (ref 38–126)
ALT: 22 U/L (ref 17–63)
ANION GAP: 7 (ref 5–15)
AST: 33 U/L (ref 15–41)
Albumin: 3.4 g/dL — ABNORMAL LOW (ref 3.5–5.0)
BUN: 23 mg/dL — ABNORMAL HIGH (ref 6–20)
CALCIUM: 8.7 mg/dL — AB (ref 8.9–10.3)
CO2: 19 mmol/L — ABNORMAL LOW (ref 22–32)
Chloride: 108 mmol/L (ref 101–111)
Creatinine, Ser: 1.13 mg/dL (ref 0.61–1.24)
GFR, EST NON AFRICAN AMERICAN: 56 mL/min — AB (ref >60)
Glucose, Bld: 128 mg/dL — ABNORMAL HIGH (ref 65–99)
Potassium: 4.5 mmol/L (ref 3.5–5.1)
Sodium: 134 mmol/L — ABNORMAL LOW (ref 135–145)
Total Bilirubin: 0.6 mg/dL (ref 0.3–1.2)
Total Protein: 6.1 g/dL — ABNORMAL LOW (ref 6.5–8.1)

## 2015-12-11 LAB — PROTIME-INR
INR: 2
PROTHROMBIN TIME: 23 s — AB (ref 11.4–15.2)

## 2015-12-11 NOTE — ED Notes (Signed)
Patient transported to CT 

## 2015-12-11 NOTE — ED Triage Notes (Signed)
Pt states sometime after lunch he fell into a door. Pt states he was trying to turn around when he lost his balance causing him to fall into a door hitting the right side of his head. Pt denies LOC, denies headache, dizziness, lightheadedness. Pt is on Coumadin.

## 2015-12-11 NOTE — ED Provider Notes (Signed)
[facility]-EMERGENCY DEPT Provider Note   CSN: [id number] Arrival date & time: 12/11/15  1553     History   Chief Complaint Chief Complaint  Patient presents with  . Fall    HPI Joseph Watts is a 82 y.o. male.  HPI 82 y.o. male with a hx of Aortic Valve Replacement, DM, HTN, HLD, Iron Deficiency Anemia, Afib on Coumadin, presents to the Emergency Department today due to mechanical fall this afternoon around 1pm. States that he got up from his bed and decided to turn abruptly and tripped and fell, hitting the left side of his head. No LOC. Notes no N/V. No headache. No dizziness. No lightheadedness. No syncope. Normally uses a walker to ambulate,but did not at that time. Reports no pain currently. No visual changes. Pt is on Coumadin for Afib. No other symptoms noted.   Past Medical History:  Diagnosis Date  . Aortic valve replaced    s/p TAVR  . BPH (benign prostatic hyperplasia)   . Cardiomyopathy (HCC)   . Complete heart block (HCC)   . Diabetes mellitus   . Hypercholesteremia   . Hyperlipemia   . Hypertension   . Iron deficiency anemia   . Permanent atrial fibrillation [facility])     Patient Active Problem List   Diagnosis Date Noted  . Complete heart block (HCC) 07/13/2015  . Permanent atrial fibrillation (HCC) 07/13/2015  . Essential hypertension 07/13/2015  . Anemia of other chronic disease 12/01/2012  . Hypercholesteremia   . Aortic valve replaced   . Chronic systolic heart failure (HCC) 08/26/2010  . Aortic insufficiency 08/26/2010  . Diabetes mellitus 08/26/2010  . CKD (chronic kidney disease) 08/26/2010  . Glaucoma 08/26/2010  . Status post biventricular cardiac pacemaker procedure 08/26/2010  . Hx of CABG 08/26/2010    Past Surgical History:  Procedure Laterality Date  . PACEMAKER INSERTION  05/12/10   MDT Consult CRT-P implanted at [facility] by Dr Chryl Heck  . TRANSCATHETER AORTIC VALVE REPLACEMENT, TRANSAORTIC  2012   [facility]       Home Medications    Prior  to Admission medications   Medication Sig Start Date End Date Taking? Authorizing Provider  carvedilol (COREG) 25 MG tablet Take 25 mg by mouth 2 (two) times daily with a meal.    Historical Provider, MD  cetirizine (ZYRTEC) 10 MG tablet Take 10 mg by mouth as needed for allergies.     Historical Provider, MD  diphenhydrAMINE (BENADRYL) 25 mg capsule Take 25 mg by mouth at bedtime as needed. For sleep    Historical Provider, MD  dorzolamide (TRUSOPT) 2 % ophthalmic solution Apply 1 drop to eye 2 (two) times daily. 05/23/15 05/22/16  Historical Provider, MD  dorzolamide-timolol (COSOPT) 22.3-6.8 MG/ML ophthalmic solution Place 1 drop into both eyes 2 (two) times daily.    Historical Provider, MD  ferrous sulfate 325 (65 FE) MG tablet Take 325 mg by mouth daily with breakfast.  09/03/11   Si Gaul, MD  finasteride (PROSCAR) 5 MG tablet Take 5 mg by mouth daily.      Historical Provider, MD  fluocinonide (LIDEX) 0.05 % ointment Apply 1 application topically daily.     Historical Provider, MD  fluticasone (FLONASE) 50 MCG/ACT nasal spray Place 2 sprays into the nose as needed for allergies.     Historical Provider, MD  latanoprost (XALATAN) 0.005 % ophthalmic solution Place 1 drop into both eyes at bedtime.      Historical Provider, MD  mirtazapine (REMERON) 15 MG tablet Take 15  mg by mouth at bedtime.    Historical Provider, MD  pantoprazole (PROTONIX) 40 MG tablet Take 40 mg by mouth daily.      Historical Provider, MD  polyethylene glycol (MIRALAX / GLYCOLAX) packet Take by mouth.    Historical Provider, MD  pravastatin (PRAVACHOL) 20 MG tablet Take 20 mg by mouth daily.      Historical Provider, MD  Tamsulosin HCl (FLOMAX) 0.4 MG CAPS Take 0.4 mg by mouth at bedtime.     Historical Provider, MD  triamcinolone cream (KENALOG) 0.1 % Apply 1 application topically as needed (itching).    Historical Provider, MD  VITAMIN K PO Take 100 mcg by mouth daily.     Historical Provider, MD  warfarin  (COUMADIN) 2 MG tablet Take 2 mg by mouth daily. Monday, Wednesday, Friday, Saturday    Historical Provider, MD  warfarin (COUMADIN) 3 MG tablet Take 3 mg by mouth daily. Sunday, Tuesday, Thursday    Historical Provider, MD    Family History Family History  Problem Relation Age of Onset  . Hypertension      Social History Social History  Substance Use Topics  . Smoking status: Never Smoker  . Smokeless tobacco: Not on file  . Alcohol use No     Allergies   Review of patient's allergies indicates no known allergies.   Review of Systems Review of Systems ROS reviewed and all are negative for acute change except as noted in the HPI.  Physical Exam Updated Vital Signs BP (!) 119/46 (BP Location: Right Arm)   Pulse 73   Temp 99 F (37.2 C) (Oral)   SpO2 99%   Physical Exam  Constitutional: He is oriented to person, place, and time. Vital signs are normal. He appears well-developed and well-nourished.  HENT:  Head: Normocephalic and atraumatic. Head is without raccoon's eyes, without Battle's sign, without abrasion, without contusion and without laceration.  Right Ear: Hearing normal.  Left Ear: Hearing normal.  Eyes: Conjunctivae and EOM are normal. Pupils are equal, round, and reactive to light.  Neck: Normal range of motion. Neck supple.  Cardiovascular: Normal rate and intact distal pulses.   Murmur heard. Pulmonary/Chest: Effort normal and breath sounds normal. No respiratory distress. He has no wheezes. He has no rales.  Abdominal: Soft.  Musculoskeletal:  Small 0.5cm superficial abrasion on left 5th MCP. Bleeding controlled.   Neurological: He is alert and oriented to person, place, and time. He has normal strength. No cranial nerve deficit or sensory deficit.  Cranial Nerves:  II: Pupils equal, round, reactive to light III,IV, VI: ptosis not present, extra-ocular motions intact bilaterally  V,VII: smile symmetric, facial light touch sensation equal VIII:  hearing grossly normal bilaterally  IX,X: midline uvula rise  XI: bilateral shoulder shrug equal and strong XII: midline tongue extension  Skin: Skin is warm and dry.  Psychiatric: He has a normal mood and affect. His speech is normal and behavior is normal. Thought content normal.   ED Treatments / Results  Labs (all labs ordered are listed, but only abnormal results are displayed) Labs Reviewed  PROTIME-INR - Abnormal; Notable for the following:       Result Value   Prothrombin Time 23.0 (*)    All other components within normal limits  CBC - Abnormal; Notable for the following:    WBC 3.6 (*)    RBC 2.78 (*)    Hemoglobin 8.9 (*)    HCT 27.9 (*)    MCV 100.4 (*)  Platelets 103 (*)    All other components within normal limits  COMPREHENSIVE METABOLIC PANEL - Abnormal; Notable for the following:    Sodium 134 (*)    CO2 19 (*)    Glucose, Bld 128 (*)    BUN 23 (*)    Calcium 8.7 (*)    Total Protein 6.1 (*)    Albumin 3.4 (*)    GFR calc non Af Amer 56 (*)    All other components within normal limits    EKG  EKG Interpretation None       Radiology Ct Head Wo Contrast  Result Date: 12/11/2015 CLINICAL DATA:  Larey Seat today. No reported loss of consciousness. Patient reports just a bump. EXAM: CT HEAD WITHOUT CONTRAST TECHNIQUE: Contiguous axial images were obtained from the base of the skull through the vertex without intravenous contrast. COMPARISON:  10/03/2007. FINDINGS: Brain: No evidence of acute infarction, hemorrhage, extra-axial collection, ventriculomegaly, or mass effect. A moderate atrophy, not unexpected for age. Chronic microvascular ischemic change. Vascular: No hyperdense vessel. Dolichoectatic calcified skullbase carotid arteries. Skull: Negative for fracture or focal lesion. Sinuses/Orbits: No acute findings.  BILATERAL cataract extraction. Other: None. IMPRESSION: Atrophy and small vessel disease, not unexpected for age. No skull fracture or intracranial  hemorrhage. Electronically Signed   By: Elsie Stain M.D.   On: 12/11/2015 16:34   Ct Cervical Spine Wo Contrast  Result Date: 12/11/2015 CLINICAL DATA:  Patient fell and hit back of head. Neck pain is not reported. EXAM: CT CERVICAL SPINE WITHOUT CONTRAST TECHNIQUE: Multidetector CT imaging of the cervical spine was performed without intravenous contrast. Multiplanar CT image reconstructions were also generated. COMPARISON:  None. FINDINGS: There is no visible cervical spine fracture, traumatic subluxation, prevertebral soft tissue swelling, or intraspinal hematoma. Moderate disc space narrowing most notable at C6-C7. Suspected central protrusions at C3-4 and C5-6 not clearly compressive. Advanced atherosclerosis especially the carotid bifurcations. Scarring at the lung apices. Airway midline. Incidental pacemaker leads. IMPRESSION: Cervical spondylosis. No cervical spine fracture or traumatic subluxation. Advanced atherosclerosis with calcification of both carotid bifurcations. Electronically Signed   By: Elsie Stain M.D.   On: 12/11/2015 20:43    Procedures Procedures (including critical care time)  Medications Ordered in ED Medications - No data to display   Initial Impression / Assessment and Plan / ED Course  I have reviewed the triage vital signs and the nursing notes.  Pertinent labs & imaging results that were available during my care of the patient were reviewed by me and considered in my medical decision making (see chart for details).  Clinical Course    Final Clinical Impressions(s) / ED Diagnoses  I have reviewed and evaluated the relevant laboratory valuesI have reviewed and evaluated the relevant imaging studies.  I have reviewed the relevant previous healthcare records. I obtained HPI from historian. Patient discussed with supervising physician  ED Course:  Assessment: Pt is a 89yM with hx Aortic Valve Replacement, DM, HTN, HLD, Iron Deficiency Anemia, Afib on Coumadin  who presents s/p mechanical fall around 1pm. No headache. No lightheadedness. No LOC. No syncope. No pain currently. No vision changes. On exam, pt in NAD. Nontoxic/nonseptic appearing. VSS. Afebrile. Lungs CTA. Heart RRR. Abdomen nontender soft. CN evaluated and unremarkable. Labs unremarakble. INR stable. CT Head/neck negative. Plan is to DC home with follow up to PCP. Seen and evaluated by supervising physician. At time of discharge, Patient is in no acute distress. Vital Signs are stable. Patient is able to ambulate. Patient able  to tolerate PO.    Disposition/Plan:  DC Home Additional Verbal discharge instructions given and discussed with patient.  Pt Instructed to f/u with PCP in the next week for evaluation and treatment of symptoms. Return precautions given Pt acknowledges and agrees with plan  Supervising Physician Lavera Guiseana Duo Liu, MD   Final diagnoses:  Fall, initial encounter  Head trauma, initial encounter    New Prescriptions New Prescriptions   No medications on file     Audry Piliyler Tristin Gladman, PA-C 12/11/15 2130    Lavera Guiseana Duo Liu, MD 12/12/15 1325

## 2015-12-11 NOTE — Discharge Instructions (Signed)
Please read and follow all provided instructions.  Your diagnoses today include:  1. Fall, initial encounter   2. Head trauma, initial encounter   3. Fall     Tests performed today include: Vital signs. See below for your results today.   Medications prescribed:  Take as prescribed   Home care instructions:  Follow any educational materials contained in this packet.  Follow-up instructions: Please follow-up with your primary care provider for further evaluation of symptoms and treatment   Return instructions:  Please return to the Emergency Department if you do not get better, if you get worse, or new symptoms OR  - Fever (temperature greater than 101.52F)  - Bleeding that does not stop with holding pressure to the area    -Severe pain (please note that you may be more sore the day after your accident)  - Chest Pain  - Difficulty breathing  - Severe nausea or vomiting  - Inability to tolerate food and liquids  - Passing out  - Skin becoming red around your wounds  - Change in mental status (confusion or lethargy)  - New numbness or weakness    Please return if you have any other emergent concerns.  Additional Information:  Your vital signs today were: BP 157/66    Pulse 72    Temp 99 F (37.2 C) (Oral)    Resp 15    SpO2 100%  If your blood pressure (BP) was elevated above 135/85 this visit, please have this repeated by your doctor within one month. ---------------

## 2015-12-11 NOTE — ED Provider Notes (Signed)
Medical screening examination/treatment/procedure(s) were conducted as a shared visit with non-physician practitioner(s) and myself.  I personally evaluated the patient during the encounter.   EKG Interpretation None      82 year old male who presents after mechanical fall. History of afib on coumadin, TAVR, and [facility] s/p PPM. Tripped and fell on left side of head. No LOC, HA, N/V, confusion. Able to get up and ambulate normally afterwards. Feels normal. Neuro in tact grossly. No evidence of acute traumatic injury on exam. Given age and coumadin usage, CT head and neck performed. If negative for acute traumatic injuries, will be discharged home.    Lavera Guise, MD 12/11/15 2033

## 2016-01-30 DIAGNOSIS — R634 Abnormal weight loss: Secondary | ICD-10-CM | POA: Diagnosis not present

## 2016-01-30 DIAGNOSIS — Z681 Body mass index (BMI) 19 or less, adult: Secondary | ICD-10-CM | POA: Diagnosis not present

## 2016-01-30 DIAGNOSIS — I10 Essential (primary) hypertension: Secondary | ICD-10-CM | POA: Diagnosis not present

## 2016-01-30 DIAGNOSIS — R197 Diarrhea, unspecified: Secondary | ICD-10-CM | POA: Diagnosis not present

## 2016-01-30 DIAGNOSIS — E784 Other hyperlipidemia: Secondary | ICD-10-CM | POA: Diagnosis not present

## 2016-01-30 DIAGNOSIS — E119 Type 2 diabetes mellitus without complications: Secondary | ICD-10-CM | POA: Diagnosis not present

## 2016-01-30 DIAGNOSIS — R740 Nonspecific elevation of levels of transaminase and lactic acid dehydrogenase [LDH]: Secondary | ICD-10-CM | POA: Diagnosis not present

## 2016-01-30 DIAGNOSIS — G4700 Insomnia, unspecified: Secondary | ICD-10-CM | POA: Diagnosis not present

## 2016-01-30 DIAGNOSIS — I25728 Atherosclerosis of autologous artery coronary artery bypass graft(s) with other forms of angina pectoris: Secondary | ICD-10-CM | POA: Diagnosis not present

## 2016-02-06 ENCOUNTER — Telehealth: Payer: Self-pay | Admitting: Cardiology

## 2016-02-06 ENCOUNTER — Encounter: Payer: Commercial Managed Care - HMO | Admitting: *Deleted

## 2016-02-06 NOTE — Telephone Encounter (Signed)
Attempted to confirm remote transmission with pt. No answer and was unable to leave a message.   

## 2016-02-09 ENCOUNTER — Encounter: Payer: Self-pay | Admitting: Cardiology

## 2016-02-09 NOTE — Progress Notes (Signed)
Letter  

## 2016-02-10 ENCOUNTER — Encounter: Payer: Self-pay | Admitting: Internal Medicine

## 2016-02-15 ENCOUNTER — Ambulatory Visit (INDEPENDENT_AMBULATORY_CARE_PROVIDER_SITE_OTHER): Payer: Commercial Managed Care - HMO | Admitting: *Deleted

## 2016-02-15 ENCOUNTER — Encounter: Payer: Self-pay | Admitting: Internal Medicine

## 2016-02-15 DIAGNOSIS — I482 Chronic atrial fibrillation: Secondary | ICD-10-CM

## 2016-02-15 DIAGNOSIS — I4821 Permanent atrial fibrillation: Secondary | ICD-10-CM

## 2016-02-16 ENCOUNTER — Encounter: Payer: Self-pay | Admitting: Cardiology

## 2016-02-16 NOTE — Progress Notes (Signed)
Remote pacemaker transmission.   

## 2016-03-12 LAB — CUP PACEART REMOTE DEVICE CHECK
Battery Remaining Longevity: 41 mo
Brady Statistic AP VP Percent: 0 %
Brady Statistic AP VS Percent: 0 %
Brady Statistic AS VS Percent: 0 %
Brady Statistic RV Percent Paced: 0 %
Date Time Interrogation Session: 20171019175309
Implantable Lead Implant Date: 20120113
Implantable Lead Implant Date: 20120113
Implantable Lead Location: 753859
Implantable Lead Location: 753860
Implantable Lead Model: 5076
Implantable Pulse Generator Implant Date: 20111019
Lead Channel Impedance Value: 361 Ohm
Lead Channel Impedance Value: 399 Ohm
Lead Channel Impedance Value: 4047 Ohm
Lead Channel Impedance Value: 456 Ohm
Lead Channel Impedance Value: 494 Ohm
Lead Channel Impedance Value: 627 Ohm
Lead Channel Pacing Threshold Amplitude: 0.75 V
Lead Channel Pacing Threshold Amplitude: 0.875 V
Lead Channel Pacing Threshold Amplitude: 1.125 V
Lead Channel Sensing Intrinsic Amplitude: 1.125 mV
Lead Channel Sensing Intrinsic Amplitude: 3.125 mV
Lead Channel Sensing Intrinsic Amplitude: 3.125 mV
Lead Channel Setting Pacing Amplitude: 1.75 V
Lead Channel Setting Pacing Amplitude: 2 V
Lead Channel Setting Pacing Pulse Width: 0.4 ms
MDC IDC LEAD IMPLANT DT: 20120113
MDC IDC LEAD LOCATION: 753858
MDC IDC MSMT BATTERY VOLTAGE: 2.96 V
MDC IDC MSMT LEADCHNL LV IMPEDANCE VALUE: 4047 Ohm
MDC IDC MSMT LEADCHNL LV IMPEDANCE VALUE: 4047 Ohm
MDC IDC MSMT LEADCHNL LV PACING THRESHOLD PULSEWIDTH: 0.4 ms
MDC IDC MSMT LEADCHNL RA IMPEDANCE VALUE: 266 Ohm
MDC IDC MSMT LEADCHNL RA PACING THRESHOLD PULSEWIDTH: 0.4 ms
MDC IDC MSMT LEADCHNL RA SENSING INTR AMPL: 0.5 mV
MDC IDC MSMT LEADCHNL RV PACING THRESHOLD PULSEWIDTH: 0.4 ms
MDC IDC SET LEADCHNL RV PACING PULSEWIDTH: 0.4 ms
MDC IDC SET LEADCHNL RV SENSING SENSITIVITY: 0.9 mV
MDC IDC STAT BRADY AS VP PERCENT: 0 %
MDC IDC STAT BRADY RA PERCENT PACED: 0 %

## 2016-04-22 ENCOUNTER — Encounter (HOSPITAL_COMMUNITY): Payer: Self-pay | Admitting: Vascular Surgery

## 2016-04-22 ENCOUNTER — Emergency Department (HOSPITAL_COMMUNITY)
Admission: EM | Admit: 2016-04-22 | Discharge: 2016-04-22 | Disposition: A | Discharge status: Home / Self Care | Payer: Commercial Managed Care - HMO | Attending: Emergency Medicine | Admitting: Emergency Medicine

## 2016-04-22 ENCOUNTER — Emergency Department (HOSPITAL_COMMUNITY): Payer: Commercial Managed Care - HMO

## 2016-04-22 DIAGNOSIS — E1122 Type 2 diabetes mellitus with diabetic chronic kidney disease: Secondary | ICD-10-CM | POA: Diagnosis not present

## 2016-04-22 DIAGNOSIS — Y999 Unspecified external cause status: Secondary | ICD-10-CM | POA: Insufficient documentation

## 2016-04-22 DIAGNOSIS — S0990XA Unspecified injury of head, initial encounter: Secondary | ICD-10-CM | POA: Diagnosis not present

## 2016-04-22 DIAGNOSIS — M25519 Pain in unspecified shoulder: Secondary | ICD-10-CM | POA: Diagnosis not present

## 2016-04-22 DIAGNOSIS — I5022 Chronic systolic (congestive) heart failure: Secondary | ICD-10-CM | POA: Insufficient documentation

## 2016-04-22 DIAGNOSIS — S42031A Displaced fracture of lateral end of right clavicle, initial encounter for closed fracture: Secondary | ICD-10-CM | POA: Insufficient documentation

## 2016-04-22 DIAGNOSIS — W1839XA Other fall on same level, initial encounter: Secondary | ICD-10-CM | POA: Insufficient documentation

## 2016-04-22 DIAGNOSIS — S4991XA Unspecified injury of right shoulder and upper arm, initial encounter: Secondary | ICD-10-CM | POA: Diagnosis present

## 2016-04-22 DIAGNOSIS — R259 Unspecified abnormal involuntary movements: Secondary | ICD-10-CM | POA: Diagnosis not present

## 2016-04-22 DIAGNOSIS — Z7901 Long term (current) use of anticoagulants: Secondary | ICD-10-CM | POA: Insufficient documentation

## 2016-04-22 DIAGNOSIS — Z79899 Other long term (current) drug therapy: Secondary | ICD-10-CM | POA: Diagnosis not present

## 2016-04-22 DIAGNOSIS — W19XXXA Unspecified fall, initial encounter: Secondary | ICD-10-CM

## 2016-04-22 DIAGNOSIS — R930 Abnormal findings on diagnostic imaging of skull and head, not elsewhere classified: Secondary | ICD-10-CM | POA: Insufficient documentation

## 2016-04-22 DIAGNOSIS — Y9389 Activity, other specified: Secondary | ICD-10-CM | POA: Diagnosis not present

## 2016-04-22 DIAGNOSIS — Y92003 Bedroom of unspecified non-institutional (private) residence as the place of occurrence of the external cause: Secondary | ICD-10-CM | POA: Insufficient documentation

## 2016-04-22 DIAGNOSIS — I130 Hypertensive heart and chronic kidney disease with heart failure and stage 1 through stage 4 chronic kidney disease, or unspecified chronic kidney disease: Secondary | ICD-10-CM | POA: Insufficient documentation

## 2016-04-22 DIAGNOSIS — N189 Chronic kidney disease, unspecified: Secondary | ICD-10-CM | POA: Diagnosis not present

## 2016-04-22 DIAGNOSIS — Z950 Presence of cardiac pacemaker: Secondary | ICD-10-CM | POA: Diagnosis not present

## 2016-04-22 DIAGNOSIS — Z951 Presence of aortocoronary bypass graft: Secondary | ICD-10-CM | POA: Insufficient documentation

## 2016-04-22 LAB — BASIC METABOLIC PANEL
Anion gap: 4 — ABNORMAL LOW (ref 5–15)
BUN: 23 mg/dL — ABNORMAL HIGH (ref 6–20)
CALCIUM: 8.5 mg/dL — AB (ref 8.9–10.3)
CO2: 23 mmol/L (ref 22–32)
CREATININE: 1.02 mg/dL (ref 0.61–1.24)
Chloride: 107 mmol/L (ref 101–111)
GFR calc non Af Amer: >60 mL/min (ref >60)
Glucose, Bld: 113 mg/dL — ABNORMAL HIGH (ref 65–99)
Potassium: 4.7 mmol/L (ref 3.5–5.1)
SODIUM: 134 mmol/L — AB (ref 135–145)

## 2016-04-22 LAB — CBC WITH DIFFERENTIAL/PLATELET
BASOS PCT: 0 %
Basophils Absolute: 0 10*3/uL (ref 0.0–0.1)
EOS ABS: 0.3 10*3/uL (ref 0.0–0.7)
Eosinophils Relative: 7 %
HCT: 29.6 % — ABNORMAL LOW (ref 39.0–52.0)
Hemoglobin: 9.7 g/dL — ABNORMAL LOW (ref 13.0–17.0)
Lymphocytes Relative: 34 %
Lymphs Abs: 1.3 10*3/uL (ref 0.7–4.0)
MCH: 32.9 pg (ref 26.0–34.0)
MCHC: 32.8 g/dL (ref 30.0–36.0)
MCV: 100.3 fL — ABNORMAL HIGH (ref 78.0–100.0)
MONO ABS: 0.6 10*3/uL (ref 0.1–1.0)
Monocytes Relative: 16 %
NEUTROS PCT: 43 %
Neutro Abs: 1.7 10*3/uL (ref 1.7–7.7)
PLATELETS: 100 10*3/uL — AB (ref 150–400)
RBC: 2.95 MIL/uL — ABNORMAL LOW (ref 4.22–5.81)
RDW: 13.4 % (ref 11.5–15.5)
WBC: 3.9 10*3/uL — ABNORMAL LOW (ref 4.0–10.5)

## 2016-04-22 LAB — PROTIME-INR
INR: 2.71
PROTHROMBIN TIME: 29.3 s — AB (ref 11.4–15.2)

## 2016-04-22 MED ORDER — HYDROCODONE-ACETAMINOPHEN 5-325 MG PO TABS
1.0000 | ORAL_TABLET | Freq: Once | ORAL | Status: AC
  Administered 2016-04-22: 1 via ORAL
  Filled 2016-04-22: qty 1

## 2016-04-22 MED ORDER — HYDROCODONE-ACETAMINOPHEN 5-325 MG PO TABS: 1.0000 | ORAL_TABLET | ORAL | 0 refills | Status: DC | PRN

## 2016-04-22 MED ORDER — FENTANYL CITRATE (PF) 100 MCG/2ML IJ SOLN
50.0000 ug | Freq: Once | INTRAMUSCULAR | Status: AC
  Administered 2016-04-22: 50 ug via INTRAVENOUS
  Filled 2016-04-22: qty 2

## 2016-04-22 NOTE — Discharge Instructions (Signed)
I recommend taking your pain medication as prescribed. Continue wearing your sling until you follow up with orthopedics.  Call the orthopedic clinic listed below to schedule a follow-up appointment for this week for further evaluation and management of your clavicle fracture. Please return to the Emergency Department if symptoms worsen or new onset of fever, redness, swelling, warmth, decreased range of motion, numbness, tingling, weakness.

## 2016-04-22 NOTE — Progress Notes (Signed)
Orthopedic Tech Progress Note Patient Details:  Joseph Watts 10-12-1926 [id number]  Ortho Devices Type of Ortho Device: Sling immobilizer Ortho Device/Splint Interventions: Application   [name] 04/22/2016, 9:33 AM

## 2016-04-22 NOTE — ED Triage Notes (Signed)
Pt reports to the ED via PTAR from home for eval of right shoulder pain following a fall that occurred this am. Per EMS fall occurred around 7 am. Pt got up to the use the BR and fell in his bedroom. Pt normally ambulates with a walker. Pt leaned over to feed the cat reached for his walker and lost his balance and fell on his right side. Pt was unable to get himself up. Pt complaining of severe right shoulder pain with deformity noted while he was on the ground. CMS intact. Full ROM limited. Pt denies any head injury or LOC. VSS en route.

## 2016-04-22 NOTE — ED Provider Notes (Signed)
[facility]-EMER[facility]ENCY DEPT Provider Note   CSN: [id number] Arrival date & time: 04/22/16  0751     History   Chief Complaint Chief Complaint  Patient presents with  . Fall    HPI Joseph Watts is a 82 y.o. male.  HPI   Patient is a 82 year old male with history of A. fib on Coumadin, diabetes, hypertension and aortic valve replacement who presents to the ED via EMS from home status post fall that occurred prior to arrival. Patient reports he got out of bed around 7 AM, tried to lay down to feed his cat and reports as he tried to grab onto his walker the brakes were not on resulting in him sliding and landing on his right side. Denies head injury or LOC. Patient reports having associated pain to his right shoulder. Patient reports his shoulder pain is aggravated with any movement of his right arm. Denies any other pain or complaints. Denies HA, lightheadedness, dizziness, neck/back pain, CP, SOB, cough, abdominal pain, N/V/D, urinary sxs, numbness, tingling, weakness. Patient reports using a walker at home at baseline. Reports living at home with his wife.  Past Medical History:  Diagnosis Date  . Aortic valve replaced    s/p TAVR  . BPH (benign prostatic hyperplasia)   . Cardiomyopathy (HCC)   . Complete heart block (HCC)   . Diabetes mellitus   . Hypercholesteremia   . Hyperlipemia   . Hypertension   . Iron deficiency anemia   . Permanent atrial fibrillation [facility])     Patient Active Problem List   Diagnosis Date Noted  . Complete heart block (HCC) 07/13/2015  . Permanent atrial fibrillation (HCC) 07/13/2015  . Essential hypertension 07/13/2015  . Anemia of other chronic disease 12/01/2012  . Hypercholesteremia   . Aortic valve replaced   . Chronic systolic heart failure (HCC) 08/26/2010  . Aortic insufficiency 08/26/2010  . Diabetes mellitus 08/26/2010  . CKD (chronic kidney disease) 08/26/2010  . Glaucoma 08/26/2010  . Status post biventricular cardiac pacemaker  procedure 08/26/2010  . Hx of CABG 08/26/2010    Past Surgical History:  Procedure Laterality Date  . PACEMAKER INSERTION  05/12/10   MDT Consult CRT-P implanted at [facility] by Dr Chryl Heck  . TRANSCATHETER AORTIC VALVE REPLACEMENT, TRANSAORTIC  2012   [facility]       Home Medications    Prior to Admission medications   Medication Sig Start Date End Date Taking? Authorizing Provider  carvedilol (COREG) 25 MG tablet Take 25 mg by mouth 2 (two) times daily with a meal.   Yes Historical Provider, MD  cetirizine (ZYRTEC) 10 MG tablet Take 10 mg by mouth daily.    Yes Historical Provider, MD  dorzolamide-timolol (COSOPT) 22.3-6.8 MG/ML ophthalmic solution Place 1 drop into both eyes 2 (two) times daily.   Yes Historical Provider, MD  ferrous sulfate 325 (65 FE) MG tablet Take 162.5 mg by mouth daily with breakfast.  09/03/11  Yes Si Gaul, MD  finasteride (PROSCAR) 5 MG tablet Take 5 mg by mouth daily.     Yes Historical Provider, MD  fluocinonide (LIDEX) 0.05 % ointment Apply 1 application topically daily.    Yes Historical Provider, MD  fluticasone (FLONASE) 50 MCG/ACT nasal spray Place 2 sprays into the nose as needed for allergies.    Yes Historical Provider, MD  latanoprost (XALATAN) 0.005 % ophthalmic solution Place 1 drop into both eyes at bedtime.     Yes Historical Provider, MD  mirtazapine (REMERON) 15 MG tablet Take 15  mg by mouth at bedtime.   Yes Historical Provider, MD  Multiple Vitamins-Minerals (PRESERVISION AREDS 2 PO) Take 1 capsule by mouth every morning.   Yes Historical Provider, MD  Tamsulosin HCl (FLOMAX) 0.4 MG CAPS Take 0.4 mg by mouth at bedtime.    Yes Historical Provider, MD  triamcinolone cream (KENALOG) 0.1 % Apply 1 application topically as needed (itching).   Yes Historical Provider, MD  VITAMIN K PO Take 100 mcg by mouth daily.    Yes Historical Provider, MD  warfarin (COUMADIN) 4 MG tablet Take 4 mg by mouth See admin instructions. Take 4 mg by mouth daily except  on Friday.   Yes Historical Provider, MD  warfarin (COUMADIN) 5 MG tablet Take 5 mg by mouth every Friday.    Yes Historical Provider, MD    Family History Family History  Problem Relation Age of Onset  . Hypertension      Social History Social History  Substance Use Topics  . Smoking status: Never Smoker  . Smokeless tobacco: Never Used  . Alcohol use No     Allergies   Patient has no known allergies.   Review of Systems Review of Systems  Musculoskeletal: Positive for arthralgias (right shoulder).  All other systems reviewed and are negative.    Physical Exam Updated Vital Signs BP 152/67 (BP Location: Left Arm)   Pulse 80   Temp 98 F (36.7 C) (Oral)   Resp 16   SpO2 96%   Physical Exam  Constitutional: He is oriented to person, place, and time. He appears well-developed and well-nourished. No distress.  HENT:  Head: Normocephalic and atraumatic. Head is without raccoon's eyes, without Battle's sign, without abrasion, without contusion and without laceration.  Right Ear: Tympanic membrane normal. No hemotympanum.  Left Ear: Tympanic membrane normal. No hemotympanum.  Nose: Nose normal. No sinus tenderness, nasal deformity, septal deviation or nasal septal hematoma. No epistaxis.  Mouth/Throat: Uvula is midline, oropharynx is clear and moist and mucous membranes are normal. No oropharyngeal exudate, posterior oropharyngeal edema, posterior oropharyngeal erythema or tonsillar abscesses.  Eyes: Conjunctivae and EOM are normal. Pupils are equal, round, and reactive to light. Right eye exhibits no discharge. Left eye exhibits no discharge. No scleral icterus.  Neck: Normal range of motion. Neck supple.  Cardiovascular: Normal rate, regular rhythm, normal heart sounds and intact distal pulses.   Pulmonary/Chest: Effort normal and breath sounds normal. No respiratory distress. He has no wheezes. He has no rales.  Abdominal: Soft. Bowel sounds are normal. He exhibits no  distension and no mass. There is no tenderness. There is no rebound and no guarding. No hernia.  Musculoskeletal: He exhibits tenderness. He exhibits no edema.  No cervical, thoracic, or lumbar spine midline TTP.  Full ROM of left upper extremity and bilateral lower extremities with 5/5 strength.  Deformity and TTP present to right shoulder. Mild TTP over distal clavicle, no step-off, tenting or deformity palpated. Dec ROM of right are due to reported pain. FROM of right hand and wrist with equal grip strength bilaterally. 2+ radial pulse. Sensation grossly intact. Cap refill <2.  No TTP over hips or pelvic instability present. FROM of bilateral hips with 5/5 strength. 2+ PT pulses. Sensation grossly intact.   Neurological: He is alert and oriented to person, place, and time. He has normal strength. No cranial nerve deficit or sensory deficit. Coordination and gait normal.  Skin: Skin is warm and dry. He is not diaphoretic.  Nursing note and vitals reviewed.  ED Treatments / Results  Labs (all labs ordered are listed, but only abnormal results are displayed) Labs Reviewed  PROTIME-INR  CBC WITH DIFFERENTIAL/PLATELET  BASIC METABOLIC PANEL    EKG  EKG Interpretation None       Radiology No results found.  Procedures Procedures (including critical care time)  Medications Ordered in ED Medications  fentaNYL (SUBLIMAZE) injection 50 mcg (50 mcg Intravenous Given 04/22/16 0833)     Initial Impression / Assessment and Plan / ED Course  I have reviewed the triage vital signs and the nursing notes.  Pertinent labs & imaging results that were available during my care of the patient were reviewed by me and considered in my medical decision making (see chart for details).  Clinical Course     Patient presents status post mechanical fall that occurred prior to arrival. Denies head injury or LOC. Patient reports being on Coumadin. Reports pain to right shoulder. VSS. Exam  revealed mild deformity to right shoulder with tenderness to palpation and decreased range of motion. Right upper extremity otherwise neurovascular intact. No signs of head injury or trauma. No neuro deficits. No midline spinal tenderness. Remaining exam unremarkable. CT head negative. INR 2.7. Remaining labs appear to be consistent with patient's baseline values. Right shoulder x-ray showed displaced fracture of lateral right clavicle and right before meals joint injury. Sling applied in the ED. Discussed results with patient and family members. Plan to discharge patient home with pain meds, symptomatic treatment and advised to call his orthopedics office to schedule a follow-up appointment for this week. Discussed return precautions. Pt d/c home.   Final Clinical Impressions(s) / ED Diagnoses   Final diagnoses:  None    New Prescriptions New Prescriptions   No medications on file     Barrett Henleicole Elizabeth Nadeau, PA-C 04/22/16 1005    Linwood DibblesJon Knapp, MD 04/22/16 1012

## 2016-05-01 DIAGNOSIS — S42024A Nondisplaced fracture of shaft of right clavicle, initial encounter for closed fracture: Secondary | ICD-10-CM | POA: Diagnosis not present

## 2016-05-03 DIAGNOSIS — S42021D Displaced fracture of shaft of right clavicle, subsequent encounter for fracture with routine healing: Secondary | ICD-10-CM | POA: Diagnosis not present

## 2016-05-03 DIAGNOSIS — L89322 Pressure ulcer of left buttock, stage 2: Secondary | ICD-10-CM | POA: Diagnosis not present

## 2016-05-03 DIAGNOSIS — I482 Chronic atrial fibrillation: Secondary | ICD-10-CM | POA: Diagnosis not present

## 2016-05-03 DIAGNOSIS — E1140 Type 2 diabetes mellitus with diabetic neuropathy, unspecified: Secondary | ICD-10-CM | POA: Diagnosis not present

## 2016-05-03 DIAGNOSIS — I2510 Atherosclerotic heart disease of native coronary artery without angina pectoris: Secondary | ICD-10-CM | POA: Diagnosis not present

## 2016-05-03 DIAGNOSIS — I130 Hypertensive heart and chronic kidney disease with heart failure and stage 1 through stage 4 chronic kidney disease, or unspecified chronic kidney disease: Secondary | ICD-10-CM | POA: Diagnosis not present

## 2016-05-06 DIAGNOSIS — I2510 Atherosclerotic heart disease of native coronary artery without angina pectoris: Secondary | ICD-10-CM | POA: Diagnosis not present

## 2016-05-06 DIAGNOSIS — E1140 Type 2 diabetes mellitus with diabetic neuropathy, unspecified: Secondary | ICD-10-CM | POA: Diagnosis not present

## 2016-05-06 DIAGNOSIS — I130 Hypertensive heart and chronic kidney disease with heart failure and stage 1 through stage 4 chronic kidney disease, or unspecified chronic kidney disease: Secondary | ICD-10-CM | POA: Diagnosis not present

## 2016-05-06 DIAGNOSIS — L89322 Pressure ulcer of left buttock, stage 2: Secondary | ICD-10-CM | POA: Diagnosis not present

## 2016-05-06 DIAGNOSIS — I482 Chronic atrial fibrillation: Secondary | ICD-10-CM | POA: Diagnosis not present

## 2016-05-06 DIAGNOSIS — S42021D Displaced fracture of shaft of right clavicle, subsequent encounter for fracture with routine healing: Secondary | ICD-10-CM | POA: Diagnosis not present

## 2016-05-07 DIAGNOSIS — I482 Chronic atrial fibrillation: Secondary | ICD-10-CM | POA: Diagnosis not present

## 2016-05-07 DIAGNOSIS — S42021D Displaced fracture of shaft of right clavicle, subsequent encounter for fracture with routine healing: Secondary | ICD-10-CM | POA: Diagnosis not present

## 2016-05-07 DIAGNOSIS — I2510 Atherosclerotic heart disease of native coronary artery without angina pectoris: Secondary | ICD-10-CM | POA: Diagnosis not present

## 2016-05-07 DIAGNOSIS — I130 Hypertensive heart and chronic kidney disease with heart failure and stage 1 through stage 4 chronic kidney disease, or unspecified chronic kidney disease: Secondary | ICD-10-CM | POA: Diagnosis not present

## 2016-05-07 DIAGNOSIS — L89322 Pressure ulcer of left buttock, stage 2: Secondary | ICD-10-CM | POA: Diagnosis not present

## 2016-05-07 DIAGNOSIS — E1140 Type 2 diabetes mellitus with diabetic neuropathy, unspecified: Secondary | ICD-10-CM | POA: Diagnosis not present

## 2016-05-09 DIAGNOSIS — I130 Hypertensive heart and chronic kidney disease with heart failure and stage 1 through stage 4 chronic kidney disease, or unspecified chronic kidney disease: Secondary | ICD-10-CM | POA: Diagnosis not present

## 2016-05-09 DIAGNOSIS — I2510 Atherosclerotic heart disease of native coronary artery without angina pectoris: Secondary | ICD-10-CM | POA: Diagnosis not present

## 2016-05-09 DIAGNOSIS — E1140 Type 2 diabetes mellitus with diabetic neuropathy, unspecified: Secondary | ICD-10-CM | POA: Diagnosis not present

## 2016-05-09 DIAGNOSIS — I482 Chronic atrial fibrillation: Secondary | ICD-10-CM | POA: Diagnosis not present

## 2016-05-09 DIAGNOSIS — S42021D Displaced fracture of shaft of right clavicle, subsequent encounter for fracture with routine healing: Secondary | ICD-10-CM | POA: Diagnosis not present

## 2016-05-09 DIAGNOSIS — L89322 Pressure ulcer of left buttock, stage 2: Secondary | ICD-10-CM | POA: Diagnosis not present

## 2016-05-10 DIAGNOSIS — E1140 Type 2 diabetes mellitus with diabetic neuropathy, unspecified: Secondary | ICD-10-CM | POA: Diagnosis not present

## 2016-05-10 DIAGNOSIS — I2510 Atherosclerotic heart disease of native coronary artery without angina pectoris: Secondary | ICD-10-CM | POA: Diagnosis not present

## 2016-05-10 DIAGNOSIS — I130 Hypertensive heart and chronic kidney disease with heart failure and stage 1 through stage 4 chronic kidney disease, or unspecified chronic kidney disease: Secondary | ICD-10-CM | POA: Diagnosis not present

## 2016-05-10 DIAGNOSIS — S42021D Displaced fracture of shaft of right clavicle, subsequent encounter for fracture with routine healing: Secondary | ICD-10-CM | POA: Diagnosis not present

## 2016-05-10 DIAGNOSIS — L89322 Pressure ulcer of left buttock, stage 2: Secondary | ICD-10-CM | POA: Diagnosis not present

## 2016-05-10 DIAGNOSIS — I482 Chronic atrial fibrillation: Secondary | ICD-10-CM | POA: Diagnosis not present

## 2016-05-13 DIAGNOSIS — I2510 Atherosclerotic heart disease of native coronary artery without angina pectoris: Secondary | ICD-10-CM | POA: Diagnosis not present

## 2016-05-13 DIAGNOSIS — E1140 Type 2 diabetes mellitus with diabetic neuropathy, unspecified: Secondary | ICD-10-CM | POA: Diagnosis not present

## 2016-05-13 DIAGNOSIS — I482 Chronic atrial fibrillation: Secondary | ICD-10-CM | POA: Diagnosis not present

## 2016-05-13 DIAGNOSIS — I130 Hypertensive heart and chronic kidney disease with heart failure and stage 1 through stage 4 chronic kidney disease, or unspecified chronic kidney disease: Secondary | ICD-10-CM | POA: Diagnosis not present

## 2016-05-13 DIAGNOSIS — S42021D Displaced fracture of shaft of right clavicle, subsequent encounter for fracture with routine healing: Secondary | ICD-10-CM | POA: Diagnosis not present

## 2016-05-13 DIAGNOSIS — L89322 Pressure ulcer of left buttock, stage 2: Secondary | ICD-10-CM | POA: Diagnosis not present

## 2016-05-14 DIAGNOSIS — I2510 Atherosclerotic heart disease of native coronary artery without angina pectoris: Secondary | ICD-10-CM | POA: Diagnosis not present

## 2016-05-14 DIAGNOSIS — I130 Hypertensive heart and chronic kidney disease with heart failure and stage 1 through stage 4 chronic kidney disease, or unspecified chronic kidney disease: Secondary | ICD-10-CM | POA: Diagnosis not present

## 2016-05-14 DIAGNOSIS — E1140 Type 2 diabetes mellitus with diabetic neuropathy, unspecified: Secondary | ICD-10-CM | POA: Diagnosis not present

## 2016-05-14 DIAGNOSIS — S42021D Displaced fracture of shaft of right clavicle, subsequent encounter for fracture with routine healing: Secondary | ICD-10-CM | POA: Diagnosis not present

## 2016-05-14 DIAGNOSIS — L89322 Pressure ulcer of left buttock, stage 2: Secondary | ICD-10-CM | POA: Diagnosis not present

## 2016-05-14 DIAGNOSIS — I482 Chronic atrial fibrillation: Secondary | ICD-10-CM | POA: Diagnosis not present

## 2016-05-16 ENCOUNTER — Ambulatory Visit (INDEPENDENT_AMBULATORY_CARE_PROVIDER_SITE_OTHER): Payer: Commercial Managed Care - HMO | Admitting: *Deleted

## 2016-05-16 DIAGNOSIS — I442 Atrioventricular block, complete: Secondary | ICD-10-CM | POA: Diagnosis not present

## 2016-05-17 ENCOUNTER — Encounter: Payer: Self-pay | Admitting: Internal Medicine

## 2016-05-17 DIAGNOSIS — S42021D Displaced fracture of shaft of right clavicle, subsequent encounter for fracture with routine healing: Secondary | ICD-10-CM | POA: Diagnosis not present

## 2016-05-17 DIAGNOSIS — L89322 Pressure ulcer of left buttock, stage 2: Secondary | ICD-10-CM | POA: Diagnosis not present

## 2016-05-17 DIAGNOSIS — I482 Chronic atrial fibrillation: Secondary | ICD-10-CM | POA: Diagnosis not present

## 2016-05-17 DIAGNOSIS — E1140 Type 2 diabetes mellitus with diabetic neuropathy, unspecified: Secondary | ICD-10-CM | POA: Diagnosis not present

## 2016-05-17 DIAGNOSIS — I2510 Atherosclerotic heart disease of native coronary artery without angina pectoris: Secondary | ICD-10-CM | POA: Diagnosis not present

## 2016-05-17 DIAGNOSIS — I130 Hypertensive heart and chronic kidney disease with heart failure and stage 1 through stage 4 chronic kidney disease, or unspecified chronic kidney disease: Secondary | ICD-10-CM | POA: Diagnosis not present

## 2016-05-17 LAB — CUP PACEART REMOTE DEVICE CHECK
Brady Statistic AP VP Percent: 0 %
Brady Statistic AP VS Percent: 0 %
Brady Statistic AS VP Percent: 0 %
Brady Statistic AS VS Percent: 0 %
Date Time Interrogation Session: 20180118183220
Implantable Lead Implant Date: 20120113
Implantable Lead Implant Date: 20120113
Implantable Lead Location: 753858
Implantable Lead Model: 5076
Implantable Pulse Generator Implant Date: 20111019
Lead Channel Impedance Value: 266 Ohm
Lead Channel Impedance Value: 399 Ohm
Lead Channel Impedance Value: 4047 Ohm
Lead Channel Impedance Value: 4047 Ohm
Lead Channel Impedance Value: 437 Ohm
Lead Channel Impedance Value: 494 Ohm
Lead Channel Pacing Threshold Pulse Width: 0.4 ms
Lead Channel Pacing Threshold Pulse Width: 0.4 ms
Lead Channel Sensing Intrinsic Amplitude: 0.5 mV
Lead Channel Sensing Intrinsic Amplitude: 4.5 mV
Lead Channel Sensing Intrinsic Amplitude: 4.5 mV
Lead Channel Setting Pacing Amplitude: 1.5 V
Lead Channel Setting Pacing Amplitude: 2 V
Lead Channel Setting Pacing Pulse Width: 0.4 ms
Lead Channel Setting Pacing Pulse Width: 0.4 ms
MDC IDC LEAD IMPLANT DT: 20120113
MDC IDC LEAD LOCATION: 753859
MDC IDC LEAD LOCATION: 753860
MDC IDC MSMT BATTERY REMAINING LONGEVITY: 44 mo
MDC IDC MSMT BATTERY VOLTAGE: 2.96 V
MDC IDC MSMT LEADCHNL LV IMPEDANCE VALUE: 4047 Ohm
MDC IDC MSMT LEADCHNL LV IMPEDANCE VALUE: 608 Ohm
MDC IDC MSMT LEADCHNL LV PACING THRESHOLD AMPLITUDE: 1 V
MDC IDC MSMT LEADCHNL RA PACING THRESHOLD AMPLITUDE: 0.875 V
MDC IDC MSMT LEADCHNL RA SENSING INTR AMPL: 1.125 mV
MDC IDC MSMT LEADCHNL RV IMPEDANCE VALUE: 361 Ohm
MDC IDC MSMT LEADCHNL RV PACING THRESHOLD AMPLITUDE: 0.75 V
MDC IDC MSMT LEADCHNL RV PACING THRESHOLD PULSEWIDTH: 0.4 ms
MDC IDC SET LEADCHNL RV SENSING SENSITIVITY: 0.9 mV
MDC IDC STAT BRADY RA PERCENT PACED: 0 %
MDC IDC STAT BRADY RV PERCENT PACED: 99.85 %

## 2016-05-17 NOTE — Progress Notes (Signed)
Remote pacemaker transmission.   

## 2016-05-18 DIAGNOSIS — I130 Hypertensive heart and chronic kidney disease with heart failure and stage 1 through stage 4 chronic kidney disease, or unspecified chronic kidney disease: Secondary | ICD-10-CM | POA: Diagnosis not present

## 2016-05-18 DIAGNOSIS — S42021D Displaced fracture of shaft of right clavicle, subsequent encounter for fracture with routine healing: Secondary | ICD-10-CM | POA: Diagnosis not present

## 2016-05-18 DIAGNOSIS — L89322 Pressure ulcer of left buttock, stage 2: Secondary | ICD-10-CM | POA: Diagnosis not present

## 2016-05-18 DIAGNOSIS — E1140 Type 2 diabetes mellitus with diabetic neuropathy, unspecified: Secondary | ICD-10-CM | POA: Diagnosis not present

## 2016-05-18 DIAGNOSIS — I482 Chronic atrial fibrillation: Secondary | ICD-10-CM | POA: Diagnosis not present

## 2016-05-18 DIAGNOSIS — I2510 Atherosclerotic heart disease of native coronary artery without angina pectoris: Secondary | ICD-10-CM | POA: Diagnosis not present

## 2016-05-20 DIAGNOSIS — L89322 Pressure ulcer of left buttock, stage 2: Secondary | ICD-10-CM | POA: Diagnosis not present

## 2016-05-20 DIAGNOSIS — I130 Hypertensive heart and chronic kidney disease with heart failure and stage 1 through stage 4 chronic kidney disease, or unspecified chronic kidney disease: Secondary | ICD-10-CM | POA: Diagnosis not present

## 2016-05-20 DIAGNOSIS — E1140 Type 2 diabetes mellitus with diabetic neuropathy, unspecified: Secondary | ICD-10-CM | POA: Diagnosis not present

## 2016-05-20 DIAGNOSIS — S42021D Displaced fracture of shaft of right clavicle, subsequent encounter for fracture with routine healing: Secondary | ICD-10-CM | POA: Diagnosis not present

## 2016-05-20 DIAGNOSIS — I2510 Atherosclerotic heart disease of native coronary artery without angina pectoris: Secondary | ICD-10-CM | POA: Diagnosis not present

## 2016-05-20 DIAGNOSIS — I482 Chronic atrial fibrillation: Secondary | ICD-10-CM | POA: Diagnosis not present

## 2016-05-21 DIAGNOSIS — I130 Hypertensive heart and chronic kidney disease with heart failure and stage 1 through stage 4 chronic kidney disease, or unspecified chronic kidney disease: Secondary | ICD-10-CM | POA: Diagnosis not present

## 2016-05-21 DIAGNOSIS — S42021D Displaced fracture of shaft of right clavicle, subsequent encounter for fracture with routine healing: Secondary | ICD-10-CM | POA: Diagnosis not present

## 2016-05-21 DIAGNOSIS — E1140 Type 2 diabetes mellitus with diabetic neuropathy, unspecified: Secondary | ICD-10-CM | POA: Diagnosis not present

## 2016-05-21 DIAGNOSIS — I482 Chronic atrial fibrillation: Secondary | ICD-10-CM | POA: Diagnosis not present

## 2016-05-21 DIAGNOSIS — I2510 Atherosclerotic heart disease of native coronary artery without angina pectoris: Secondary | ICD-10-CM | POA: Diagnosis not present

## 2016-05-21 DIAGNOSIS — L89322 Pressure ulcer of left buttock, stage 2: Secondary | ICD-10-CM | POA: Diagnosis not present

## 2016-05-22 ENCOUNTER — Encounter: Payer: Self-pay | Admitting: Cardiology

## 2016-05-23 DIAGNOSIS — I130 Hypertensive heart and chronic kidney disease with heart failure and stage 1 through stage 4 chronic kidney disease, or unspecified chronic kidney disease: Secondary | ICD-10-CM | POA: Diagnosis not present

## 2016-05-23 DIAGNOSIS — I482 Chronic atrial fibrillation: Secondary | ICD-10-CM | POA: Diagnosis not present

## 2016-05-23 DIAGNOSIS — I2510 Atherosclerotic heart disease of native coronary artery without angina pectoris: Secondary | ICD-10-CM | POA: Diagnosis not present

## 2016-05-23 DIAGNOSIS — L89322 Pressure ulcer of left buttock, stage 2: Secondary | ICD-10-CM | POA: Diagnosis not present

## 2016-05-23 DIAGNOSIS — E1140 Type 2 diabetes mellitus with diabetic neuropathy, unspecified: Secondary | ICD-10-CM | POA: Diagnosis not present

## 2016-05-23 DIAGNOSIS — S42021D Displaced fracture of shaft of right clavicle, subsequent encounter for fracture with routine healing: Secondary | ICD-10-CM | POA: Diagnosis not present

## 2016-05-24 DIAGNOSIS — E1140 Type 2 diabetes mellitus with diabetic neuropathy, unspecified: Secondary | ICD-10-CM | POA: Diagnosis not present

## 2016-05-24 DIAGNOSIS — I130 Hypertensive heart and chronic kidney disease with heart failure and stage 1 through stage 4 chronic kidney disease, or unspecified chronic kidney disease: Secondary | ICD-10-CM | POA: Diagnosis not present

## 2016-05-24 DIAGNOSIS — I482 Chronic atrial fibrillation: Secondary | ICD-10-CM | POA: Diagnosis not present

## 2016-05-24 DIAGNOSIS — S42021D Displaced fracture of shaft of right clavicle, subsequent encounter for fracture with routine healing: Secondary | ICD-10-CM | POA: Diagnosis not present

## 2016-05-24 DIAGNOSIS — I2510 Atherosclerotic heart disease of native coronary artery without angina pectoris: Secondary | ICD-10-CM | POA: Diagnosis not present

## 2016-05-24 DIAGNOSIS — L89322 Pressure ulcer of left buttock, stage 2: Secondary | ICD-10-CM | POA: Diagnosis not present

## 2016-05-27 DIAGNOSIS — I482 Chronic atrial fibrillation: Secondary | ICD-10-CM | POA: Diagnosis not present

## 2016-05-27 DIAGNOSIS — S42021D Displaced fracture of shaft of right clavicle, subsequent encounter for fracture with routine healing: Secondary | ICD-10-CM | POA: Diagnosis not present

## 2016-05-27 DIAGNOSIS — L89322 Pressure ulcer of left buttock, stage 2: Secondary | ICD-10-CM | POA: Diagnosis not present

## 2016-05-27 DIAGNOSIS — E1140 Type 2 diabetes mellitus with diabetic neuropathy, unspecified: Secondary | ICD-10-CM | POA: Diagnosis not present

## 2016-05-27 DIAGNOSIS — I2510 Atherosclerotic heart disease of native coronary artery without angina pectoris: Secondary | ICD-10-CM | POA: Diagnosis not present

## 2016-05-27 DIAGNOSIS — I130 Hypertensive heart and chronic kidney disease with heart failure and stage 1 through stage 4 chronic kidney disease, or unspecified chronic kidney disease: Secondary | ICD-10-CM | POA: Diagnosis not present

## 2016-05-28 DIAGNOSIS — I2510 Atherosclerotic heart disease of native coronary artery without angina pectoris: Secondary | ICD-10-CM | POA: Diagnosis not present

## 2016-05-28 DIAGNOSIS — E1140 Type 2 diabetes mellitus with diabetic neuropathy, unspecified: Secondary | ICD-10-CM | POA: Diagnosis not present

## 2016-05-28 DIAGNOSIS — I482 Chronic atrial fibrillation: Secondary | ICD-10-CM | POA: Diagnosis not present

## 2016-05-28 DIAGNOSIS — S42021D Displaced fracture of shaft of right clavicle, subsequent encounter for fracture with routine healing: Secondary | ICD-10-CM | POA: Diagnosis not present

## 2016-05-28 DIAGNOSIS — I130 Hypertensive heart and chronic kidney disease with heart failure and stage 1 through stage 4 chronic kidney disease, or unspecified chronic kidney disease: Secondary | ICD-10-CM | POA: Diagnosis not present

## 2016-05-28 DIAGNOSIS — L89322 Pressure ulcer of left buttock, stage 2: Secondary | ICD-10-CM | POA: Diagnosis not present

## 2016-05-29 DIAGNOSIS — S42024D Nondisplaced fracture of shaft of right clavicle, subsequent encounter for fracture with routine healing: Secondary | ICD-10-CM | POA: Diagnosis not present

## 2016-05-30 DIAGNOSIS — S42021D Displaced fracture of shaft of right clavicle, subsequent encounter for fracture with routine healing: Secondary | ICD-10-CM | POA: Diagnosis not present

## 2016-05-30 DIAGNOSIS — I130 Hypertensive heart and chronic kidney disease with heart failure and stage 1 through stage 4 chronic kidney disease, or unspecified chronic kidney disease: Secondary | ICD-10-CM | POA: Diagnosis not present

## 2016-05-30 DIAGNOSIS — I2510 Atherosclerotic heart disease of native coronary artery without angina pectoris: Secondary | ICD-10-CM | POA: Diagnosis not present

## 2016-05-30 DIAGNOSIS — I482 Chronic atrial fibrillation: Secondary | ICD-10-CM | POA: Diagnosis not present

## 2016-05-30 DIAGNOSIS — E1140 Type 2 diabetes mellitus with diabetic neuropathy, unspecified: Secondary | ICD-10-CM | POA: Diagnosis not present

## 2016-05-30 DIAGNOSIS — L89322 Pressure ulcer of left buttock, stage 2: Secondary | ICD-10-CM | POA: Diagnosis not present

## 2016-05-31 DIAGNOSIS — I2510 Atherosclerotic heart disease of native coronary artery without angina pectoris: Secondary | ICD-10-CM | POA: Diagnosis not present

## 2016-05-31 DIAGNOSIS — E1140 Type 2 diabetes mellitus with diabetic neuropathy, unspecified: Secondary | ICD-10-CM | POA: Diagnosis not present

## 2016-05-31 DIAGNOSIS — I482 Chronic atrial fibrillation: Secondary | ICD-10-CM | POA: Diagnosis not present

## 2016-05-31 DIAGNOSIS — L89322 Pressure ulcer of left buttock, stage 2: Secondary | ICD-10-CM | POA: Diagnosis not present

## 2016-05-31 DIAGNOSIS — S42021D Displaced fracture of shaft of right clavicle, subsequent encounter for fracture with routine healing: Secondary | ICD-10-CM | POA: Diagnosis not present

## 2016-05-31 DIAGNOSIS — I130 Hypertensive heart and chronic kidney disease with heart failure and stage 1 through stage 4 chronic kidney disease, or unspecified chronic kidney disease: Secondary | ICD-10-CM | POA: Diagnosis not present

## 2016-06-03 DIAGNOSIS — I2510 Atherosclerotic heart disease of native coronary artery without angina pectoris: Secondary | ICD-10-CM | POA: Diagnosis not present

## 2016-06-03 DIAGNOSIS — I130 Hypertensive heart and chronic kidney disease with heart failure and stage 1 through stage 4 chronic kidney disease, or unspecified chronic kidney disease: Secondary | ICD-10-CM | POA: Diagnosis not present

## 2016-06-03 DIAGNOSIS — E1140 Type 2 diabetes mellitus with diabetic neuropathy, unspecified: Secondary | ICD-10-CM | POA: Diagnosis not present

## 2016-06-03 DIAGNOSIS — I482 Chronic atrial fibrillation: Secondary | ICD-10-CM | POA: Diagnosis not present

## 2016-06-03 DIAGNOSIS — S42021D Displaced fracture of shaft of right clavicle, subsequent encounter for fracture with routine healing: Secondary | ICD-10-CM | POA: Diagnosis not present

## 2016-06-03 DIAGNOSIS — L89322 Pressure ulcer of left buttock, stage 2: Secondary | ICD-10-CM | POA: Diagnosis not present

## 2016-06-04 DIAGNOSIS — I2510 Atherosclerotic heart disease of native coronary artery without angina pectoris: Secondary | ICD-10-CM | POA: Diagnosis not present

## 2016-06-04 DIAGNOSIS — I482 Chronic atrial fibrillation: Secondary | ICD-10-CM | POA: Diagnosis not present

## 2016-06-04 DIAGNOSIS — E1140 Type 2 diabetes mellitus with diabetic neuropathy, unspecified: Secondary | ICD-10-CM | POA: Diagnosis not present

## 2016-06-04 DIAGNOSIS — S42021D Displaced fracture of shaft of right clavicle, subsequent encounter for fracture with routine healing: Secondary | ICD-10-CM | POA: Diagnosis not present

## 2016-06-04 DIAGNOSIS — I130 Hypertensive heart and chronic kidney disease with heart failure and stage 1 through stage 4 chronic kidney disease, or unspecified chronic kidney disease: Secondary | ICD-10-CM | POA: Diagnosis not present

## 2016-06-04 DIAGNOSIS — L89322 Pressure ulcer of left buttock, stage 2: Secondary | ICD-10-CM | POA: Diagnosis not present

## 2016-06-05 DIAGNOSIS — I2510 Atherosclerotic heart disease of native coronary artery without angina pectoris: Secondary | ICD-10-CM | POA: Diagnosis not present

## 2016-06-05 DIAGNOSIS — I482 Chronic atrial fibrillation: Secondary | ICD-10-CM | POA: Diagnosis not present

## 2016-06-05 DIAGNOSIS — I130 Hypertensive heart and chronic kidney disease with heart failure and stage 1 through stage 4 chronic kidney disease, or unspecified chronic kidney disease: Secondary | ICD-10-CM | POA: Diagnosis not present

## 2016-06-05 DIAGNOSIS — S42021D Displaced fracture of shaft of right clavicle, subsequent encounter for fracture with routine healing: Secondary | ICD-10-CM | POA: Diagnosis not present

## 2016-06-05 DIAGNOSIS — E1140 Type 2 diabetes mellitus with diabetic neuropathy, unspecified: Secondary | ICD-10-CM | POA: Diagnosis not present

## 2016-06-05 DIAGNOSIS — L89322 Pressure ulcer of left buttock, stage 2: Secondary | ICD-10-CM | POA: Diagnosis not present

## 2016-06-06 DIAGNOSIS — I482 Chronic atrial fibrillation: Secondary | ICD-10-CM | POA: Diagnosis not present

## 2016-06-06 DIAGNOSIS — E1140 Type 2 diabetes mellitus with diabetic neuropathy, unspecified: Secondary | ICD-10-CM | POA: Diagnosis not present

## 2016-06-06 DIAGNOSIS — L89322 Pressure ulcer of left buttock, stage 2: Secondary | ICD-10-CM | POA: Diagnosis not present

## 2016-06-06 DIAGNOSIS — S42021D Displaced fracture of shaft of right clavicle, subsequent encounter for fracture with routine healing: Secondary | ICD-10-CM | POA: Diagnosis not present

## 2016-06-06 DIAGNOSIS — I130 Hypertensive heart and chronic kidney disease with heart failure and stage 1 through stage 4 chronic kidney disease, or unspecified chronic kidney disease: Secondary | ICD-10-CM | POA: Diagnosis not present

## 2016-06-06 DIAGNOSIS — I2510 Atherosclerotic heart disease of native coronary artery without angina pectoris: Secondary | ICD-10-CM | POA: Diagnosis not present

## 2016-06-11 DIAGNOSIS — E1140 Type 2 diabetes mellitus with diabetic neuropathy, unspecified: Secondary | ICD-10-CM | POA: Diagnosis not present

## 2016-06-11 DIAGNOSIS — I130 Hypertensive heart and chronic kidney disease with heart failure and stage 1 through stage 4 chronic kidney disease, or unspecified chronic kidney disease: Secondary | ICD-10-CM | POA: Diagnosis not present

## 2016-06-11 DIAGNOSIS — L89322 Pressure ulcer of left buttock, stage 2: Secondary | ICD-10-CM | POA: Diagnosis not present

## 2016-06-11 DIAGNOSIS — I482 Chronic atrial fibrillation: Secondary | ICD-10-CM | POA: Diagnosis not present

## 2016-06-11 DIAGNOSIS — I2510 Atherosclerotic heart disease of native coronary artery without angina pectoris: Secondary | ICD-10-CM | POA: Diagnosis not present

## 2016-06-11 DIAGNOSIS — S42021D Displaced fracture of shaft of right clavicle, subsequent encounter for fracture with routine healing: Secondary | ICD-10-CM | POA: Diagnosis not present

## 2016-06-12 DIAGNOSIS — I482 Chronic atrial fibrillation: Secondary | ICD-10-CM | POA: Diagnosis not present

## 2016-06-12 DIAGNOSIS — L89322 Pressure ulcer of left buttock, stage 2: Secondary | ICD-10-CM | POA: Diagnosis not present

## 2016-06-12 DIAGNOSIS — I130 Hypertensive heart and chronic kidney disease with heart failure and stage 1 through stage 4 chronic kidney disease, or unspecified chronic kidney disease: Secondary | ICD-10-CM | POA: Diagnosis not present

## 2016-06-12 DIAGNOSIS — I2510 Atherosclerotic heart disease of native coronary artery without angina pectoris: Secondary | ICD-10-CM | POA: Diagnosis not present

## 2016-06-12 DIAGNOSIS — E1140 Type 2 diabetes mellitus with diabetic neuropathy, unspecified: Secondary | ICD-10-CM | POA: Diagnosis not present

## 2016-06-12 DIAGNOSIS — S42021D Displaced fracture of shaft of right clavicle, subsequent encounter for fracture with routine healing: Secondary | ICD-10-CM | POA: Diagnosis not present

## 2016-06-13 DIAGNOSIS — I130 Hypertensive heart and chronic kidney disease with heart failure and stage 1 through stage 4 chronic kidney disease, or unspecified chronic kidney disease: Secondary | ICD-10-CM | POA: Diagnosis not present

## 2016-06-13 DIAGNOSIS — I482 Chronic atrial fibrillation: Secondary | ICD-10-CM | POA: Diagnosis not present

## 2016-06-13 DIAGNOSIS — L89322 Pressure ulcer of left buttock, stage 2: Secondary | ICD-10-CM | POA: Diagnosis not present

## 2016-06-13 DIAGNOSIS — E1140 Type 2 diabetes mellitus with diabetic neuropathy, unspecified: Secondary | ICD-10-CM | POA: Diagnosis not present

## 2016-06-13 DIAGNOSIS — I2510 Atherosclerotic heart disease of native coronary artery without angina pectoris: Secondary | ICD-10-CM | POA: Diagnosis not present

## 2016-06-13 DIAGNOSIS — S42021D Displaced fracture of shaft of right clavicle, subsequent encounter for fracture with routine healing: Secondary | ICD-10-CM | POA: Diagnosis not present

## 2016-06-14 DIAGNOSIS — I482 Chronic atrial fibrillation: Secondary | ICD-10-CM | POA: Diagnosis not present

## 2016-06-14 DIAGNOSIS — I2510 Atherosclerotic heart disease of native coronary artery without angina pectoris: Secondary | ICD-10-CM | POA: Diagnosis not present

## 2016-06-14 DIAGNOSIS — S42021D Displaced fracture of shaft of right clavicle, subsequent encounter for fracture with routine healing: Secondary | ICD-10-CM | POA: Diagnosis not present

## 2016-06-14 DIAGNOSIS — I130 Hypertensive heart and chronic kidney disease with heart failure and stage 1 through stage 4 chronic kidney disease, or unspecified chronic kidney disease: Secondary | ICD-10-CM | POA: Diagnosis not present

## 2016-06-14 DIAGNOSIS — L89322 Pressure ulcer of left buttock, stage 2: Secondary | ICD-10-CM | POA: Diagnosis not present

## 2016-06-14 DIAGNOSIS — E1140 Type 2 diabetes mellitus with diabetic neuropathy, unspecified: Secondary | ICD-10-CM | POA: Diagnosis not present

## 2016-06-17 DIAGNOSIS — L89322 Pressure ulcer of left buttock, stage 2: Secondary | ICD-10-CM | POA: Diagnosis not present

## 2016-06-17 DIAGNOSIS — I482 Chronic atrial fibrillation: Secondary | ICD-10-CM | POA: Diagnosis not present

## 2016-06-17 DIAGNOSIS — I2510 Atherosclerotic heart disease of native coronary artery without angina pectoris: Secondary | ICD-10-CM | POA: Diagnosis not present

## 2016-06-17 DIAGNOSIS — E1140 Type 2 diabetes mellitus with diabetic neuropathy, unspecified: Secondary | ICD-10-CM | POA: Diagnosis not present

## 2016-06-17 DIAGNOSIS — I130 Hypertensive heart and chronic kidney disease with heart failure and stage 1 through stage 4 chronic kidney disease, or unspecified chronic kidney disease: Secondary | ICD-10-CM | POA: Diagnosis not present

## 2016-06-17 DIAGNOSIS — S42021D Displaced fracture of shaft of right clavicle, subsequent encounter for fracture with routine healing: Secondary | ICD-10-CM | POA: Diagnosis not present

## 2016-06-18 DIAGNOSIS — E1140 Type 2 diabetes mellitus with diabetic neuropathy, unspecified: Secondary | ICD-10-CM | POA: Diagnosis not present

## 2016-06-18 DIAGNOSIS — L89322 Pressure ulcer of left buttock, stage 2: Secondary | ICD-10-CM | POA: Diagnosis not present

## 2016-06-18 DIAGNOSIS — I130 Hypertensive heart and chronic kidney disease with heart failure and stage 1 through stage 4 chronic kidney disease, or unspecified chronic kidney disease: Secondary | ICD-10-CM | POA: Diagnosis not present

## 2016-06-18 DIAGNOSIS — S42021D Displaced fracture of shaft of right clavicle, subsequent encounter for fracture with routine healing: Secondary | ICD-10-CM | POA: Diagnosis not present

## 2016-06-18 DIAGNOSIS — I2510 Atherosclerotic heart disease of native coronary artery without angina pectoris: Secondary | ICD-10-CM | POA: Diagnosis not present

## 2016-06-18 DIAGNOSIS — I482 Chronic atrial fibrillation: Secondary | ICD-10-CM | POA: Diagnosis not present

## 2016-06-20 DIAGNOSIS — E1140 Type 2 diabetes mellitus with diabetic neuropathy, unspecified: Secondary | ICD-10-CM | POA: Diagnosis not present

## 2016-06-20 DIAGNOSIS — L89322 Pressure ulcer of left buttock, stage 2: Secondary | ICD-10-CM | POA: Diagnosis not present

## 2016-06-20 DIAGNOSIS — S42021D Displaced fracture of shaft of right clavicle, subsequent encounter for fracture with routine healing: Secondary | ICD-10-CM | POA: Diagnosis not present

## 2016-06-20 DIAGNOSIS — I2510 Atherosclerotic heart disease of native coronary artery without angina pectoris: Secondary | ICD-10-CM | POA: Diagnosis not present

## 2016-06-20 DIAGNOSIS — I482 Chronic atrial fibrillation: Secondary | ICD-10-CM | POA: Diagnosis not present

## 2016-06-20 DIAGNOSIS — I130 Hypertensive heart and chronic kidney disease with heart failure and stage 1 through stage 4 chronic kidney disease, or unspecified chronic kidney disease: Secondary | ICD-10-CM | POA: Diagnosis not present

## 2016-06-21 DIAGNOSIS — E1140 Type 2 diabetes mellitus with diabetic neuropathy, unspecified: Secondary | ICD-10-CM | POA: Diagnosis not present

## 2016-06-21 DIAGNOSIS — I482 Chronic atrial fibrillation: Secondary | ICD-10-CM | POA: Diagnosis not present

## 2016-06-21 DIAGNOSIS — L89322 Pressure ulcer of left buttock, stage 2: Secondary | ICD-10-CM | POA: Diagnosis not present

## 2016-06-21 DIAGNOSIS — I2510 Atherosclerotic heart disease of native coronary artery without angina pectoris: Secondary | ICD-10-CM | POA: Diagnosis not present

## 2016-06-21 DIAGNOSIS — I130 Hypertensive heart and chronic kidney disease with heart failure and stage 1 through stage 4 chronic kidney disease, or unspecified chronic kidney disease: Secondary | ICD-10-CM | POA: Diagnosis not present

## 2016-06-21 DIAGNOSIS — S42021D Displaced fracture of shaft of right clavicle, subsequent encounter for fracture with routine healing: Secondary | ICD-10-CM | POA: Diagnosis not present

## 2016-06-24 DIAGNOSIS — I482 Chronic atrial fibrillation: Secondary | ICD-10-CM | POA: Diagnosis not present

## 2016-06-24 DIAGNOSIS — L89322 Pressure ulcer of left buttock, stage 2: Secondary | ICD-10-CM | POA: Diagnosis not present

## 2016-06-24 DIAGNOSIS — I2510 Atherosclerotic heart disease of native coronary artery without angina pectoris: Secondary | ICD-10-CM | POA: Diagnosis not present

## 2016-06-24 DIAGNOSIS — I130 Hypertensive heart and chronic kidney disease with heart failure and stage 1 through stage 4 chronic kidney disease, or unspecified chronic kidney disease: Secondary | ICD-10-CM | POA: Diagnosis not present

## 2016-06-24 DIAGNOSIS — S42021D Displaced fracture of shaft of right clavicle, subsequent encounter for fracture with routine healing: Secondary | ICD-10-CM | POA: Diagnosis not present

## 2016-06-24 DIAGNOSIS — E1140 Type 2 diabetes mellitus with diabetic neuropathy, unspecified: Secondary | ICD-10-CM | POA: Diagnosis not present

## 2016-06-25 DIAGNOSIS — I2510 Atherosclerotic heart disease of native coronary artery without angina pectoris: Secondary | ICD-10-CM | POA: Diagnosis not present

## 2016-06-25 DIAGNOSIS — E1140 Type 2 diabetes mellitus with diabetic neuropathy, unspecified: Secondary | ICD-10-CM | POA: Diagnosis not present

## 2016-06-25 DIAGNOSIS — L89322 Pressure ulcer of left buttock, stage 2: Secondary | ICD-10-CM | POA: Diagnosis not present

## 2016-06-25 DIAGNOSIS — S42021D Displaced fracture of shaft of right clavicle, subsequent encounter for fracture with routine healing: Secondary | ICD-10-CM | POA: Diagnosis not present

## 2016-06-25 DIAGNOSIS — I130 Hypertensive heart and chronic kidney disease with heart failure and stage 1 through stage 4 chronic kidney disease, or unspecified chronic kidney disease: Secondary | ICD-10-CM | POA: Diagnosis not present

## 2016-06-25 DIAGNOSIS — I482 Chronic atrial fibrillation: Secondary | ICD-10-CM | POA: Diagnosis not present

## 2016-06-26 DIAGNOSIS — S42024D Nondisplaced fracture of shaft of right clavicle, subsequent encounter for fracture with routine healing: Secondary | ICD-10-CM | POA: Diagnosis not present

## 2016-06-27 DIAGNOSIS — I2510 Atherosclerotic heart disease of native coronary artery without angina pectoris: Secondary | ICD-10-CM | POA: Diagnosis not present

## 2016-06-27 DIAGNOSIS — S42021D Displaced fracture of shaft of right clavicle, subsequent encounter for fracture with routine healing: Secondary | ICD-10-CM | POA: Diagnosis not present

## 2016-06-27 DIAGNOSIS — L89322 Pressure ulcer of left buttock, stage 2: Secondary | ICD-10-CM | POA: Diagnosis not present

## 2016-06-27 DIAGNOSIS — I482 Chronic atrial fibrillation: Secondary | ICD-10-CM | POA: Diagnosis not present

## 2016-06-27 DIAGNOSIS — E1140 Type 2 diabetes mellitus with diabetic neuropathy, unspecified: Secondary | ICD-10-CM | POA: Diagnosis not present

## 2016-06-27 DIAGNOSIS — I130 Hypertensive heart and chronic kidney disease with heart failure and stage 1 through stage 4 chronic kidney disease, or unspecified chronic kidney disease: Secondary | ICD-10-CM | POA: Diagnosis not present

## 2016-06-28 DIAGNOSIS — L89322 Pressure ulcer of left buttock, stage 2: Secondary | ICD-10-CM | POA: Diagnosis not present

## 2016-06-28 DIAGNOSIS — S42021D Displaced fracture of shaft of right clavicle, subsequent encounter for fracture with routine healing: Secondary | ICD-10-CM | POA: Diagnosis not present

## 2016-06-28 DIAGNOSIS — E1140 Type 2 diabetes mellitus with diabetic neuropathy, unspecified: Secondary | ICD-10-CM | POA: Diagnosis not present

## 2016-06-28 DIAGNOSIS — I2510 Atherosclerotic heart disease of native coronary artery without angina pectoris: Secondary | ICD-10-CM | POA: Diagnosis not present

## 2016-06-28 DIAGNOSIS — I130 Hypertensive heart and chronic kidney disease with heart failure and stage 1 through stage 4 chronic kidney disease, or unspecified chronic kidney disease: Secondary | ICD-10-CM | POA: Diagnosis not present

## 2016-06-28 DIAGNOSIS — I482 Chronic atrial fibrillation: Secondary | ICD-10-CM | POA: Diagnosis not present

## 2016-07-02 DIAGNOSIS — S42021D Displaced fracture of shaft of right clavicle, subsequent encounter for fracture with routine healing: Secondary | ICD-10-CM | POA: Diagnosis not present

## 2016-07-02 DIAGNOSIS — I130 Hypertensive heart and chronic kidney disease with heart failure and stage 1 through stage 4 chronic kidney disease, or unspecified chronic kidney disease: Secondary | ICD-10-CM | POA: Diagnosis not present

## 2016-07-02 DIAGNOSIS — E1140 Type 2 diabetes mellitus with diabetic neuropathy, unspecified: Secondary | ICD-10-CM | POA: Diagnosis not present

## 2016-07-02 DIAGNOSIS — R262 Difficulty in walking, not elsewhere classified: Secondary | ICD-10-CM | POA: Diagnosis not present

## 2016-07-02 DIAGNOSIS — S51801D Unspecified open wound of right forearm, subsequent encounter: Secondary | ICD-10-CM | POA: Diagnosis not present

## 2016-07-02 DIAGNOSIS — I482 Chronic atrial fibrillation: Secondary | ICD-10-CM | POA: Diagnosis not present

## 2016-07-02 NOTE — Progress Notes (Signed)
Electrophysiology Office Note Date: 07/04/2016  ID:  Joseph Watts, DOB 08-18-1926, MRN [id number]  PCP: Alysia Penna, MD Primary Cardiologist: Anne Fu Electrophysiologist: Allred  CC: Pacemaker follow-up  Joseph Watts is a 83 y.o. male seen today for Dr Johney Frame.  He presents today for routine electrophysiology followup.  Since last being seen in our clinic, the patient reports doing relatively well.  He had a fall around Christmas and broke his clavicle. He is currently undergoing rehab at home.  He denies chest pain, palpitations, dyspnea, PND, orthopnea, nausea, vomiting, dizziness, syncope, edema, weight gain, or early satiety.  Device History: MDT CRTP implanted 2012 for complete heart block, CHF   Past Medical History:  Diagnosis Date  . Aortic valve replaced    s/p TAVR  . BPH (benign prostatic hyperplasia)   . Cardiomyopathy (HCC)   . Complete heart block (HCC)   . Diabetes mellitus   . Hypercholesteremia   . Hyperlipemia   . Hypertension   . Iron deficiency anemia   . Permanent atrial fibrillation [facility])    Past Surgical History:  Procedure Laterality Date  . PACEMAKER INSERTION  05/12/10   MDT Consult [facility] by Dr Chryl Heck  . TRANSCATHETER AORTIC VALVE REPLACEMENT, TRANSAORTIC  2012   [facility]    Current Outpatient Prescriptions  Medication Sig Dispense Refill  . carvedilol (COREG) 25 MG tablet Take 25 mg by mouth 2 (two) times daily with a meal.    . cetirizine (ZYRTEC) 10 MG tablet Take 10 mg by mouth daily.     . dorzolamide-timolol (COSOPT) 22.3-6.8 MG/ML ophthalmic solution Place 1 drop into both eyes 2 (two) times daily.    . ferrous sulfate 325 (65 FE) MG tablet Take 162.5 mg by mouth daily with breakfast.     . finasteride (PROSCAR) 5 MG tablet Take 5 mg by mouth daily.      . fluocinonide (LIDEX) 0.05 % ointment Apply 1 application topically daily.     . fluticasone (FLONASE) 50 MCG/ACT nasal spray Place 2 sprays into the nose as needed  for allergies.     Marland Kitchen latanoprost (XALATAN) 0.005 % ophthalmic solution Place 1 drop into both eyes at bedtime.      . mirtazapine (REMERON) 15 MG tablet Take 15 mg by mouth at bedtime.    . Tamsulosin HCl (FLOMAX) 0.4 MG CAPS Take 0.4 mg by mouth at bedtime.     . triamcinolone cream (KENALOG) 0.1 % Apply 1 application topically as needed (itching).    Marland Kitchen VITAMIN K PO Take 100 mcg by mouth daily.     Marland Kitchen warfarin (COUMADIN) 4 MG tablet Take 4 mg by mouth See admin instructions. Take 4 mg by mouth daily except on Friday.    . warfarin (COUMADIN) 5 MG tablet Take 5 mg by mouth every Friday.      No current facility-administered medications for this visit.     Allergies:   Patient has no known allergies.   Social History: Social History   Social History  . Marital status: Married    Spouse name: N/A  . Number of children: N/A  . Years of education: N/A   Occupational History  . Not on file.   Social History Main Topics  . Smoking status: Never Smoker  . Smokeless tobacco: Never Used  . Alcohol use No  . Drug use: No  . Sexual activity: Not on file   Other Topics Concern  . Not on file   Social History  Narrative   Retired from [employer]    Family History: Family History  Problem Relation Age of Onset  . Hypertension       Review of Systems: All other systems reviewed and are otherwise negative except as noted above.   Physical Exam: VS:  BP 126/62   Pulse 73   Ht 6' (1.829 m)   Wt 142 lb (64.4 kg)   BMI 19.26 kg/m  , BMI Body mass index is 19.26 kg/m.  GEN- The patient is elderly, frail and chronically ill appearing, alert and oriented x 3 today.   HEENT: normocephalic, atraumatic; sclera clear, conjunctiva pink; hearing intact; oropharynx clear; neck supple  Lungs- Clear to ausculation bilaterally, normal work of breathing.  No wheezes, rales, rhonchi Heart- Regular rate and rhythm (paced) GI- soft, non-tender, non-distended, bowel sounds present    Extremities- no clubbing, cyanosis, or edema  MS- no significant deformity or atrophy Skin- warm and dry, no rash or lesion; PPM pocket well healed Psych- euthymic mood, full affect Neuro- strength and sensation are intact  PPM Interrogation- reviewed in detail today,  See PACEART report  EKG:  EKG is ordered today. The ekg ordered today shows atrial fibrillation with ventricular pacing   Recent Labs: 12/11/2015: ALT 22 04/22/2016: BUN 23; Creatinine, Ser 1.02; Hemoglobin 9.7; Platelets 100; Potassium 4.7; Sodium 134   Wt Readings from Last 3 Encounters:  07/04/16 142 lb (64.4 kg)  07/13/15 154 lb 9.6 oz (70.1 kg)  05/05/15 145 lb 1.9 oz (65.8 kg)     Other studies Reviewed: Additional studies/ records that were reviewed today include: Dr Johney Frame and Dr Anne Fu' office notes  Assessment and Plan:  1.  Complete heart block  Normal PPM function See Pace Art report No changes today  2.  Permanent atrial fibrillation Continue warfarin for CHADS2VASC of 4 INR followed through [place]  3.  HTN Stable No change required today  4.  Valvular heart disease s/p TAVR Followed by Dr Anne Fu   Current medicines are reviewed at length with the patient today.   The patient does not have concerns regarding his medicines.  The following changes were made today:  none  Labs/ tests ordered today include: none Orders Placed This Encounter  Procedures  . CUP PACEART INCLINIC DEVICE CHECK  . EKG 12-Lead     Disposition:   Follow up with [facility], ICM clinic,  Dr Anne Fu as scheduled, Dr Johney Frame in 1 year    Signed, Gypsy Balsam, NP 07/04/2016 11:35 AM  [facility] [address] [place] [address] [phone] (office) [phone] (fax)

## 2016-07-03 DIAGNOSIS — R262 Difficulty in walking, not elsewhere classified: Secondary | ICD-10-CM | POA: Diagnosis not present

## 2016-07-03 DIAGNOSIS — S42021D Displaced fracture of shaft of right clavicle, subsequent encounter for fracture with routine healing: Secondary | ICD-10-CM | POA: Diagnosis not present

## 2016-07-03 DIAGNOSIS — E1140 Type 2 diabetes mellitus with diabetic neuropathy, unspecified: Secondary | ICD-10-CM | POA: Diagnosis not present

## 2016-07-03 DIAGNOSIS — I482 Chronic atrial fibrillation: Secondary | ICD-10-CM | POA: Diagnosis not present

## 2016-07-03 DIAGNOSIS — I130 Hypertensive heart and chronic kidney disease with heart failure and stage 1 through stage 4 chronic kidney disease, or unspecified chronic kidney disease: Secondary | ICD-10-CM | POA: Diagnosis not present

## 2016-07-03 DIAGNOSIS — S51801D Unspecified open wound of right forearm, subsequent encounter: Secondary | ICD-10-CM | POA: Diagnosis not present

## 2016-07-04 ENCOUNTER — Encounter: Payer: Self-pay | Admitting: Nurse Practitioner

## 2016-07-04 ENCOUNTER — Ambulatory Visit (INDEPENDENT_AMBULATORY_CARE_PROVIDER_SITE_OTHER): Payer: Medicare HMO | Admitting: Nurse Practitioner

## 2016-07-04 VITALS — BP 126/62 | HR 73 | Ht 72.0 in | Wt 142.0 lb

## 2016-07-04 DIAGNOSIS — I442 Atrioventricular block, complete: Secondary | ICD-10-CM

## 2016-07-04 DIAGNOSIS — I10 Essential (primary) hypertension: Secondary | ICD-10-CM

## 2016-07-04 DIAGNOSIS — I482 Chronic atrial fibrillation: Secondary | ICD-10-CM

## 2016-07-04 DIAGNOSIS — Z952 Presence of prosthetic heart valve: Secondary | ICD-10-CM | POA: Diagnosis not present

## 2016-07-04 DIAGNOSIS — I4821 Permanent atrial fibrillation: Secondary | ICD-10-CM

## 2016-07-04 DIAGNOSIS — I5022 Chronic systolic (congestive) heart failure: Secondary | ICD-10-CM

## 2016-07-04 LAB — CUP PACEART INCLINIC DEVICE CHECK
Date Time Interrogation Session: 20180308113421
Implantable Lead Implant Date: 20120113
Implantable Lead Implant Date: 20120113
Implantable Lead Location: 753858
Implantable Lead Model: 4196
Implantable Lead Model: 5076
Implantable Pulse Generator Implant Date: 20111019
MDC IDC LEAD IMPLANT DT: 20120113
MDC IDC LEAD LOCATION: 753859
MDC IDC LEAD LOCATION: 753860

## 2016-07-04 NOTE — Patient Instructions (Addendum)
Medication Instructions:    Your physician recommends that you continue on your current medications as directed. Please refer to the Current Medication list given to you today.   If you need a refill on your cardiac medications before your next appointment, please call your pharmacy.  Labwork: NONE ORDERED  TODAY    Testing/Procedures: NONE ORDERED  TODAY    Follow-Up:   Your physician wants you to follow-up in: ONE YEAR WITH  ALLRED  You will receive a reminder letter in the mail two months in advance. If you don't receive a letter, please call our office to schedule the follow-up appointment.  Remote monitoring is used to monitor your Pacemaker of ICD from home. This monitoring reduces the number of office visits required to check your device to one time per year. It allows [place] to keep an eye on the functioning of your device to ensure it is working properly. You are scheduled for a device check from home on .  10-07-2016..You may send your transmission at any time that day. If you have a wireless device, the transmission will be sent automatically. After your physician reviews your transmission, you will receive a postcard with your next transmission date.  YO HAVE BEEN REFERRED TO [facility]IN[facility] NURSE FOR MONTHLY CHECK INS.Marland Kitchen  Any Other Special Instructions Will Be Listed Below (If Applicable).

## 2016-07-05 DIAGNOSIS — E1140 Type 2 diabetes mellitus with diabetic neuropathy, unspecified: Secondary | ICD-10-CM | POA: Diagnosis not present

## 2016-07-05 DIAGNOSIS — S51801D Unspecified open wound of right forearm, subsequent encounter: Secondary | ICD-10-CM | POA: Diagnosis not present

## 2016-07-05 DIAGNOSIS — I482 Chronic atrial fibrillation: Secondary | ICD-10-CM | POA: Diagnosis not present

## 2016-07-05 DIAGNOSIS — R262 Difficulty in walking, not elsewhere classified: Secondary | ICD-10-CM | POA: Diagnosis not present

## 2016-07-05 DIAGNOSIS — S42021D Displaced fracture of shaft of right clavicle, subsequent encounter for fracture with routine healing: Secondary | ICD-10-CM | POA: Diagnosis not present

## 2016-07-05 DIAGNOSIS — I130 Hypertensive heart and chronic kidney disease with heart failure and stage 1 through stage 4 chronic kidney disease, or unspecified chronic kidney disease: Secondary | ICD-10-CM | POA: Diagnosis not present

## 2016-07-08 DIAGNOSIS — E1140 Type 2 diabetes mellitus with diabetic neuropathy, unspecified: Secondary | ICD-10-CM | POA: Diagnosis not present

## 2016-07-08 DIAGNOSIS — I130 Hypertensive heart and chronic kidney disease with heart failure and stage 1 through stage 4 chronic kidney disease, or unspecified chronic kidney disease: Secondary | ICD-10-CM | POA: Diagnosis not present

## 2016-07-08 DIAGNOSIS — S51801D Unspecified open wound of right forearm, subsequent encounter: Secondary | ICD-10-CM | POA: Diagnosis not present

## 2016-07-08 DIAGNOSIS — S42021D Displaced fracture of shaft of right clavicle, subsequent encounter for fracture with routine healing: Secondary | ICD-10-CM | POA: Diagnosis not present

## 2016-07-08 DIAGNOSIS — I482 Chronic atrial fibrillation: Secondary | ICD-10-CM | POA: Diagnosis not present

## 2016-07-08 DIAGNOSIS — R262 Difficulty in walking, not elsewhere classified: Secondary | ICD-10-CM | POA: Diagnosis not present

## 2016-07-09 DIAGNOSIS — I130 Hypertensive heart and chronic kidney disease with heart failure and stage 1 through stage 4 chronic kidney disease, or unspecified chronic kidney disease: Secondary | ICD-10-CM | POA: Diagnosis not present

## 2016-07-09 DIAGNOSIS — I482 Chronic atrial fibrillation: Secondary | ICD-10-CM | POA: Diagnosis not present

## 2016-07-09 DIAGNOSIS — R262 Difficulty in walking, not elsewhere classified: Secondary | ICD-10-CM | POA: Diagnosis not present

## 2016-07-09 DIAGNOSIS — S51801D Unspecified open wound of right forearm, subsequent encounter: Secondary | ICD-10-CM | POA: Diagnosis not present

## 2016-07-09 DIAGNOSIS — S42021D Displaced fracture of shaft of right clavicle, subsequent encounter for fracture with routine healing: Secondary | ICD-10-CM | POA: Diagnosis not present

## 2016-07-09 DIAGNOSIS — E1140 Type 2 diabetes mellitus with diabetic neuropathy, unspecified: Secondary | ICD-10-CM | POA: Diagnosis not present

## 2016-07-10 DIAGNOSIS — I130 Hypertensive heart and chronic kidney disease with heart failure and stage 1 through stage 4 chronic kidney disease, or unspecified chronic kidney disease: Secondary | ICD-10-CM | POA: Diagnosis not present

## 2016-07-10 DIAGNOSIS — E1140 Type 2 diabetes mellitus with diabetic neuropathy, unspecified: Secondary | ICD-10-CM | POA: Diagnosis not present

## 2016-07-10 DIAGNOSIS — R262 Difficulty in walking, not elsewhere classified: Secondary | ICD-10-CM | POA: Diagnosis not present

## 2016-07-10 DIAGNOSIS — I482 Chronic atrial fibrillation: Secondary | ICD-10-CM | POA: Diagnosis not present

## 2016-07-10 DIAGNOSIS — S51801D Unspecified open wound of right forearm, subsequent encounter: Secondary | ICD-10-CM | POA: Diagnosis not present

## 2016-07-10 DIAGNOSIS — S42021D Displaced fracture of shaft of right clavicle, subsequent encounter for fracture with routine healing: Secondary | ICD-10-CM | POA: Diagnosis not present

## 2016-07-11 DIAGNOSIS — I482 Chronic atrial fibrillation: Secondary | ICD-10-CM | POA: Diagnosis not present

## 2016-07-11 DIAGNOSIS — S51801D Unspecified open wound of right forearm, subsequent encounter: Secondary | ICD-10-CM | POA: Diagnosis not present

## 2016-07-11 DIAGNOSIS — E1140 Type 2 diabetes mellitus with diabetic neuropathy, unspecified: Secondary | ICD-10-CM | POA: Diagnosis not present

## 2016-07-11 DIAGNOSIS — R262 Difficulty in walking, not elsewhere classified: Secondary | ICD-10-CM | POA: Diagnosis not present

## 2016-07-11 DIAGNOSIS — I130 Hypertensive heart and chronic kidney disease with heart failure and stage 1 through stage 4 chronic kidney disease, or unspecified chronic kidney disease: Secondary | ICD-10-CM | POA: Diagnosis not present

## 2016-07-11 DIAGNOSIS — S42021D Displaced fracture of shaft of right clavicle, subsequent encounter for fracture with routine healing: Secondary | ICD-10-CM | POA: Diagnosis not present

## 2016-07-12 DIAGNOSIS — S51801D Unspecified open wound of right forearm, subsequent encounter: Secondary | ICD-10-CM | POA: Diagnosis not present

## 2016-07-12 DIAGNOSIS — R262 Difficulty in walking, not elsewhere classified: Secondary | ICD-10-CM | POA: Diagnosis not present

## 2016-07-12 DIAGNOSIS — S42021D Displaced fracture of shaft of right clavicle, subsequent encounter for fracture with routine healing: Secondary | ICD-10-CM | POA: Diagnosis not present

## 2016-07-12 DIAGNOSIS — I130 Hypertensive heart and chronic kidney disease with heart failure and stage 1 through stage 4 chronic kidney disease, or unspecified chronic kidney disease: Secondary | ICD-10-CM | POA: Diagnosis not present

## 2016-07-12 DIAGNOSIS — E1140 Type 2 diabetes mellitus with diabetic neuropathy, unspecified: Secondary | ICD-10-CM | POA: Diagnosis not present

## 2016-07-12 DIAGNOSIS — I482 Chronic atrial fibrillation: Secondary | ICD-10-CM | POA: Diagnosis not present

## 2016-07-15 ENCOUNTER — Telehealth: Payer: Self-pay

## 2016-07-15 DIAGNOSIS — I130 Hypertensive heart and chronic kidney disease with heart failure and stage 1 through stage 4 chronic kidney disease, or unspecified chronic kidney disease: Secondary | ICD-10-CM | POA: Diagnosis not present

## 2016-07-15 DIAGNOSIS — R262 Difficulty in walking, not elsewhere classified: Secondary | ICD-10-CM | POA: Diagnosis not present

## 2016-07-15 DIAGNOSIS — S42021D Displaced fracture of shaft of right clavicle, subsequent encounter for fracture with routine healing: Secondary | ICD-10-CM | POA: Diagnosis not present

## 2016-07-15 DIAGNOSIS — S51801D Unspecified open wound of right forearm, subsequent encounter: Secondary | ICD-10-CM | POA: Diagnosis not present

## 2016-07-15 DIAGNOSIS — E1140 Type 2 diabetes mellitus with diabetic neuropathy, unspecified: Secondary | ICD-10-CM | POA: Diagnosis not present

## 2016-07-15 DIAGNOSIS — I482 Chronic atrial fibrillation: Secondary | ICD-10-CM | POA: Diagnosis not present

## 2016-07-15 NOTE — Telephone Encounter (Signed)
Referred to ICM clinic by Gypsy Balsam, NP.  Call to patient and ICM intro given but he was unsure if he had a home monitor that checks his device.  He does reported he does not remember the office appointment with Amber.  Unable to enroll in [facility] clinic at this time but will check back with patient when daughter is available.

## 2016-07-17 DIAGNOSIS — S51801D Unspecified open wound of right forearm, subsequent encounter: Secondary | ICD-10-CM | POA: Diagnosis not present

## 2016-07-17 DIAGNOSIS — E1140 Type 2 diabetes mellitus with diabetic neuropathy, unspecified: Secondary | ICD-10-CM | POA: Diagnosis not present

## 2016-07-17 DIAGNOSIS — I482 Chronic atrial fibrillation: Secondary | ICD-10-CM | POA: Diagnosis not present

## 2016-07-17 DIAGNOSIS — I130 Hypertensive heart and chronic kidney disease with heart failure and stage 1 through stage 4 chronic kidney disease, or unspecified chronic kidney disease: Secondary | ICD-10-CM | POA: Diagnosis not present

## 2016-07-17 DIAGNOSIS — R262 Difficulty in walking, not elsewhere classified: Secondary | ICD-10-CM | POA: Diagnosis not present

## 2016-07-17 DIAGNOSIS — S42021D Displaced fracture of shaft of right clavicle, subsequent encounter for fracture with routine healing: Secondary | ICD-10-CM | POA: Diagnosis not present

## 2016-07-18 DIAGNOSIS — S42021D Displaced fracture of shaft of right clavicle, subsequent encounter for fracture with routine healing: Secondary | ICD-10-CM | POA: Diagnosis not present

## 2016-07-18 DIAGNOSIS — E1140 Type 2 diabetes mellitus with diabetic neuropathy, unspecified: Secondary | ICD-10-CM | POA: Diagnosis not present

## 2016-07-18 DIAGNOSIS — S51801D Unspecified open wound of right forearm, subsequent encounter: Secondary | ICD-10-CM | POA: Diagnosis not present

## 2016-07-18 DIAGNOSIS — I130 Hypertensive heart and chronic kidney disease with heart failure and stage 1 through stage 4 chronic kidney disease, or unspecified chronic kidney disease: Secondary | ICD-10-CM | POA: Diagnosis not present

## 2016-07-18 DIAGNOSIS — R262 Difficulty in walking, not elsewhere classified: Secondary | ICD-10-CM | POA: Diagnosis not present

## 2016-07-18 DIAGNOSIS — I482 Chronic atrial fibrillation: Secondary | ICD-10-CM | POA: Diagnosis not present

## 2016-07-19 DIAGNOSIS — E1140 Type 2 diabetes mellitus with diabetic neuropathy, unspecified: Secondary | ICD-10-CM | POA: Diagnosis not present

## 2016-07-19 DIAGNOSIS — I130 Hypertensive heart and chronic kidney disease with heart failure and stage 1 through stage 4 chronic kidney disease, or unspecified chronic kidney disease: Secondary | ICD-10-CM | POA: Diagnosis not present

## 2016-07-19 DIAGNOSIS — I482 Chronic atrial fibrillation: Secondary | ICD-10-CM | POA: Diagnosis not present

## 2016-07-19 DIAGNOSIS — S42021D Displaced fracture of shaft of right clavicle, subsequent encounter for fracture with routine healing: Secondary | ICD-10-CM | POA: Diagnosis not present

## 2016-07-19 DIAGNOSIS — R262 Difficulty in walking, not elsewhere classified: Secondary | ICD-10-CM | POA: Diagnosis not present

## 2016-07-19 DIAGNOSIS — S51801D Unspecified open wound of right forearm, subsequent encounter: Secondary | ICD-10-CM | POA: Diagnosis not present

## 2016-07-23 DIAGNOSIS — E1140 Type 2 diabetes mellitus with diabetic neuropathy, unspecified: Secondary | ICD-10-CM | POA: Diagnosis not present

## 2016-07-23 DIAGNOSIS — I130 Hypertensive heart and chronic kidney disease with heart failure and stage 1 through stage 4 chronic kidney disease, or unspecified chronic kidney disease: Secondary | ICD-10-CM | POA: Diagnosis not present

## 2016-07-23 DIAGNOSIS — S51801D Unspecified open wound of right forearm, subsequent encounter: Secondary | ICD-10-CM | POA: Diagnosis not present

## 2016-07-23 DIAGNOSIS — S42021D Displaced fracture of shaft of right clavicle, subsequent encounter for fracture with routine healing: Secondary | ICD-10-CM | POA: Diagnosis not present

## 2016-07-23 DIAGNOSIS — I482 Chronic atrial fibrillation: Secondary | ICD-10-CM | POA: Diagnosis not present

## 2016-07-23 DIAGNOSIS — R262 Difficulty in walking, not elsewhere classified: Secondary | ICD-10-CM | POA: Diagnosis not present

## 2016-07-25 ENCOUNTER — Encounter: Payer: Self-pay | Admitting: Cardiology

## 2016-07-25 DIAGNOSIS — S51801D Unspecified open wound of right forearm, subsequent encounter: Secondary | ICD-10-CM | POA: Diagnosis not present

## 2016-07-25 DIAGNOSIS — S42021D Displaced fracture of shaft of right clavicle, subsequent encounter for fracture with routine healing: Secondary | ICD-10-CM | POA: Diagnosis not present

## 2016-07-25 DIAGNOSIS — E1140 Type 2 diabetes mellitus with diabetic neuropathy, unspecified: Secondary | ICD-10-CM | POA: Diagnosis not present

## 2016-07-25 DIAGNOSIS — R262 Difficulty in walking, not elsewhere classified: Secondary | ICD-10-CM | POA: Diagnosis not present

## 2016-07-25 DIAGNOSIS — I130 Hypertensive heart and chronic kidney disease with heart failure and stage 1 through stage 4 chronic kidney disease, or unspecified chronic kidney disease: Secondary | ICD-10-CM | POA: Diagnosis not present

## 2016-07-25 DIAGNOSIS — I482 Chronic atrial fibrillation: Secondary | ICD-10-CM | POA: Diagnosis not present

## 2016-07-26 DIAGNOSIS — I482 Chronic atrial fibrillation: Secondary | ICD-10-CM | POA: Diagnosis not present

## 2016-07-26 DIAGNOSIS — I130 Hypertensive heart and chronic kidney disease with heart failure and stage 1 through stage 4 chronic kidney disease, or unspecified chronic kidney disease: Secondary | ICD-10-CM | POA: Diagnosis not present

## 2016-07-26 DIAGNOSIS — S51801D Unspecified open wound of right forearm, subsequent encounter: Secondary | ICD-10-CM | POA: Diagnosis not present

## 2016-07-26 DIAGNOSIS — R262 Difficulty in walking, not elsewhere classified: Secondary | ICD-10-CM | POA: Diagnosis not present

## 2016-07-26 DIAGNOSIS — S42021D Displaced fracture of shaft of right clavicle, subsequent encounter for fracture with routine healing: Secondary | ICD-10-CM | POA: Diagnosis not present

## 2016-07-26 DIAGNOSIS — E1140 Type 2 diabetes mellitus with diabetic neuropathy, unspecified: Secondary | ICD-10-CM | POA: Diagnosis not present

## 2016-07-30 DIAGNOSIS — I482 Chronic atrial fibrillation: Secondary | ICD-10-CM | POA: Diagnosis not present

## 2016-07-30 DIAGNOSIS — E1140 Type 2 diabetes mellitus with diabetic neuropathy, unspecified: Secondary | ICD-10-CM | POA: Diagnosis not present

## 2016-07-30 DIAGNOSIS — I130 Hypertensive heart and chronic kidney disease with heart failure and stage 1 through stage 4 chronic kidney disease, or unspecified chronic kidney disease: Secondary | ICD-10-CM | POA: Diagnosis not present

## 2016-07-30 DIAGNOSIS — S42021D Displaced fracture of shaft of right clavicle, subsequent encounter for fracture with routine healing: Secondary | ICD-10-CM | POA: Diagnosis not present

## 2016-07-30 DIAGNOSIS — R262 Difficulty in walking, not elsewhere classified: Secondary | ICD-10-CM | POA: Diagnosis not present

## 2016-07-30 DIAGNOSIS — S51801D Unspecified open wound of right forearm, subsequent encounter: Secondary | ICD-10-CM | POA: Diagnosis not present

## 2016-08-02 NOTE — Telephone Encounter (Signed)
ICM intro call to daughter, Marton [facility]) at [phone] and she agreed to monthly ICM follow up.  Patient has difficulty with memory.  He has a pacemaker and she will be responsible for sending the remote.  1st ICM remote transmission scheduled for 08/20/2016 and she will send by 3pm.  Provided direct ICM number and encouraged to call if patient has any fluid symptoms.

## 2016-08-06 DIAGNOSIS — I130 Hypertensive heart and chronic kidney disease with heart failure and stage 1 through stage 4 chronic kidney disease, or unspecified chronic kidney disease: Secondary | ICD-10-CM | POA: Diagnosis not present

## 2016-08-06 DIAGNOSIS — I482 Chronic atrial fibrillation: Secondary | ICD-10-CM | POA: Diagnosis not present

## 2016-08-06 DIAGNOSIS — S51801D Unspecified open wound of right forearm, subsequent encounter: Secondary | ICD-10-CM | POA: Diagnosis not present

## 2016-08-06 DIAGNOSIS — E1140 Type 2 diabetes mellitus with diabetic neuropathy, unspecified: Secondary | ICD-10-CM | POA: Diagnosis not present

## 2016-08-06 DIAGNOSIS — S42021D Displaced fracture of shaft of right clavicle, subsequent encounter for fracture with routine healing: Secondary | ICD-10-CM | POA: Diagnosis not present

## 2016-08-06 DIAGNOSIS — R262 Difficulty in walking, not elsewhere classified: Secondary | ICD-10-CM | POA: Diagnosis not present

## 2016-08-08 ENCOUNTER — Ambulatory Visit (INDEPENDENT_AMBULATORY_CARE_PROVIDER_SITE_OTHER): Payer: Medicare HMO | Admitting: Cardiology

## 2016-08-08 ENCOUNTER — Encounter: Payer: Self-pay | Admitting: Cardiology

## 2016-08-08 VITALS — BP 126/64 | HR 72 | Resp 16 | Ht 72.0 in | Wt 142.0 lb

## 2016-08-08 DIAGNOSIS — Z952 Presence of prosthetic heart valve: Secondary | ICD-10-CM

## 2016-08-08 DIAGNOSIS — Z951 Presence of aortocoronary bypass graft: Secondary | ICD-10-CM

## 2016-08-08 DIAGNOSIS — I442 Atrioventricular block, complete: Secondary | ICD-10-CM | POA: Diagnosis not present

## 2016-08-08 NOTE — Patient Instructions (Signed)

## 2016-08-08 NOTE — Progress Notes (Signed)
[address] [place], [place]  [address] Phone: [phone] Fax:  [phone]  Date:  08/08/2016   ID:  Joseph Watts, DOB 05/07/1926, MRN [id number]  PCP:  Alysia Penna, MD   History of Present Illness: Joseph Watts is a 83 y.o. male status post TAVR X 2 at [facility]. Ejection fraction 35%. ICD-Bi-V.  After second valve procedure his central aortic regurgitation jet has resolved. He still has some regurgitation perivalvular. Notes reviewed. Dr. Romeo Apple.   Pancytopenia followed by Dr. Shirline Frees.  Left lower extremity edema resolved. He is off of Lasix, discontinued by [facility]. This was likely secondary to chronic kidney disease. I encouraged use of compression hose.  At one point I was concerned about hypotension however his carvedilol is now back up to 6.25. He is doing well. His [facility] physician increased this.  01/01/13 - mild SOB increased. Weight stable. No CP. Good UOP tried a few days of Lasix. Helped.  04/15/13-currently doing very well. Walking to [employer] without much difficulty. Still short of breath however. No changes made.  06/30/14 -new dx of PAF on coumadin. Longest episode was 4 days. Mild SOB at times with ambulation however this is intermittent. [facility] following war[facility]in. INR 2.1 last check. No signs of bleeding. Chronic pancytopenia. Asked him to check with [facility] whether or not he needs to continue aspirin 81 mg. Weight loss. Discussed continuing with protein.  05/05/15-prior visit with Nada Boozer, NP, cardiac cachexia was hypothesized however most recent echocardiogram at [facility] in 03/2015 demonstrated an ejection fraction of 45%, reassuring with only mild perivalvular regurgitation of aortic TAVR x 2 valve.  Afib since 8/16. They're requesting pacemaker follow-up here.  08/08/16-since we last slight sure there, he fractured his clavicle. He's had some more weight loss. Shortness of breath with activity noted. Balance issues.    Wt  Readings from Last 3 Encounters:  08/08/16 142 lb (64.4 kg)  07/04/16 142 lb (64.4 kg)  07/13/15 154 lb 9.6 oz (70.1 kg)     Past Medical History:  Diagnosis Date  . Aortic valve replaced    s/p TAVR  . BPH (benign prostatic hyperplasia)   . Cardiomyopathy (HCC)   . Complete heart block (HCC)   . Diabetes mellitus   . Hypercholesteremia   . Hyperlipemia   . Hypertension   . Iron deficiency anemia   . Permanent atrial fibrillation [facility])     Past Surgical History:  Procedure Laterality Date  . PACEMAKER INSERTION  05/12/10   MDT Consult CRT-P implanted at [facility] by Dr Chryl Heck  . TRANSCATHETER AORTIC VALVE REPLACEMENT, TRANSAORTIC  2012   [facility]    Current Outpatient Prescriptions  Medication Sig Dispense Refill  . carvedilol (COREG) 25 MG tablet Take 25 mg by mouth 2 (two) times daily with a meal.    . cetirizine (ZYRTEC) 10 MG tablet Take 10 mg by mouth daily.     . dorzolamide-timolol (COSOPT) 22.3-6.8 MG/ML ophthalmic solution Place 1 drop into both eyes 2 (two) times daily.    . ferrous sulfate 325 (65 FE) MG tablet Take 162.5 mg by mouth daily with breakfast.     . finasteride (PROSCAR) 5 MG tablet Take 5 mg by mouth daily.      . fluocinonide (LIDEX) 0.05 % ointment Apply 1 application topically daily.     . fluticasone (FLONASE) 50 MCG/ACT nasal spray Place 2 sprays into the nose as needed for allergies.     Marland Kitchen latanoprost (  XALATAN) 0.005 % ophthalmic solution Place 1 drop into both eyes at bedtime.      . mirtazapine (REMERON) 15 MG tablet Take 15 mg by mouth at bedtime.    . Tamsulosin HCl (FLOMAX) 0.4 MG CAPS Take 0.4 mg by mouth at bedtime.     . triamcinolone cream (KENALOG) 0.1 % Apply 1 application topically as needed (itching).    Marland Kitchen VITAMIN K PO Take 100 mcg by mouth daily.     Marland Kitchen warfarin (COUMADIN) 4 MG tablet Take 4 mg by mouth See admin instructions. Take 4 mg by mouth daily except on Friday.    . warfarin (COUMADIN) 5 MG tablet Take 5 mg by mouth every Friday.       No current facility-administered medications for this visit.     Allergies:   No Known Allergies  Social History:  The patient  reports that he has never smoked. He has never used smokeless tobacco. He reports that he does not drink alcohol or use drugs.   ROS:  Please see the history of present illness.   No syncope, no bleeding, no orthopnea, no PND, no chest pain    PHYSICAL EXAM: VS:  BP 126/64   Pulse 72   Resp 16   Ht 6' (1.829 m)   Wt 142 lb (64.4 kg)   SpO2 98%   BMI 19.26 kg/m  GEN: Well nourished, well developed, in no acute distress  HEENT: normal  Neck: no JVD, carotid bruits, or masses Cardiac: RRR;  Soft diastolic murmur, no rubs, or gallops,no edema  Respiratory:  clear to auscultation bilaterally, normal work of breathing GI: soft, nontender, nondistended, + BS MS: no deformity or atrophy  Skin: warm and dry, no rash Neuro:  Alert and Oriented x 3, Strength and sensation are intact Psych: euthymic mood, full affect   EKG:  06/30/14-AV pacing, 70                                                                                                                                               Echocardiogram  12/16 at [facility]-EF 45%, mild aortic regurgitation, mild LVH  ASSESSMENT AND PLAN:  1. Chronic systolic heart failure-ejection fraction has improved with biventricular pacing. Doing well without any significant changes. No changes in shortness of breath. NYHA class II symptoms. EF currently approximately 45-50 %. This was at [facility].  We  will continue with carvedilol. Unable to up titrate do to hypotension previously. He is at goal therapy with carvedilol. Currently 25 twice a day. No longer utilizing furosemide. Chronic kidney disease noted. No ACE inhibitor because of chronic kidney disease and hypertension. 2. Cachexia-unexplained weight loss perhaps from cardiomyopathy although ejection fraction is quite reassuring. Continue to encourage protein. He is getting Glucerna from the [place]. He has been back on a few pounds. Unfortunately he did suffer a fall and fractured his clavicle which healed with sling. 3. Chronic kidney disease stage III-primary physician has been following.  4. Biventricular ICD- reestablish with Dr. Johney Frame and device clinic. Continue to follow-up with repeat here. 5. Aortic insufficiency-mild post TAVR, can hear on exam. No changes on exam today. Stable. 6. Atrial fibrillation permanent-since August 2016, histogram demonstrates permanent atrial fibrillation. He seems relatively unchanged from a symptom standpoint. He is not demonstrating any significant shortness of breath. No further atypical chest pain. Doing well, stable no changes in medications.  7. We will see back in 6 months.  Signed, Donato Schultz, MD [facility]  08/08/2016 11:02 AM

## 2016-08-13 DIAGNOSIS — R262 Difficulty in walking, not elsewhere classified: Secondary | ICD-10-CM | POA: Diagnosis not present

## 2016-08-13 DIAGNOSIS — S42021D Displaced fracture of shaft of right clavicle, subsequent encounter for fracture with routine healing: Secondary | ICD-10-CM | POA: Diagnosis not present

## 2016-08-13 DIAGNOSIS — I130 Hypertensive heart and chronic kidney disease with heart failure and stage 1 through stage 4 chronic kidney disease, or unspecified chronic kidney disease: Secondary | ICD-10-CM | POA: Diagnosis not present

## 2016-08-13 DIAGNOSIS — S51801D Unspecified open wound of right forearm, subsequent encounter: Secondary | ICD-10-CM | POA: Diagnosis not present

## 2016-08-13 DIAGNOSIS — E1140 Type 2 diabetes mellitus with diabetic neuropathy, unspecified: Secondary | ICD-10-CM | POA: Diagnosis not present

## 2016-08-13 DIAGNOSIS — I482 Chronic atrial fibrillation: Secondary | ICD-10-CM | POA: Diagnosis not present

## 2016-08-20 ENCOUNTER — Encounter: Payer: Self-pay | Admitting: Nurse Practitioner

## 2016-08-20 ENCOUNTER — Ambulatory Visit (INDEPENDENT_AMBULATORY_CARE_PROVIDER_SITE_OTHER): Payer: Medicare HMO

## 2016-08-20 DIAGNOSIS — I482 Chronic atrial fibrillation: Secondary | ICD-10-CM | POA: Diagnosis not present

## 2016-08-20 DIAGNOSIS — S42021D Displaced fracture of shaft of right clavicle, subsequent encounter for fracture with routine healing: Secondary | ICD-10-CM | POA: Diagnosis not present

## 2016-08-20 DIAGNOSIS — R262 Difficulty in walking, not elsewhere classified: Secondary | ICD-10-CM | POA: Diagnosis not present

## 2016-08-20 DIAGNOSIS — I130 Hypertensive heart and chronic kidney disease with heart failure and stage 1 through stage 4 chronic kidney disease, or unspecified chronic kidney disease: Secondary | ICD-10-CM | POA: Diagnosis not present

## 2016-08-20 DIAGNOSIS — Z950 Presence of cardiac pacemaker: Secondary | ICD-10-CM | POA: Diagnosis not present

## 2016-08-20 DIAGNOSIS — I5022 Chronic systolic (congestive) heart failure: Secondary | ICD-10-CM

## 2016-08-20 DIAGNOSIS — S51801D Unspecified open wound of right forearm, subsequent encounter: Secondary | ICD-10-CM | POA: Diagnosis not present

## 2016-08-20 DIAGNOSIS — E1140 Type 2 diabetes mellitus with diabetic neuropathy, unspecified: Secondary | ICD-10-CM | POA: Diagnosis not present

## 2016-08-20 NOTE — Progress Notes (Signed)
EPIC Encounter for ICM Monitoring  Patient Name: Lemichael Konig is a 83 y.o. male Date: 08/20/2016 Primary Care Physican: Alysia Penna, MD Primary Cardiologist: Anne Fu Electrophysiologist: Allred Dry Weight:  Does not weigh Bi-V Pacing:  99.6%       1st ICM encounter.  Spoke with daughter, Marton Redwood, [place].  Heart Failure questions reviewed, pt has chronic leg swelling in left leg and sock leaves an imprint.     Thoracic impedance abnormal suggesting fluid accumulation from 06/27/2016 but has returned to baseline today.   No diuretic  Recommendations: No changes. Discussed ways to limit salt intake to 2000 mg/day and fluid intake to < 2 liters/day.  Encouraged to call for fluid symptoms.  Provided ICM number  Follow-up plan: ICM clinic phone appointment on 09/24/2016.    Copy of ICM check sent to Dr Anne Fu and Dr Johney Frame for review and if any recommendations will call her back.   3 month ICM trend: 08/20/2016   1 Year ICM trend:      Karie Soda, RN 08/20/2016 2:07 PM

## 2016-08-20 NOTE — Telephone Encounter (Signed)
Spoke with patients daughter and confirmed remote transmission. She was appreciative of phone call.

## 2016-08-23 DIAGNOSIS — S51801D Unspecified open wound of right forearm, subsequent encounter: Secondary | ICD-10-CM | POA: Diagnosis not present

## 2016-08-23 DIAGNOSIS — I130 Hypertensive heart and chronic kidney disease with heart failure and stage 1 through stage 4 chronic kidney disease, or unspecified chronic kidney disease: Secondary | ICD-10-CM | POA: Diagnosis not present

## 2016-08-23 DIAGNOSIS — I482 Chronic atrial fibrillation: Secondary | ICD-10-CM | POA: Diagnosis not present

## 2016-08-23 DIAGNOSIS — E1140 Type 2 diabetes mellitus with diabetic neuropathy, unspecified: Secondary | ICD-10-CM | POA: Diagnosis not present

## 2016-08-23 DIAGNOSIS — S42021D Displaced fracture of shaft of right clavicle, subsequent encounter for fracture with routine healing: Secondary | ICD-10-CM | POA: Diagnosis not present

## 2016-08-23 DIAGNOSIS — R262 Difficulty in walking, not elsewhere classified: Secondary | ICD-10-CM | POA: Diagnosis not present

## 2016-08-27 DIAGNOSIS — R262 Difficulty in walking, not elsewhere classified: Secondary | ICD-10-CM | POA: Diagnosis not present

## 2016-08-27 DIAGNOSIS — I130 Hypertensive heart and chronic kidney disease with heart failure and stage 1 through stage 4 chronic kidney disease, or unspecified chronic kidney disease: Secondary | ICD-10-CM | POA: Diagnosis not present

## 2016-08-27 DIAGNOSIS — S42021D Displaced fracture of shaft of right clavicle, subsequent encounter for fracture with routine healing: Secondary | ICD-10-CM | POA: Diagnosis not present

## 2016-08-27 DIAGNOSIS — E1140 Type 2 diabetes mellitus with diabetic neuropathy, unspecified: Secondary | ICD-10-CM | POA: Diagnosis not present

## 2016-08-27 DIAGNOSIS — S51801D Unspecified open wound of right forearm, subsequent encounter: Secondary | ICD-10-CM | POA: Diagnosis not present

## 2016-08-27 DIAGNOSIS — I482 Chronic atrial fibrillation: Secondary | ICD-10-CM | POA: Diagnosis not present

## 2016-08-29 DIAGNOSIS — I10 Essential (primary) hypertension: Secondary | ICD-10-CM | POA: Diagnosis not present

## 2016-08-29 DIAGNOSIS — E119 Type 2 diabetes mellitus without complications: Secondary | ICD-10-CM | POA: Diagnosis not present

## 2016-09-05 DIAGNOSIS — I10 Essential (primary) hypertension: Secondary | ICD-10-CM | POA: Diagnosis not present

## 2016-09-05 DIAGNOSIS — I4891 Unspecified atrial fibrillation: Secondary | ICD-10-CM | POA: Diagnosis not present

## 2016-09-05 DIAGNOSIS — N400 Enlarged prostate without lower urinary tract symptoms: Secondary | ICD-10-CM | POA: Diagnosis not present

## 2016-09-05 DIAGNOSIS — I25728 Atherosclerosis of autologous artery coronary artery bypass graft(s) with other forms of angina pectoris: Secondary | ICD-10-CM | POA: Diagnosis not present

## 2016-09-05 DIAGNOSIS — D509 Iron deficiency anemia, unspecified: Secondary | ICD-10-CM | POA: Diagnosis not present

## 2016-09-05 DIAGNOSIS — E784 Other hyperlipidemia: Secondary | ICD-10-CM | POA: Diagnosis not present

## 2016-09-05 DIAGNOSIS — Z0000 Encounter for general adult medical examination without abnormal findings: Secondary | ICD-10-CM | POA: Diagnosis not present

## 2016-09-05 DIAGNOSIS — R740 Nonspecific elevation of levels of transaminase and lactic acid dehydrogenase [LDH]: Secondary | ICD-10-CM | POA: Diagnosis not present

## 2016-09-05 DIAGNOSIS — E119 Type 2 diabetes mellitus without complications: Secondary | ICD-10-CM | POA: Diagnosis not present

## 2016-09-05 DIAGNOSIS — D508 Other iron deficiency anemias: Secondary | ICD-10-CM | POA: Diagnosis not present

## 2016-09-24 ENCOUNTER — Ambulatory Visit (INDEPENDENT_AMBULATORY_CARE_PROVIDER_SITE_OTHER): Payer: Medicare HMO

## 2016-09-24 ENCOUNTER — Telehealth: Payer: Self-pay | Admitting: Cardiology

## 2016-09-24 ENCOUNTER — Encounter: Payer: Self-pay | Admitting: Nurse Practitioner

## 2016-09-24 DIAGNOSIS — I5022 Chronic systolic (congestive) heart failure: Secondary | ICD-10-CM

## 2016-09-24 DIAGNOSIS — Z950 Presence of cardiac pacemaker: Secondary | ICD-10-CM | POA: Diagnosis not present

## 2016-09-24 NOTE — Telephone Encounter (Signed)
Spoke with pt and reminded pt of remote transmission that is due today. Pt verbalized understanding.   

## 2016-09-26 NOTE — Progress Notes (Signed)
EPIC Encounter for ICM Monitoring  Patient Name: Joseph Watts is a 83 y.o. male Date: 09/26/2016 Primary Care Physican: Alysia Penna, MD Primary Cardiologist: [facility] Electrophysiologist: Allred Dry Weight:  Does not weigh Bi-V Pacing:  99.6%               Spoke with daughter, Marton Redwood, [place]  Heart Failure questions reviewed, pt asymptomatic.   Thoracic impedance normal   No diuretic  Recommendations: No changes.  Encouraged to call for fluid symptoms or use local ER for any urgent symptoms.  Follow-up plan: ICM clinic phone appointment on 11/05/2016.  Daughter is usually goes to patient's home on Tuesdays and prefers that day to send transmissions.  Copy of ICM check sent to device physician.   3 month ICM trend: 09/26/2016   1 Year ICM trend:      Karie Soda, RN 09/26/2016 4:56 PM

## 2016-11-05 ENCOUNTER — Telehealth: Payer: Self-pay | Admitting: Internal Medicine

## 2016-11-05 ENCOUNTER — Ambulatory Visit (INDEPENDENT_AMBULATORY_CARE_PROVIDER_SITE_OTHER): Payer: Medicare HMO | Admitting: *Deleted

## 2016-11-05 ENCOUNTER — Telehealth: Payer: Self-pay | Admitting: Cardiology

## 2016-11-05 DIAGNOSIS — Z950 Presence of cardiac pacemaker: Secondary | ICD-10-CM

## 2016-11-05 DIAGNOSIS — I442 Atrioventricular block, complete: Secondary | ICD-10-CM | POA: Diagnosis not present

## 2016-11-05 DIAGNOSIS — I5022 Chronic systolic (congestive) heart failure: Secondary | ICD-10-CM | POA: Diagnosis not present

## 2016-11-05 NOTE — Telephone Encounter (Signed)
Confirmed remote transmission w/ pt daughter.   

## 2016-11-05 NOTE — Progress Notes (Signed)
Remote pacemaker transmission.   

## 2016-11-07 LAB — CUP PACEART REMOTE DEVICE CHECK
Battery Voltage: 2.95 V
Brady Statistic AP VP Percent: 0 %
Brady Statistic AP VS Percent: 0 %
Brady Statistic AS VP Percent: 0 %
Date Time Interrogation Session: 20180710171325
Implantable Lead Implant Date: 20120113
Implantable Lead Implant Date: 20120113
Implantable Lead Location: 753858
Implantable Lead Location: 753859
Implantable Lead Model: 4196
Lead Channel Impedance Value: 266 Ohm
Lead Channel Impedance Value: 323 Ohm
Lead Channel Impedance Value: 399 Ohm
Lead Channel Impedance Value: 4047 Ohm
Lead Channel Impedance Value: 4047 Ohm
Lead Channel Impedance Value: 456 Ohm
Lead Channel Impedance Value: 475 Ohm
Lead Channel Impedance Value: 627 Ohm
Lead Channel Pacing Threshold Amplitude: 0.875 V
Lead Channel Pacing Threshold Pulse Width: 0.4 ms
Lead Channel Pacing Threshold Pulse Width: 0.4 ms
Lead Channel Sensing Intrinsic Amplitude: 3.75 mV
Lead Channel Setting Pacing Amplitude: 1.75 V
Lead Channel Setting Pacing Amplitude: 2 V
Lead Channel Setting Pacing Pulse Width: 0.4 ms
Lead Channel Setting Pacing Pulse Width: 0.4 ms
Lead Channel Setting Sensing Sensitivity: 0.9 mV
MDC IDC LEAD IMPLANT DT: 20120113
MDC IDC LEAD LOCATION: 753860
MDC IDC MSMT BATTERY REMAINING LONGEVITY: 37 mo
MDC IDC MSMT LEADCHNL LV IMPEDANCE VALUE: 4047 Ohm
MDC IDC MSMT LEADCHNL LV PACING THRESHOLD AMPLITUDE: 1.125 V
MDC IDC MSMT LEADCHNL RA SENSING INTR AMPL: 0.5 mV
MDC IDC MSMT LEADCHNL RA SENSING INTR AMPL: 1.125 mV
MDC IDC MSMT LEADCHNL RV PACING THRESHOLD AMPLITUDE: 0.625 V
MDC IDC MSMT LEADCHNL RV PACING THRESHOLD PULSEWIDTH: 0.4 ms
MDC IDC MSMT LEADCHNL RV SENSING INTR AMPL: 3.75 mV
MDC IDC PG IMPLANT DT: 20111019
MDC IDC STAT BRADY AS VS PERCENT: 0 %
MDC IDC STAT BRADY RA PERCENT PACED: 0 %
MDC IDC STAT BRADY RV PERCENT PACED: 99.52 %

## 2016-11-07 NOTE — Progress Notes (Signed)
EPIC Encounter for ICM Monitoring  Patient Name: Joseph Watts is a 83 y.o. male Date: 11/07/2016 Primary Care Physican: Alysia Penna, MD Primary Cardiologist: Anne Fu Electrophysiologist: Allred Dry Weight:Does not weigh Bi-V Pacing: 99.6%                                                     Spoke with daughter, Joseph Watts, [place].  Heart Failure questions reviewed, pt asymptomatic    Thoracic impedance normal but was abnormal suggesting fluid accumulation starting 10/22/2016.  No diuretic  Recommendations: No changes.  Advised to limit salt intake to 2000 mg/day and fluid intake to < 2 liters/day.  Encouraged to call for fluid symptoms.  Follow-up plan: ICM clinic phone appointment on 12/10/2016.    Copy of ICM check sent to device physician.   3 month ICM trend: 11/05/2016   1 Year ICM trend:      Karie Soda, RN 11/07/2016 3:31 PM

## 2016-11-12 ENCOUNTER — Encounter: Payer: Self-pay | Admitting: Cardiology

## 2016-11-14 ENCOUNTER — Telehealth: Payer: Self-pay

## 2016-11-14 DIAGNOSIS — I5022 Chronic systolic (congestive) heart failure: Secondary | ICD-10-CM

## 2016-11-14 MED ORDER — FUROSEMIDE 20 MG PO TABS: ORAL_TABLET | ORAL | 0 refills | Status: DC

## 2016-11-14 NOTE — Telephone Encounter (Signed)
Reviewed symptoms with Dr Graciela Husbands in the office due to Dr Anne Fu and Dr Johney Frame are out of the office today.  Call back to daughter and advised Dr Graciela Husbands ordered Furosemide 20 mg 1 tablet daily x 3 days only.  Advised if 20 mg tablet does not make him urinate on the first day, they she may increase to 40 mg for 2 days only.  Once he has completed 3 days of Furosemide then should only take when directed. She understands.  She asked script be sent to Karin Golden at [facility].  Advised if any problems to call back.  She stated patient has taken Lasix before but has been several years. Advised to wait until Monday, 11/18/2016, to send ICM remote transmission for review.

## 2016-11-14 NOTE — Telephone Encounter (Signed)
Returned daughter's call as requested by voice mail message. She reported patient's swelling in feet have increased and he has an increased shortness of breath when walking to the kitchen and has to take long break after getting there. He has meals on wheels at home and he did eat deli sub sandwich on Tuesday.  She is unable to send a remote transmission until later today.

## 2016-11-18 ENCOUNTER — Encounter: Payer: Self-pay | Admitting: Nurse Practitioner

## 2016-11-18 ENCOUNTER — Ambulatory Visit (INDEPENDENT_AMBULATORY_CARE_PROVIDER_SITE_OTHER): Payer: Self-pay

## 2016-11-18 DIAGNOSIS — Z950 Presence of cardiac pacemaker: Secondary | ICD-10-CM

## 2016-11-18 DIAGNOSIS — I5022 Chronic systolic (congestive) heart failure: Secondary | ICD-10-CM

## 2016-11-18 NOTE — Progress Notes (Signed)
EPIC Encounter for ICM Monitoring  Patient Name: Joseph Watts is a 83 y.o. male Date: 11/18/2016 Primary Care Physican: Alysia Penna, MD Primary Cardiologist: Anne Fu Electrophysiologist: Allred Dry Weight:Does not weigh Bi-V Pacing: 99.3%      Spoke with daughter, Joseph Watts, [place].  She stated the swelling in patients feet have improved after Furosemide but one remains a little swollen but it is always that way. Breathing is back to normal.  He tolerated the Furosemide without any problems.     Thoracic impedance returned to normal after taking 3 days of Furosemide.  Dr Graciela Husbands ordered only 3 days of Furosemide on 7/19 and should only be taken as directed after the 3 days.   Prescribed: Furosemide 20 mg 1 tablet only as directed.    Recommendations: No changes.  Encouraged to call for fluid symptoms.  Follow-up plan: ICM clinic phone appointment on 12/10/2016.    Copy of ICM check sent to primary cardiologist and device physician.   3 month ICM trend: 11/18/2016   1 Year ICM trend:      Karie Soda, RN 11/18/2016 1:38 PM

## 2016-12-10 ENCOUNTER — Telehealth: Payer: Self-pay

## 2016-12-10 ENCOUNTER — Ambulatory Visit (INDEPENDENT_AMBULATORY_CARE_PROVIDER_SITE_OTHER): Payer: Medicare HMO

## 2016-12-10 DIAGNOSIS — Z950 Presence of cardiac pacemaker: Secondary | ICD-10-CM | POA: Diagnosis not present

## 2016-12-10 DIAGNOSIS — I5022 Chronic systolic (congestive) heart failure: Secondary | ICD-10-CM | POA: Diagnosis not present

## 2016-12-10 NOTE — Telephone Encounter (Signed)
Call to daughter, Marton Redwood.  Requested to send a remote transmission and she will send in the next 1/2 hour.

## 2016-12-10 NOTE — Progress Notes (Signed)
EPIC Encounter for ICM Monitoring  Patient Name: Joseph Watts is a 83 y.o. male Date: 12/10/2016 Primary Care Physican: Alysia Penna, MD Primary Cardiologist: Anne Fu Electrophysiologist: Allred Dry Weight:Does not weigh Bi-V Pacing: 99.5%      Spoke with daughter Marton Redwood. Heart Failure questions reviewed, pt asymptomatic.   Thoracic impedance normal.  Prescribed dosage: Per Dr Graciela Husbands when ordered Furosemide 20 mg 1 tablet take only as directed.    Recommendations: No changes.   Encouraged to call for fluid symptoms.  Follow-up plan: ICM clinic phone appointment on 01/14/2017.    Copy of ICM check sent to device physician.   3 month ICM trend: 12/10/2016   1 Year ICM trend:      Karie Soda, RN 12/10/2016 1:55 PM

## 2016-12-27 ENCOUNTER — Encounter (HOSPITAL_COMMUNITY): Payer: Self-pay

## 2016-12-27 ENCOUNTER — Inpatient Hospital Stay (HOSPITAL_COMMUNITY)
Admission: EM | Admit: 2016-12-27 | Discharge: 2016-12-31 | DRG: 602 | Disposition: A | Discharge status: Home Health | Payer: Medicare HMO | Attending: Family Medicine | Admitting: Family Medicine

## 2016-12-27 ENCOUNTER — Emergency Department (HOSPITAL_COMMUNITY): Payer: Medicare HMO

## 2016-12-27 DIAGNOSIS — R350 Frequency of micturition: Secondary | ICD-10-CM | POA: Diagnosis present

## 2016-12-27 DIAGNOSIS — Z951 Presence of aortocoronary bypass graft: Secondary | ICD-10-CM

## 2016-12-27 DIAGNOSIS — I130 Hypertensive heart and chronic kidney disease with heart failure and stage 1 through stage 4 chronic kidney disease, or unspecified chronic kidney disease: Secondary | ICD-10-CM | POA: Diagnosis present

## 2016-12-27 DIAGNOSIS — W19XXXD Unspecified fall, subsequent encounter: Secondary | ICD-10-CM | POA: Diagnosis not present

## 2016-12-27 DIAGNOSIS — W19XXXA Unspecified fall, initial encounter: Secondary | ICD-10-CM | POA: Diagnosis not present

## 2016-12-27 DIAGNOSIS — D649 Anemia, unspecified: Secondary | ICD-10-CM | POA: Diagnosis not present

## 2016-12-27 DIAGNOSIS — R531 Weakness: Secondary | ICD-10-CM | POA: Diagnosis present

## 2016-12-27 DIAGNOSIS — R5381 Other malaise: Secondary | ICD-10-CM | POA: Diagnosis present

## 2016-12-27 DIAGNOSIS — I10 Essential (primary) hypertension: Secondary | ICD-10-CM | POA: Diagnosis not present

## 2016-12-27 DIAGNOSIS — Z7901 Long term (current) use of anticoagulants: Secondary | ICD-10-CM

## 2016-12-27 DIAGNOSIS — M79641 Pain in right hand: Secondary | ICD-10-CM | POA: Diagnosis not present

## 2016-12-27 DIAGNOSIS — L03113 Cellulitis of right upper limb: Principal | ICD-10-CM | POA: Diagnosis present

## 2016-12-27 DIAGNOSIS — S0990XA Unspecified injury of head, initial encounter: Secondary | ICD-10-CM | POA: Diagnosis not present

## 2016-12-27 DIAGNOSIS — D631 Anemia in chronic kidney disease: Secondary | ICD-10-CM | POA: Diagnosis present

## 2016-12-27 DIAGNOSIS — E1122 Type 2 diabetes mellitus with diabetic chronic kidney disease: Secondary | ICD-10-CM | POA: Diagnosis present

## 2016-12-27 DIAGNOSIS — Z79899 Other long term (current) drug therapy: Secondary | ICD-10-CM

## 2016-12-27 DIAGNOSIS — M7989 Other specified soft tissue disorders: Secondary | ICD-10-CM | POA: Diagnosis not present

## 2016-12-27 DIAGNOSIS — S0181XA Laceration without foreign body of other part of head, initial encounter: Secondary | ICD-10-CM | POA: Diagnosis not present

## 2016-12-27 DIAGNOSIS — H409 Unspecified glaucoma: Secondary | ICD-10-CM | POA: Diagnosis present

## 2016-12-27 DIAGNOSIS — R9431 Abnormal electrocardiogram [ECG] [EKG]: Secondary | ICD-10-CM | POA: Diagnosis not present

## 2016-12-27 DIAGNOSIS — M8580 Other specified disorders of bone density and structure, unspecified site: Secondary | ICD-10-CM | POA: Diagnosis present

## 2016-12-27 DIAGNOSIS — I5022 Chronic systolic (congestive) heart failure: Secondary | ICD-10-CM | POA: Diagnosis not present

## 2016-12-27 DIAGNOSIS — E7800 Pure hypercholesterolemia, unspecified: Secondary | ICD-10-CM | POA: Diagnosis not present

## 2016-12-27 DIAGNOSIS — D61818 Other pancytopenia: Secondary | ICD-10-CM | POA: Diagnosis not present

## 2016-12-27 DIAGNOSIS — I482 Chronic atrial fibrillation: Secondary | ICD-10-CM | POA: Diagnosis not present

## 2016-12-27 DIAGNOSIS — G92 Toxic encephalopathy: Secondary | ICD-10-CM | POA: Diagnosis present

## 2016-12-27 DIAGNOSIS — I4821 Permanent atrial fibrillation: Secondary | ICD-10-CM | POA: Diagnosis present

## 2016-12-27 DIAGNOSIS — N183 Chronic kidney disease, stage 3 (moderate): Secondary | ICD-10-CM | POA: Diagnosis not present

## 2016-12-27 DIAGNOSIS — Y92009 Unspecified place in unspecified non-institutional (private) residence as the place of occurrence of the external cause: Secondary | ICD-10-CM | POA: Diagnosis not present

## 2016-12-27 DIAGNOSIS — Z950 Presence of cardiac pacemaker: Secondary | ICD-10-CM

## 2016-12-27 DIAGNOSIS — N401 Enlarged prostate with lower urinary tract symptoms: Secondary | ICD-10-CM | POA: Diagnosis present

## 2016-12-27 DIAGNOSIS — L0390 Cellulitis, unspecified: Secondary | ICD-10-CM | POA: Diagnosis present

## 2016-12-27 DIAGNOSIS — Z952 Presence of prosthetic heart valve: Secondary | ICD-10-CM | POA: Diagnosis not present

## 2016-12-27 DIAGNOSIS — S299XXA Unspecified injury of thorax, initial encounter: Secondary | ICD-10-CM | POA: Diagnosis not present

## 2016-12-27 LAB — COMPREHENSIVE METABOLIC PANEL
ALK PHOS: 104 U/L (ref 38–126)
ALT: 29 U/L (ref 17–63)
ANION GAP: 8 (ref 5–15)
AST: 50 U/L — ABNORMAL HIGH (ref 15–41)
Albumin: 3.3 g/dL — ABNORMAL LOW (ref 3.5–5.0)
BILIRUBIN TOTAL: 0.9 mg/dL (ref 0.3–1.2)
BUN: 28 mg/dL — AB (ref 6–20)
CO2: 20 mmol/L — ABNORMAL LOW (ref 22–32)
Calcium: 8.6 mg/dL — ABNORMAL LOW (ref 8.9–10.3)
Chloride: 107 mmol/L (ref 101–111)
Creatinine, Ser: 1.28 mg/dL — ABNORMAL HIGH (ref 0.61–1.24)
GFR calc Af Amer: 56 mL/min — ABNORMAL LOW (ref >60)
GFR, EST NON AFRICAN AMERICAN: 48 mL/min — AB (ref >60)
Glucose, Bld: 104 mg/dL — ABNORMAL HIGH (ref 65–99)
POTASSIUM: 5.1 mmol/L (ref 3.5–5.1)
Sodium: 135 mmol/L (ref 135–145)
TOTAL PROTEIN: 6.4 g/dL — AB (ref 6.5–8.1)

## 2016-12-27 LAB — CBC WITH DIFFERENTIAL/PLATELET
Basophils Absolute: 0 10*3/uL (ref 0.0–0.1)
Basophils Relative: 0 %
EOS ABS: 0.1 10*3/uL (ref 0.0–0.7)
EOS PCT: 1 %
HCT: 26 % — ABNORMAL LOW (ref 39.0–52.0)
HEMOGLOBIN: 8.2 g/dL — AB (ref 13.0–17.0)
LYMPHS PCT: 13 %
Lymphs Abs: 1.1 10*3/uL (ref 0.7–4.0)
MCH: 32.2 pg (ref 26.0–34.0)
MCHC: 31.5 g/dL (ref 30.0–36.0)
MCV: 102 fL — ABNORMAL HIGH (ref 78.0–100.0)
MONOS PCT: 11 %
Monocytes Absolute: 0.9 10*3/uL (ref 0.1–1.0)
NEUTROS PCT: 75 %
Neutro Abs: 6.4 10*3/uL (ref 1.7–7.7)
PLATELETS: 100 10*3/uL — AB (ref 150–400)
RBC: 2.55 MIL/uL — ABNORMAL LOW (ref 4.22–5.81)
RDW: 13.3 % (ref 11.5–15.5)
WBC: 8.5 10*3/uL (ref 4.0–10.5)

## 2016-12-27 LAB — URINALYSIS, ROUTINE W REFLEX MICROSCOPIC
BILIRUBIN URINE: NEGATIVE
Glucose, UA: NEGATIVE mg/dL
Hgb urine dipstick: NEGATIVE
KETONES UR: NEGATIVE mg/dL
LEUKOCYTES UA: NEGATIVE
NITRITE: NEGATIVE
Protein, ur: NEGATIVE mg/dL
Specific Gravity, Urine: 1.015 (ref 1.005–1.030)
pH: 6 (ref 5.0–8.0)

## 2016-12-27 LAB — I-STAT CG4 LACTIC ACID, ED: Lactic Acid, Venous: 1.65 mmol/L (ref 0.5–1.9)

## 2016-12-27 LAB — PROTIME-INR
INR: 2.03
PROTHROMBIN TIME: 22.8 s — AB (ref 11.4–15.2)

## 2016-12-27 MED ORDER — VANCOMYCIN HCL IN DEXTROSE 1-5 GM/200ML-% IV SOLN
1000.0000 mg | INTRAVENOUS | Status: DC
  Administered 2016-12-28 – 2016-12-29 (×2): 1000 mg via INTRAVENOUS
  Filled 2016-12-27 (×2): qty 200

## 2016-12-27 MED ORDER — VANCOMYCIN HCL IN DEXTROSE 1-5 GM/200ML-% IV SOLN
1000.0000 mg | Freq: Once | INTRAVENOUS | Status: AC
Start: 2016-12-27 — End: 2016-12-28
  Administered 2016-12-27: 1000 mg via INTRAVENOUS
  Filled 2016-12-27: qty 200

## 2016-12-27 NOTE — Consult Note (Signed)
Reason for Consult: Right hand cellulitis rule out deep infection Referring Physician: ER staff  Joseph Watts is an 83 y.o. male.  HPI: 83 year old male with history of diabetes who presents with a 24-hour history of cellulitic change in his right hand and wrist region.  There is a question of abnormality on x-ray and I was asked to see him.  The patient has no history of trauma fall or inciting event that he is aware of.  He has had diabetic foot issues and osteomyelitis in this region.  With his family member and with Dr. Alvino Chapel in the ER.I have reviewed the patient's current medications.  Past Medical History:  Diagnosis Date  . Aortic valve replaced    s/p TAVR  . BPH (benign prostatic hyperplasia)   . Cardiomyopathy ([place])   . Complete heart block ([place])   . Diabetes mellitus   . Hypercholesteremia   . Hyperlipemia   . Hypertension   . Iron deficiency anemia   . Permanent atrial fibrillation [facility])     Past Surgical History:  Procedure Laterality Date  . PACEMAKER INSERTION  05/12/10   MDT Consult CRT-P implanted at [facility] by Dr Brynda Rim  . TRANSCATHETER AORTIC VALVE REPLACEMENT, TRANSAORTIC  2012   [facility]    Family History  Problem Relation Age of Onset  . Hypertension Unknown     Social History:  reports that he has never smoked. He has never used smokeless tobacco. He reports that he does not drink alcohol or use drugs.  Allergies:  Allergies  Allergen Reactions  . Motrin [Ibuprofen] Other (See Comments)    Instructed by MD not to take due to Warfarin     Medications: I have reviewed the patient's current medications.  Results for orders placed or performed during the hospital encounter of 12/27/16 (from the past 48 hour(s))  Comprehensive metabolic panel     Status: Abnormal   Collection Time: 12/27/16  7:04 PM  Result Value Ref Range   Sodium 135 135 - 145 mmol/L   Potassium 5.1 3.5 - 5.1 mmol/L   Chloride 107 101 - 111 mmol/L   CO2 20 (L) 22 - 32  mmol/L   Glucose, Bld 104 (H) 65 - 99 mg/dL   BUN 28 (H) 6 - 20 mg/dL   Creatinine, Ser 1.28 (H) 0.61 - 1.24 mg/dL   Calcium 8.6 (L) 8.9 - 10.3 mg/dL   Total Protein 6.4 (L) 6.5 - 8.1 g/dL   Albumin 3.3 (L) 3.5 - 5.0 g/dL   AST 50 (H) 15 - 41 U/L   ALT 29 17 - 63 U/L   Alkaline Phosphatase 104 38 - 126 U/L   Total Bilirubin 0.9 0.3 - 1.2 mg/dL   GFR calc non Af Amer 48 (L) >60 mL/min   GFR calc Af Amer 56 (L) >60 mL/min    Comment: (NOTE) The eGFR has been calculated using the CKD EPI equation. This calculation has not been validated in all clinical situations. eGFR's persistently <60 mL/min signify possible Chronic Kidney Disease.    Anion gap 8 5 - 15  CBC with Differential     Status: Abnormal   Collection Time: 12/27/16  7:04 PM  Result Value Ref Range   WBC 8.5 4.0 - 10.5 K/uL   RBC 2.55 (L) 4.22 - 5.81 MIL/uL   Hemoglobin 8.2 (L) 13.0 - 17.0 g/dL   HCT 26.0 (L) 39.0 - 52.0 %   MCV 102.0 (H) 78.0 - 100.0 fL   MCH 32.2 26.0 -  34.0 pg   MCHC 31.5 30.0 - 36.0 g/dL   RDW 13.3 11.5 - 15.5 %   Platelets 100 (L) 150 - 400 K/uL    Comment: REPEATED TO VERIFY PLATELET COUNT CONFIRMED BY SMEAR    Neutrophils Relative % 75 %   Lymphocytes Relative 13 %   Monocytes Relative 11 %   Eosinophils Relative 1 %   Basophils Relative 0 %   Neutro Abs 6.4 1.7 - 7.7 K/uL   Lymphs Abs 1.1 0.7 - 4.0 K/uL   Monocytes Absolute 0.9 0.1 - 1.0 K/uL   Eosinophils Absolute 0.1 0.0 - 0.7 K/uL   Basophils Absolute 0.0 0.0 - 0.1 K/uL   RBC Morphology TEARDROP CELLS     Comment: ELLIPTOCYTES   WBC Morphology MILD LEFT SHIFT (1-5% METAS, OCC MYELO, OCC BANDS)   Protime-INR     Status: Abnormal   Collection Time: 12/27/16  7:04 PM  Result Value Ref Range   Prothrombin Time 22.8 (H) 11.4 - 15.2 seconds   INR 2.03   I-Stat CG4 Lactic Acid, ED     Status: None   Collection Time: 12/27/16  7:30 PM  Result Value Ref Range   Lactic Acid, Venous 1.65 0.5 - 1.9 mmol/L  Urinalysis, Routine w reflex  microscopic     Status: None   Collection Time: 12/27/16 10:55 PM  Result Value Ref Range   Color, Urine YELLOW YELLOW   APPearance CLEAR CLEAR   Specific Gravity, Urine 1.015 1.005 - 1.030   pH 6.0 5.0 - 8.0   Glucose, UA NEGATIVE NEGATIVE mg/dL   Hgb urine dipstick NEGATIVE NEGATIVE   Bilirubin Urine NEGATIVE NEGATIVE   Ketones, ur NEGATIVE NEGATIVE mg/dL   Protein, ur NEGATIVE NEGATIVE mg/dL   Nitrite NEGATIVE NEGATIVE   Leukocytes, UA NEGATIVE NEGATIVE    Dg Chest 2 View  Result Date: 12/27/2016 CLINICAL DATA:  [place] and hit head EXAM: CHEST  2 VIEW COMPARISON:  10/30/2012 FINDINGS: Post sternotomy changes and valvular prosthesis. Right-sided pacing device, similar compared to prior radiograph. Small bilateral pleural effusions. Hazy left basilar atelectasis or infiltrate. Mild cardiomegaly. Aortic atherosclerosis. Negative for pneumothorax. IMPRESSION: 1. Small pleural effusions. Hazy basilar atelectasis or infiltrate on the left 2. Mild cardiomegaly Electronically Signed   By: Donavan Foil M.D.   On: 12/27/2016 20:20   Dg Wrist Complete Right  Result Date: 12/27/2016 CLINICAL DATA:  Right arm swelling with redness EXAM: RIGHT WRIST - COMPLETE 3+ VIEW COMPARISON:  None. FINDINGS: Diffuse osteopenia. Widening of the scaffold lunate interval up to 6 mm. Small erosive change at the base of the fifth metacarpal and ulnar aspect of the fifth CMC joint. Lucency at the hamate bone. Prominent fibrocartilage calcification. Diffuse soft tissue swelling. Vascular calcification. No soft tissue gas. IMPRESSION: 1. Osteopenia without acute fracture 2. Soft tissue swelling. Suspicion of erosive changes at the base of the fifth metacarpal and ulnar aspect of the hamate bone, which may be secondary to osteomyelitis/infection in the appropriate clinical setting. 3. Chondrocalcinosis 4. Widened scaffold lunate interval, consistent with ligamentous abnormality. 5. Extensive vascular calcifications.  Electronically Signed   By: Donavan Foil M.D.   On: 12/27/2016 20:17   Ct Head Wo Contrast  Result Date: 12/27/2016 CLINICAL DATA:  83 year old male with injury following fall today. Initial encounter. EXAM: CT HEAD WITHOUT CONTRAST TECHNIQUE: Contiguous axial images were obtained from the base of the skull through the vertex without intravenous contrast. COMPARISON:  04/22/2016 and prior CTs FINDINGS: Brain: No evidence of  acute infarction, hemorrhage, hydrocephalus, extra-axial collection or mass lesion/mass effect. Atrophy and chronic small-vessel white matter ischemic changes are again noted. Vascular: Intracranial atherosclerotic calcifications noted. Skull: Normal. Negative for fracture or focal lesion. Sinuses/Orbits: No acute finding. Other: None IMPRESSION: No evidence of acute intracranial abnormality. Atrophy and chronic small-vessel white matter ischemic changes. Electronically Signed   By: Margarette Canada M.D.   On: 12/27/2016 19:58   Dg Hand Complete Right  Result Date: 12/27/2016 CLINICAL DATA:  Painful, swollen, red EXAM: RIGHT HAND - COMPLETE 3+ VIEW COMPARISON:  None. FINDINGS: Osteopenia. No fracture or malalignment. Mild degenerative changes at the DIP and PIP joints. Erosive change at the base of fifth metacarpal and CMC joint. IMPRESSION: 1. No fracture or malalignment 2. Erosive change at the base of fifth metacarpal/CMC joint raises suspicion for infection. Electronically Signed   By: Donavan Foil M.D.   On: 12/27/2016 20:19    Review of Systems  Respiratory: Negative.   Cardiovascular: Negative.   Gastrointestinal: Negative.   Genitourinary: Negative.    Blood pressure 140/66, pulse 70, temperature (!) 101 F (38.3 C), temperature source Oral, resp. rate (!) 23, SpO2 97 %. Physical Exam Cellulitic hand and wrist no evidence of pain on passive flexion and extension no evidence of instability no evidence of dystrophy.  Patient and I discussed all issues at length. He is  here today with his family.  He flexes and extends the finger wrist forearm and elbow without great difficulty. There is no fluctuance. Thenar hyperthenar and mid palm space are nontender.  No compartment syndrome no evidence of dystrophic reaction. He is warm and cellulitic on the right side left side is normal.  Lower extremity examination is fairly stable at present time. He is appropriate alert and has equal chest expansion.    Assessment/Plan: Cellulitis about the hand. I do not feel that he represents osteomyelitis or a septic joint. He is nontender with passive range of motion and the x-rays are significant for age-related degenerative change more so than any acute infectious osteomyelitic process. I discussed these issues with patient and his family as well as the ER staff.  I'll be happy to follow along as a Optometrist. He'll be admitted by medicine for IV antibiotics for cellulitis.  If things change I'll be happy to change direction in his treatment.  Paulene Floor 12/27/2016, 11:19 PM

## 2016-12-27 NOTE — Progress Notes (Addendum)
Pharmacy Antibiotic Note  Joseph Watts is a 83 y.o. male admitted on 12/27/2016 with cellulitis.  Pharmacy has been consulted for Vancomycin/Zosyn dosing. WBC WNL. Mild bump in Scr. Cellulitis in right forearm and hand.   Plan: -Vancomycin 1000 mg IV q24h -Zosyn 3.375G IV q8h to be infused over 4 hours -Trend WBC, temp, renal function  -Drug levels as indicated   Temp (24hrs), Avg:101 F (38.3 C), Min:101 F (38.3 C), Max:101 F (38.3 C)   Recent Labs Lab 12/27/16 1904 12/27/16 1930  WBC 8.5  --   CREATININE 1.28*  --   LATICACIDVEN  --  1.65    CrCl cannot be calculated (Unknown ideal weight.).    Allergies  Allergen Reactions  . Motrin [Ibuprofen] Other (See Comments)    Instructed by MD not to take due to Warfarin      Joseph, Watts 12/27/2016 11:21 PM

## 2016-12-27 NOTE — ED Triage Notes (Signed)
Per pt's wife pt fell and hit his head about 5:30 . Pt is on warfarin. Pt has been in bed all day and not feeling well according to wife. Pt has not been himself for a few days according to pt's daughter.nausea, vomiting, diarrhea. 152/70, 96% ra,  103.0 temp, rr 22. Pt's family also says his right arm has been red and hot to the touch. Oriented to place and himself but not to time and date.
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
## 2016-12-27 NOTE — ED Notes (Addendum)
Applied bacitracin to pt right forearm with rolled gauze per MD Gramig ortho applied splint

## 2016-12-27 NOTE — ED Provider Notes (Signed)
[facility]Y DEPT Provider Note   CSN: [id number] Arrival date & time: 12/27/16  1850     History   Chief Complaint Chief Complaint  Patient presents with  . Fall   Level V caveat due to mental status changes. HPI Joseph Watts is a 83 y.o. male.  HPI Patient presents with confusion. Has been confused for the last couple days. Has had some nausea vomiting and some diarrhea. Has had fevers of 103. Fever 101 here. Was confused had a fall striking his head. Is on Coumadin. Has had redness and swelling of his right hand and wrist. No cough. Has had some urinary frequency however. Past Medical History:  Diagnosis Date  . Aortic valve replaced    s/p TAVR  . BPH (benign prostatic hyperplasia)   . Cardiomyopathy (HCC)   . Complete heart block (HCC)   . Diabetes mellitus   . Hypercholesteremia   . Hyperlipemia   . Hypertension   . Iron deficiency anemia   . Permanent atrial fibrillation [facility])     Patient Active Problem List   Diagnosis Date Noted  . Complete heart block (HCC) 07/13/2015  . Permanent atrial fibrillation (HCC) 07/13/2015  . Essential hypertension 07/13/2015  . Anemia of other chronic disease 12/01/2012  . Hypercholesteremia   . Aortic valve replaced   . Chronic systolic heart failure (HCC) 08/26/2010  . Aortic insufficiency 08/26/2010  . Diabetes mellitus 08/26/2010  . CKD (chronic kidney disease) 08/26/2010  . Glaucoma 08/26/2010  . Status post biventricular cardiac pacemaker procedure 08/26/2010  . Hx of CABG 08/26/2010    Past Surgical History:  Procedure Laterality Date  . PACEMAKER INSERTION  05/12/10   MDT Consult CRT-P implanted at [facility] by Dr Chryl Heck  . TRANSCATHETER AORTIC VALVE REPLACEMENT, TRANSAORTIC  2012   [facility]       Home Medications    Prior to Admission medications   Medication Sig Start Date End Date Taking? Authorizing Provider  carvedilol (COREG) 25 MG tablet Take 25 mg by mouth 2 (two) times daily with a meal.   Yes  [provider]  cetirizine (ZYRTEC) 10 MG tablet Take 10 mg by mouth daily.    Yes [provider]  dorzolamide-timolol (COSOPT) 22.3-6.8 MG/ML ophthalmic solution Place 1 drop into both eyes 2 (two) times daily.   Yes [provider]  ferrous sulfate 325 (65 FE) MG tablet Take 162.5 mg by mouth daily with breakfast.  09/03/11  Yes Si Gaul, MD  finasteride (PROSCAR) 5 MG tablet Take 5 mg by mouth daily.     Yes [provider]  fluocinonide (LIDEX) 0.05 % ointment Apply 1 application topically daily as needed (skin care).    Yes [provider]  fluticasone (FLONASE) 50 MCG/ACT nasal spray Place 2 sprays into the nose as needed for allergies.    Yes [provider]  furosemide (LASIX) 20 MG tablet Take 1 tablet (20 mg) daily x 3 days and then only as directed. Patient taking differently: Take 20 mg by mouth daily as needed for fluid.  11/14/16  Yes [facility] Salvia, MD  latanoprost (XALATAN) 0.005 % ophthalmic solution Place 1 drop into both eyes at bedtime.     Yes [provider]  mirtazapine (REMERON) 15 MG tablet Take 15 mg by mouth at bedtime.   Yes [provider]  Tamsulosin HCl (FLOMAX) 0.4 MG CAPS Take 0.4 mg by mouth at bedtime.    Yes [provider]  triamcinolone cream (KENALOG) 0.1 %  Apply 1 application topically as needed (itching).   Yes [provider]  VITAMIN K PO Take 100 mcg by mouth daily.    Yes [provider]  warfarin (COUMADIN) 4 MG tablet Take 3-4 mg by mouth every morning. Tuesday, thursday Saturday 4 mg. All other days 3 mg   Yes [provider]    Family History Family History  Problem Relation Age of Onset  . Hypertension Unknown     Social History Social History  Substance Use Topics  . Smoking status: Never Smoker  . Smokeless tobacco: Never Used  . Alcohol use No     Allergies   Motrin [ibuprofen]   Review of Systems Review of  Systems  Unable to perform ROS: Mental status change  Constitutional: Negative for fatigue.  Respiratory: Negative for cough.   Cardiovascular: Negative for chest pain.  Musculoskeletal: Negative for back pain.  Skin: Positive for color change.     Physical Exam Updated Vital Signs BP (!) 116/43   Pulse 70   Temp (!) 101 F (38.3 C) (Oral)   Resp (!) 23   SpO2 96%   Physical Exam  Constitutional: He appears well-developed.  HENT:  Abrasion to occipital area.  Eyes: EOM are normal.  Irregular pupil on right side with left pupil somewhat constricted.  Neck: Neck supple.  Painless range of motion  Cardiovascular: Normal rate.   Pulmonary/Chest: Effort normal.  Abdominal: Soft. There is no tenderness.  Musculoskeletal:  Erythema with swelling to left hand and forearm. Good range of motion in the wrist. Neurovascular intact. Some pain with movement of the fingers.  Neurological: He is alert.  Awake and pleasant but somewhat confused.  Skin: Capillary refill takes less than 2 seconds. There is pallor.     ED Treatments / Results  Labs (all labs ordered are listed, but only abnormal results are displayed) Labs Reviewed  COMPREHENSIVE METABOLIC PANEL - Abnormal; Notable for the following:       Result Value   CO2 20 (*)    Glucose, Bld 104 (*)    BUN 28 (*)    Creatinine, Ser 1.28 (*)    Calcium 8.6 (*)    Total Protein 6.4 (*)    Albumin 3.3 (*)    AST 50 (*)    GFR calc non Af Amer 48 (*)    GFR calc Af Amer 56 (*)    All other components within normal limits  CBC WITH DIFFERENTIAL/PLATELET - Abnormal; Notable for the following:    RBC 2.55 (*)    Hemoglobin 8.2 (*)    HCT 26.0 (*)    MCV 102.0 (*)    Platelets 100 (*)    All other components within normal limits  PROTIME-INR - Abnormal; Notable for the following:    Prothrombin Time 22.8 (*)    All other components within normal limits  CULTURE, BLOOD (ROUTINE X 2)  CULTURE, BLOOD (ROUTINE X 2)  URINE  CULTURE  URINALYSIS, ROUTINE W REFLEX MICROSCOPIC  I-STAT CG4 LACTIC ACID, ED  I-STAT CG4 LACTIC ACID, ED    EKG  EKG Interpretation  Date/Time:  Friday December 27 2016 18:59:12 EDT Ventricular Rate:  70 PR Interval:    QRS Duration: 140 QT Interval:  450 QTC Calculation: 483 R Axis:   -144 Text Interpretation:  Atrial fibrillation Consider left ventricular hypertrophy Borderline T abnormalities, lateral leads Borderline prolonged QT interval VENTRICULAR PACING Confirmed by Benjiman Core [phone]) on 12/27/2016 10:52:46 PM  Radiology Dg Chest 2 View  Result Date: 12/27/2016 CLINICAL DATA:  Larey Seat and hit head EXAM: CHEST  2 VIEW COMPARISON:  10/30/2012 FINDINGS: Post sternotomy changes and valvular prosthesis. Right-sided pacing device, similar compared to prior radiograph. Small bilateral pleural effusions. Hazy left basilar atelectasis or infiltrate. Mild cardiomegaly. Aortic atherosclerosis. Negative for pneumothorax. IMPRESSION: 1. Small pleural effusions. Hazy basilar atelectasis or infiltrate on the left 2. Mild cardiomegaly Electronically Signed   By: Jasmine Pang M.D.   On: 12/27/2016 20:20   Dg Wrist Complete Right  Result Date: 12/27/2016 CLINICAL DATA:  Right arm swelling with redness EXAM: RIGHT WRIST - COMPLETE 3+ VIEW COMPARISON:  None. FINDINGS: Diffuse osteopenia. Widening of the scaffold lunate interval up to 6 mm. Small erosive change at the base of the fifth metacarpal and ulnar aspect of the fifth CMC joint. Lucency at the hamate bone. Prominent fibrocartilage calcification. Diffuse soft tissue swelling. Vascular calcification. No soft tissue gas. IMPRESSION: 1. Osteopenia without acute fracture 2. Soft tissue swelling. Suspicion of erosive changes at the base of the fifth metacarpal and ulnar aspect of the hamate bone, which may be secondary to osteomyelitis/infection in the appropriate clinical setting. 3. Chondrocalcinosis 4. Widened scaffold lunate interval,  consistent with ligamentous abnormality. 5. Extensive vascular calcifications. Electronically Signed   By: Jasmine Pang M.D.   On: 12/27/2016 20:17   Ct Head Wo Contrast  Result Date: 12/27/2016 CLINICAL DATA:  83 year old male with injury following fall today. Initial encounter. EXAM: CT HEAD WITHOUT CONTRAST TECHNIQUE: Contiguous axial images were obtained from the base of the skull through the vertex without intravenous contrast. COMPARISON:  04/22/2016 and prior CTs FINDINGS: Brain: No evidence of acute infarction, hemorrhage, hydrocephalus, extra-axial collection or mass lesion/mass effect. Atrophy and chronic small-vessel white matter ischemic changes are again noted. Vascular: Intracranial atherosclerotic calcifications noted. Skull: Normal. Negative for fracture or focal lesion. Sinuses/Orbits: No acute finding. Other: None IMPRESSION: No evidence of acute intracranial abnormality. Atrophy and chronic small-vessel white matter ischemic changes. Electronically Signed   By: Harmon Pier M.D.   On: 12/27/2016 19:58   Dg Hand Complete Right  Result Date: 12/27/2016 CLINICAL DATA:  Painful, swollen, red EXAM: RIGHT HAND - COMPLETE 3+ VIEW COMPARISON:  None. FINDINGS: Osteopenia. No fracture or malalignment. Mild degenerative changes at the DIP and PIP joints. Erosive change at the base of fifth metacarpal and CMC joint. IMPRESSION: 1. No fracture or malalignment 2. Erosive change at the base of fifth metacarpal/CMC joint raises suspicion for infection. Electronically Signed   By: Jasmine Pang M.D.   On: 12/27/2016 20:19    Procedures Procedures (including critical care time)  Medications Ordered in ED Medications - No data to display   Initial Impression / Assessment and Plan / ED Course  I have reviewed the triage vital signs and the nursing notes.  Pertinent labs & imaging results that were available during my care of the patient were reviewed by me and considered in my medical decision  making (see chart for details).     Patient presents with fever and has had confusion. Has had some nausea vomiting and diarrhea. X-ray showed atelectasis versus infiltrate but has not had a cough. However right forearm and hand is red and swollen. Normal lactic acid. Normal white count. Have discussed with Dr. Amanda Pea from hand surgery and he will come see the patient. Urine still pending patient will require admission for mental status changes with fever and cellulitis. Patient had fallen and hit his head, but  has negative head CT. He is however on Coumadin.  Final Clinical Impressions(s) / ED Diagnoses   Final diagnoses:  Fall, initial encounter  Cellulitis of hand, right  Anemia, unspecified type    New Prescriptions New Prescriptions   No medications on file     Benjiman Core, MD 12/27/16 2254

## 2016-12-27 NOTE — Progress Notes (Signed)
Orthopedic Tech Progress Note Patient Details:  Joseph Watts Feb 18, 1927 [id number]  Ortho Devices Type of Ortho Device: Velcro wrist forearm splint Ortho Device/Splint Location: rue Ortho Device/Splint Interventions: Ordered, Application, Adjustment   Trinna Post 12/27/2016, 11:42 PM

## 2016-12-27 NOTE — ED Notes (Signed)
ED Provider at bedside. 

## 2016-12-28 ENCOUNTER — Encounter (HOSPITAL_COMMUNITY): Payer: Self-pay | Admitting: Internal Medicine

## 2016-12-28 DIAGNOSIS — W19XXXA Unspecified fall, initial encounter: Secondary | ICD-10-CM

## 2016-12-28 DIAGNOSIS — D61818 Other pancytopenia: Secondary | ICD-10-CM | POA: Diagnosis present

## 2016-12-28 DIAGNOSIS — L03113 Cellulitis of right upper limb: Principal | ICD-10-CM

## 2016-12-28 DIAGNOSIS — I482 Chronic atrial fibrillation: Secondary | ICD-10-CM

## 2016-12-28 LAB — BASIC METABOLIC PANEL
ANION GAP: 7 (ref 5–15)
BUN: 32 mg/dL — ABNORMAL HIGH (ref 6–20)
CHLORIDE: 110 mmol/L (ref 101–111)
CO2: 20 mmol/L — AB (ref 22–32)
Calcium: 8.2 mg/dL — ABNORMAL LOW (ref 8.9–10.3)
Creatinine, Ser: 1.27 mg/dL — ABNORMAL HIGH (ref 0.61–1.24)
GFR calc non Af Amer: 49 mL/min — ABNORMAL LOW (ref >60)
GFR, EST AFRICAN AMERICAN: 56 mL/min — AB (ref >60)
Glucose, Bld: 137 mg/dL — ABNORMAL HIGH (ref 65–99)
Potassium: 4.8 mmol/L (ref 3.5–5.1)
Sodium: 137 mmol/L (ref 135–145)

## 2016-12-28 LAB — CBC
HEMATOCRIT: 25.9 % — AB (ref 39.0–52.0)
HEMOGLOBIN: 8.3 g/dL — AB (ref 13.0–17.0)
MCH: 32.3 pg (ref 26.0–34.0)
MCHC: 32 g/dL (ref 30.0–36.0)
MCV: 100.8 fL — AB (ref 78.0–100.0)
Platelets: 81 10*3/uL — ABNORMAL LOW (ref 150–400)
RBC: 2.57 MIL/uL — AB (ref 4.22–5.81)
RDW: 13.6 % (ref 11.5–15.5)
WBC: 8.2 10*3/uL (ref 4.0–10.5)

## 2016-12-28 MED ORDER — GLUCERNA SHAKE PO LIQD
237.0000 mL | Freq: Three times a day (TID) | ORAL | Status: DC
  Administered 2016-12-28 – 2016-12-31 (×9): 237 mL via ORAL
  Filled 2016-12-28: qty 237

## 2016-12-28 MED ORDER — ACETAMINOPHEN 325 MG PO TABS
650.0000 mg | ORAL_TABLET | Freq: Four times a day (QID) | ORAL | Status: DC | PRN
  Administered 2016-12-28 – 2016-12-30 (×2): 650 mg via ORAL
  Filled 2016-12-28 (×2): qty 2

## 2016-12-28 MED ORDER — TRIAMCINOLONE ACETONIDE 0.1 % EX CREA
1.0000 "application " | TOPICAL_CREAM | CUTANEOUS | Status: DC | PRN
  Filled 2016-12-28: qty 15

## 2016-12-28 MED ORDER — TAMSULOSIN HCL 0.4 MG PO CAPS
0.4000 mg | ORAL_CAPSULE | Freq: Every day | ORAL | Status: DC
Start: 2016-12-28 — End: 2016-12-31
  Administered 2016-12-28 – 2016-12-30 (×4): 0.4 mg via ORAL
  Filled 2016-12-28 (×5): qty 1

## 2016-12-28 MED ORDER — ONDANSETRON HCL 4 MG PO TABS: 4.0000 mg | ORAL_TABLET | Freq: Four times a day (QID) | ORAL | Status: DC | PRN

## 2016-12-28 MED ORDER — WARFARIN SODIUM 3 MG PO TABS
3.0000 mg | ORAL_TABLET | ORAL | Status: DC
  Administered 2016-12-29 – 2016-12-30 (×2): 3 mg via ORAL
  Filled 2016-12-28 (×2): qty 1

## 2016-12-28 MED ORDER — FINASTERIDE 5 MG PO TABS
5.0000 mg | ORAL_TABLET | Freq: Every day | ORAL | Status: DC
  Administered 2016-12-28 – 2016-12-31 (×4): 5 mg via ORAL
  Filled 2016-12-28 (×5): qty 1

## 2016-12-28 MED ORDER — ONDANSETRON HCL 4 MG/2ML IJ SOLN: 4.0000 mg | Freq: Four times a day (QID) | INTRAMUSCULAR | Status: DC | PRN

## 2016-12-28 MED ORDER — FLUOCINONIDE 0.05 % EX OINT
1.0000 "application " | TOPICAL_OINTMENT | Freq: Every day | CUTANEOUS | Status: DC | PRN
  Filled 2016-12-28: qty 15

## 2016-12-28 MED ORDER — WARFARIN - PHARMACIST DOSING INPATIENT: Freq: Every day | Status: DC

## 2016-12-28 MED ORDER — CARVEDILOL 25 MG PO TABS
25.0000 mg | ORAL_TABLET | Freq: Two times a day (BID) | ORAL | Status: DC
  Administered 2016-12-28 – 2016-12-31 (×7): 25 mg via ORAL
  Filled 2016-12-28 (×8): qty 1

## 2016-12-28 MED ORDER — WARFARIN SODIUM 4 MG PO TABS
4.0000 mg | ORAL_TABLET | ORAL | Status: DC
  Administered 2016-12-28: 4 mg via ORAL
  Filled 2016-12-28: qty 1

## 2016-12-28 MED ORDER — FLUTICASONE PROPIONATE 50 MCG/ACT NA SUSP
2.0000 | Freq: Every day | NASAL | Status: DC
  Administered 2016-12-29 – 2016-12-31 (×3): 2 via NASAL
  Filled 2016-12-28 (×2): qty 16

## 2016-12-28 MED ORDER — ACETAMINOPHEN 650 MG RE SUPP: 650.0000 mg | Freq: Four times a day (QID) | RECTAL | Status: DC | PRN

## 2016-12-28 MED ORDER — FERROUS SULFATE 325 (65 FE) MG PO TABS
325.0000 mg | ORAL_TABLET | Freq: Every day | ORAL | Status: DC
  Administered 2016-12-28 – 2016-12-31 (×4): 325 mg via ORAL
  Filled 2016-12-28 (×5): qty 1

## 2016-12-28 MED ORDER — DORZOLAMIDE HCL-TIMOLOL MAL 2-0.5 % OP SOLN
1.0000 [drp] | Freq: Two times a day (BID) | OPHTHALMIC | Status: DC
  Administered 2016-12-28 – 2016-12-31 (×6): 1 [drp] via OPHTHALMIC
  Filled 2016-12-28 (×2): qty 10

## 2016-12-28 MED ORDER — LATANOPROST 0.005 % OP SOLN
1.0000 [drp] | Freq: Every day | OPHTHALMIC | Status: DC
  Administered 2016-12-28 – 2016-12-30 (×3): 1 [drp] via OPHTHALMIC
  Filled 2016-12-28: qty 2.5

## 2016-12-28 MED ORDER — PIPERACILLIN-TAZOBACTAM 3.375 G IVPB
3.3750 g | Freq: Three times a day (TID) | INTRAVENOUS | Status: DC
  Administered 2016-12-28 – 2016-12-30 (×8): 3.375 g via INTRAVENOUS
  Filled 2016-12-28 (×8): qty 50

## 2016-12-28 MED ORDER — MIRTAZAPINE 15 MG PO TABS
15.0000 mg | ORAL_TABLET | Freq: Every day | ORAL | Status: DC
  Administered 2016-12-28 – 2016-12-30 (×4): 15 mg via ORAL
  Filled 2016-12-28 (×4): qty 1

## 2016-12-28 MED ORDER — LORATADINE 10 MG PO TABS
10.0000 mg | ORAL_TABLET | Freq: Every day | ORAL | Status: DC
  Administered 2016-12-28 – 2016-12-31 (×4): 10 mg via ORAL
  Filled 2016-12-28 (×5): qty 1

## 2016-12-28 NOTE — H&P (Signed)
History and Physical    Raden Byington ZHY:[id number] DOB: 02/08/1927 DOA: 12/27/2016  PCP: Alysia Penna, MD  Patient coming from: Home.  Chief Complaint: Fall.  HPI: Joseph Watts is a 83 y.o. male with history of nonischemic cardiomyopathy status post TAVR, status post ICD and BiV placement, permanent atrial fibrillation, pancytopenia, chronic kidney disease stage III and on Coumadin was brought to the ER the patient had a fall. As per the patient started patient has not been doing well for last 2 days with weakness and confusion and also yesterday morning had 4 episodes of nausea and vomiting and diarrhea. Patient while walking at in his home had a fall which was unwitnessed and when his wife went to check on him he was on the floor and his hit his head but did not lose consciousness. Denies any chest pain or shortness of breath.   ED Course: In the ER patient was found to be febrile and tachycardic and initially confused and by the time I examined patient is back to baseline. On exam his right hand looks erythematous but I was able to move all his fingers. X-rays of the right hand was concerning for possible osteomyelitis and hand surgeon Dr. Amanda Pea was consulted and at this time hand surgeon felt that patient's symptoms are more concerning for hand cellulitis and does not have any signs of osteomyelitis. Blood cultures were obtained and patient was placed on antibiotics. Chest x-ray was showing possible infiltrates.  Review of Systems: As per HPI, rest all negative.   Past Medical History:  Diagnosis Date  . Aortic valve replaced    s/p TAVR  . BPH (benign prostatic hyperplasia)   . Cardiomyopathy (HCC)   . Complete heart block (HCC)   . Diabetes mellitus   . Hypercholesteremia   . Hyperlipemia   . Hypertension   . Iron deficiency anemia   . Permanent atrial fibrillation [facility])     Past Surgical History:  Procedure Laterality Date  . PACEMAKER INSERTION  05/12/10   MDT  Consult CRT-P implanted at [facility] by Dr Chryl Heck  . TRANSCATHETER AORTIC VALVE REPLACEMENT, TRANSAORTIC  2012   [facility]     reports that he has never smoked. He has never used smokeless tobacco. He reports that he does not drink alcohol or use drugs.  Allergies  Allergen Reactions  . Motrin [Ibuprofen] Other (See Comments)    Instructed by MD not to take due to Warfarin     Family History  Problem Relation Age of Onset  . Hypertension Unknown     Prior to Admission medications   Medication Sig Start Date End Date Taking? Authorizing Provider  carvedilol (COREG) 25 MG tablet Take 25 mg by mouth 2 (two) times daily with a meal.   Yes [provider]  cetirizine (ZYRTEC) 10 MG tablet Take 10 mg by mouth daily.    Yes [provider]  dorzolamide-timolol (COSOPT) 22.3-6.8 MG/ML ophthalmic solution Place 1 drop into both eyes 2 (two) times daily.   Yes [provider]  ferrous sulfate 325 (65 FE) MG tablet Take 162.5 mg by mouth daily with breakfast.  09/03/11  Yes Si Gaul, MD  finasteride (PROSCAR) 5 MG tablet Take 5 mg by mouth daily.     Yes [provider]  fluocinonide (LIDEX) 0.05 % ointment Apply 1 application topically daily as needed (skin care).    Yes [provider]  fluticasone (FLONASE) 50 MCG/ACT nasal spray Place 2 sprays into the nose as  needed for allergies.    Yes [provider]  furosemide (LASIX) 20 MG tablet Take 1 tablet (20 mg) daily x 3 days and then only as directed. Patient taking differently: Take 20 mg by mouth daily as needed for fluid.  11/14/16  Yes [facility] Salvia, MD  latanoprost (XALATAN) 0.005 % ophthalmic solution Place 1 drop into both eyes at bedtime.     Yes [provider]  mirtazapine (REMERON) 15 MG tablet Take 15 mg by mouth at bedtime.   Yes [provider]  Tamsulosin HCl (FLOMAX) 0.4 MG CAPS Take 0.4 mg by mouth at bedtime.    Yes [provider]  triamcinolone  cream (KENALOG) 0.1 % Apply 1 application topically as needed (itching).   Yes [provider]  VITAMIN K PO Take 100 mcg by mouth daily.    Yes [provider]  warfarin (COUMADIN) 4 MG tablet Take 3-4 mg by mouth every morning. Tuesday, thursday Saturday 4 mg. All other days 3 mg   Yes [provider]    Physical Exam: Vitals:   12/27/16 2330 12/28/16 0000 12/28/16 0012 12/28/16 0042  BP: (!) 142/59 (!) 132/50  (!) 133/42  Pulse: 70 70  70  Resp:  (!) 21  18  Temp:   99.1 F (37.3 C) 98.1 F (36.7 C)  TempSrc:   Oral Oral  SpO2: 95% 97%  97%      Constitutional: Moderately built and nourished. Vitals:   12/27/16 2330 12/28/16 0000 12/28/16 0012 12/28/16 0042  BP: (!) 142/59 (!) 132/50  (!) 133/42  Pulse: 70 70  70  Resp:  (!) 21  18  Temp:   99.1 F (37.3 C) 98.1 F (36.7 C)  TempSrc:   Oral Oral  SpO2: 95% 97%  97%   Eyes: Anicteric no pallor. ENMT: No discharge from the ears eyes nose or mouth. Neck: No mass felt. No neck rigidity. Respiratory: No rhonchi or crepitations. Cardiovascular: S1-S2 heard no murmurs appreciated. Abdomen: Soft nontender bowel sounds present. Musculoskeletal: Right hand is erythematous up to the wrist but was able to move all fingers. Skin: Mild rash on the groin. See musculoskeletal system for the hand explanation. Neurologic: Alert awake oriented to time place and person. Moves all extremities. Psychiatric: Appears normal. Normal affect.   Labs on Admission: I have personally reviewed following labs and imaging studies  CBC:  Recent Labs Lab 12/27/16 1904  WBC 8.5  NEUTROABS 6.4  HGB 8.2*  HCT 26.0*  MCV 102.0*  PLT 100*   Basic Metabolic Panel:  Recent Labs Lab 12/27/16 1904  NA 135  K 5.1  CL 107  CO2 20*  GLUCOSE 104*  BUN 28*  CREATININE 1.28*  CALCIUM 8.6*   GFR: CrCl cannot be calculated (Unknown ideal weight.). Liver Function Tests:  Recent Labs Lab 12/27/16 1904  AST 50*    ALT 29  ALKPHOS 104  BILITOT 0.9  PROT 6.4*  ALBUMIN 3.3*   No results for input(s): LIPASE, AMYLASE in the last 168 hours. No results for input(s): AMMONIA in the last 168 hours. Coagulation Profile:  Recent Labs Lab 12/27/16 1904  INR 2.03   Cardiac Enzymes: No results for input(s): CKTOTAL, CKMB, CKMBINDEX, TROPONINI in the last 168 hours. BNP (last 3 results) No results for input(s): PROBNP in the last 8760 hours. HbA1C: No results for input(s): HGBA1C in the last 72 hours. CBG: No results for input(s): GLUCAP in the last 168 hours. Lipid Profile: No results  for input(s): CHOL, HDL, LDLCALC, TRIG, CHOLHDL, LDLDIRECT in the last 72 hours. Thyroid Function Tests: No results for input(s): TSH, T4TOTAL, FREET4, T3FREE, THYROIDAB in the last 72 hours. Anemia Panel: No results for input(s): VITAMINB12, FOLATE, FERRITIN, TIBC, IRON, RETICCTPCT in the last 72 hours. Urine analysis:    Component Value Date/Time   COLORURINE YELLOW 12/27/2016 2255   APPEARANCEUR CLEAR 12/27/2016 2255   LABSPEC 1.015 12/27/2016 2255   PHURINE 6.0 12/27/2016 2255   GLUCOSEU NEGATIVE 12/27/2016 2255   HGBUR NEGATIVE 12/27/2016 2255   BILIRUBINUR NEGATIVE 12/27/2016 2255   KETONESUR NEGATIVE 12/27/2016 2255   PROTEINUR NEGATIVE 12/27/2016 2255   UROBILINOGEN 0.2 08/26/2010 1247   NITRITE NEGATIVE 12/27/2016 2255   LEUKOCYTESUR NEGATIVE 12/27/2016 2255   Sepsis Labs: @LABRCNTIP (procalcitonin:4,lacticidven:4) )No results found for this or any previous visit (from the past 240 hour(s)).   Radiological Exams on Admission: Dg Chest 2 View  Result Date: 12/27/2016 CLINICAL DATA:  Larey Seat and hit head EXAM: CHEST  2 VIEW COMPARISON:  10/30/2012 FINDINGS: Post sternotomy changes and valvular prosthesis. Right-sided pacing device, similar compared to prior radiograph. Small bilateral pleural effusions. Hazy left basilar atelectasis or infiltrate. Mild cardiomegaly. Aortic atherosclerosis. Negative for  pneumothorax. IMPRESSION: 1. Small pleural effusions. Hazy basilar atelectasis or infiltrate on the left 2. Mild cardiomegaly Electronically Signed   By: Jasmine Pang M.D.   On: 12/27/2016 20:20   Dg Wrist Complete Right  Result Date: 12/27/2016 CLINICAL DATA:  Right arm swelling with redness EXAM: RIGHT WRIST - COMPLETE 3+ VIEW COMPARISON:  None. FINDINGS: Diffuse osteopenia. Widening of the scaffold lunate interval up to 6 mm. Small erosive change at the base of the fifth metacarpal and ulnar aspect of the fifth CMC joint. Lucency at the hamate bone. Prominent fibrocartilage calcification. Diffuse soft tissue swelling. Vascular calcification. No soft tissue gas. IMPRESSION: 1. Osteopenia without acute fracture 2. Soft tissue swelling. Suspicion of erosive changes at the base of the fifth metacarpal and ulnar aspect of the hamate bone, which may be secondary to osteomyelitis/infection in the appropriate clinical setting. 3. Chondrocalcinosis 4. Widened scaffold lunate interval, consistent with ligamentous abnormality. 5. Extensive vascular calcifications. Electronically Signed   By: Jasmine Pang M.D.   On: 12/27/2016 20:17   Ct Head Wo Contrast  Result Date: 12/27/2016 CLINICAL DATA:  83 year old male with injury following fall today. Initial encounter. EXAM: CT HEAD WITHOUT CONTRAST TECHNIQUE: Contiguous axial images were obtained from the base of the skull through the vertex without intravenous contrast. COMPARISON:  04/22/2016 and prior CTs FINDINGS: Brain: No evidence of acute infarction, hemorrhage, hydrocephalus, extra-axial collection or mass lesion/mass effect. Atrophy and chronic small-vessel white matter ischemic changes are again noted. Vascular: Intracranial atherosclerotic calcifications noted. Skull: Normal. Negative for fracture or focal lesion. Sinuses/Orbits: No acute finding. Other: None IMPRESSION: No evidence of acute intracranial abnormality. Atrophy and chronic small-vessel white  matter ischemic changes. Electronically Signed   By: Harmon Pier M.D.   On: 12/27/2016 19:58   Dg Hand Complete Right  Result Date: 12/27/2016 CLINICAL DATA:  Painful, swollen, red EXAM: RIGHT HAND - COMPLETE 3+ VIEW COMPARISON:  None. FINDINGS: Osteopenia. No fracture or malalignment. Mild degenerative changes at the DIP and PIP joints. Erosive change at the base of fifth metacarpal and CMC joint. IMPRESSION: 1. No fracture or malalignment 2. Erosive change at the base of fifth metacarpal/CMC joint raises suspicion for infection. Electronically Signed   By: Jasmine Pang M.D.   On: 12/27/2016 20:19    EKG: Independently  reviewed. A. fib rate controlled.  Assessment/Plan Principal Problem:   Cellulitis of right hand Active Problems:   Chronic systolic heart failure (HCC)   Hx of CABG   Aortic valve replaced   Permanent atrial fibrillation (HCC)   Essential hypertension   Cellulitis   Fall   Pancytopenia (HCC)    1. Cellulitis of the right hand - patient is on vancomycin and Zosyn follow blood cultures. Hand surgery has been consulted. 2. Acute encephalopathy - resolved and back to baseline. Follow cultures. Likely from fever. 3. Permanent atrial fibrillation - chest 2 vasc score is more than 2 and patient is on Coumadin and Coreg. 4. Chronic kidney disease stage III - creatinine appears to be at baseline. Follow metabolic panel. 5. Pancytopenia being followed by oncologist [facility]. On iron supplements. 6. Status post TAVR. 7. Cardiomyopathy status post defibrillator placement and BiV - patient takes Lasix when necessary. Patient has mild edema in the lower extremity. May need Lasix if patient gets hypoxic or short of breath. Closely observe. 8. Diarrhea - if that is any further episodes will need stool studies. 9. Fall likely from weakness and fever - physical therapy.  I have reviewed patient's old charts.   DVT prophylaxis: Coumadin. Code Status: Full code.  Family  Communication: Patient's daughter.  Disposition Plan: Home.  Consults called: Hydrographic surveyor. Physical therapy.  Admission status: Inpatient.    Eduard Clos MD [facility] Pager [phone].  If 7PM-7AM, please contact night-coverage [email or web address] Password [email or web address]  12/28/2016, 1:09 AM

## 2016-12-28 NOTE — Progress Notes (Signed)
@IPLOG @        PROGRESS NOTE                                                                                                                                                                                                             Patient Demographics:    Joseph Watts, is a 83 y.o. male, DOB - 03-13-1927, ZOX:[id number]  Admit date - 12/27/2016   Admitting Physician Eduard Clos, MD  Outpatient Primary MD for the patient is Alysia Penna, MD  LOS - 1  Chief Complaint  Patient presents with  . Fall       Brief Narrative  Joseph Watts is a 83 y.o. male with history of nonischemic cardiomyopathy status post TAVR, status post ICD and BiV placement, permanent atrial fibrillation, pancytopenia, chronic kidney disease stage III and on Coumadin was brought to the ER the patient had a fall. As per the patient started patient has not been doing well for last 2 days with weakness and confusion and also yesterday morning had 4 episodes of nausea and vomiting and diarrhea, Patient while walking at in his home had a fall which was unwitnessed and when his wife went to check on him he was on the floor and his hit his head but did not lose consciousness. Denies any chest pain or shortness of breath. Came to the ER where he was diagnosed with right hand cellulitis hand surgery was consulted and hospitalist team was requested to admit.   Subjective:    Joseph Watts today has, No headache, No chest pain, No abdominal pain - No Nausea, No new weakness tingling or numbness, No Cough - SOB.     Assessment  & Plan :   1. Cellulitis of the right hand - patient is on vancomycin and Zosyn , cultures pending, hand surgeon Dr. Amanda Pea following the patient per hand surgery currently no need for surgical intervention. 2. Acute toxic encephalopathy - resolved and back to baseline. Follow cultures. Likely from fever. 3. Permanent atrial fibrillation -Chads 2 vasc score is more than 2 and patient is on  Coumadin and Coreg. Continue. 4. Chronic kidney disease stage III - creatinine appears to be at baseline. Monitor BMP. 5. Pancytopenia being followed by oncologist [facility]. On iron supplements. 6. Status post TAVR, bovine aortic valve. Stable no acute issues. 7. Cardiomyopathy status post defibrillator placement and BiV - patient takes Lasix when necessary. Patient has mild edema in the lower extremity. May need Lasix if patient gets hypoxic or short of breath. Closely  observe. 8. Diarrhea - currently resolved may recur on antibiotics.  9.   Fall likely from weakness and fever - supportive care as in #1 above, PT eval.    Diet : Diet Heart Room service appropriate? Yes; Fluid consistency: Thin; Fluid restriction: 1200 mL Fluid    Family Communication  :  None present   Code Status :  Full  Disposition Plan  :  TBD, PT to eval  Consults  :  Hand Surg  Procedures  :    CT head. Nonacute.  DVT Prophylaxis  :   Heparin   Lab Results  Component Value Date   PLT 81 (L) 12/28/2016    Inpatient Medications  Scheduled Meds: . carvedilol  25 mg Oral BID WC  . dorzolamide-timolol  1 drop Both Eyes BID  . ferrous sulfate  325 mg Oral Q breakfast  . finasteride  5 mg Oral Daily  . fluticasone  2 spray Each Nare Daily  . latanoprost  1 drop Both Eyes QHS  . loratadine  10 mg Oral Daily  . mirtazapine  15 mg Oral QHS  . tamsulosin  0.4 mg Oral QHS  . [START ON 12/29/2016] warfarin  3 mg Oral Q M,W,F,Su-1800  . warfarin  4 mg Oral Q T,Th,Sat-1800  . Warfarin - Pharmacist Dosing Inpatient   Does not apply q1800   Continuous Infusions: . piperacillin-tazobactam (ZOSYN)  IV 3.375 g (12/28/16 0243)  . vancomycin     PRN Meds:.acetaminophen **OR** acetaminophen, fluocinonide ointment, ondansetron **OR** ondansetron (ZOFRAN) IV, triamcinolone cream  Antibiotics  :    Anti-infectives    Start     Dose/Rate Route Frequency Ordered Stop   12/28/16 2200  vancomycin (VANCOCIN) IVPB 1000  mg/200 mL premix     1,000 mg 200 mL/hr over 60 Minutes Intravenous Every 24 hours 12/27/16 2323     12/28/16 0200  piperacillin-tazobactam (ZOSYN) IVPB 3.375 g     3.375 g 12.5 mL/hr over 240 Minutes Intravenous Every 8 hours 12/28/16 0119     12/27/16 2315  vancomycin (VANCOCIN) IVPB 1000 mg/200 mL premix     1,000 mg 200 mL/hr over 60 Minutes Intravenous  Once 12/27/16 2313 12/28/16 0042         Objective:   Vitals:   12/28/16 0012 12/28/16 0042 12/28/16 0108 12/28/16 0608  BP:  (!) 133/42  (!) 126/44  Pulse:  70  69  Resp:  18  16  Temp: 99.1 F (37.3 C) 98.1 F (36.7 C)  98.7 F (37.1 C)  TempSrc: Oral Oral  Oral  SpO2:  97% 97% 94%  Weight:    69.4 kg (153 lb)    Wt Readings from Last 3 Encounters:  12/28/16 69.4 kg (153 lb)  08/08/16 64.4 kg (142 lb)  07/04/16 64.4 kg (142 lb)     Intake/Output Summary (Last 24 hours) at 12/28/16 1047 Last data filed at 12/28/16 0400  Gross per 24 hour  Intake               50 ml  Output               20 ml  Net               30 ml     Physical Exam  Awake Alert, Oriented X 3, No new F.N deficits, Normal affect [place].AT,PERRAL Supple Neck,No JVD, No cervical lymphadenopathy appriciated.  Symmetrical Chest wall movement, Good air movement bilaterally, CTAB RRR,No Gallops,Rubs , positive  aortic systolic murmur, No Parasternal Heave +ve B.Sounds, Abd Soft, No tenderness, No organomegaly appriciated, No rebound - guarding or rigidity. No Cyanosis, Clubbing or edema, No new Rash or bruise       Data Review:    CBC  Recent Labs Lab 12/27/16 1904 12/28/16 0543  WBC 8.5 8.2  HGB 8.2* 8.3*  HCT 26.0* 25.9*  PLT 100* 81*  MCV 102.0* 100.8*  MCH 32.2 32.3  MCHC 31.5 32.0  RDW 13.3 13.6  LYMPHSABS 1.1  --   MONOABS 0.9  --   EOSABS 0.1  --   BASOSABS 0.0  --     Chemistries   Recent Labs Lab 12/27/16 1904 12/28/16 0543  NA 135 137  K 5.1 4.8  CL 107 110  CO2 20* 20*  GLUCOSE 104* 137*  BUN 28* 32*   CREATININE 1.28* 1.27*  CALCIUM 8.6* 8.2*  AST 50*  --   ALT 29  --   ALKPHOS 104  --   BILITOT 0.9  --    ------------------------------------------------------------------------------------------------------------------ No results for input(s): CHOL, HDL, LDLCALC, TRIG, CHOLHDL, LDLDIRECT in the last 72 hours.  Lab Results  Component Value Date   HGBA1C (H) 06/21/2010    6.2 (NOTE)                                                                       According to the ADA Clinical Practice Recommendations for 2011, when HbA1c is used as a screening test:   >=6.5%   Diagnostic of Diabetes Mellitus           (if abnormal result  is confirmed)  5.7-6.4%   Increased risk of developing Diabetes Mellitus  References:Diagnosis and Classification of Diabetes Mellitus,Diabetes Care,2011,34(Suppl 1):S62-S69 and Standards of Medical Care in         Diabetes - 2011,Diabetes Care,2011,34  (Suppl 1):S11-S61.   ------------------------------------------------------------------------------------------------------------------ No results for input(s): TSH, T4TOTAL, T3FREE, THYROIDAB in the last 72 hours.  Invalid input(s): FREET3 ------------------------------------------------------------------------------------------------------------------ No results for input(s): VITAMINB12, FOLATE, FERRITIN, TIBC, IRON, RETICCTPCT in the last 72 hours.  Coagulation profile  Recent Labs Lab 12/27/16 1904  INR 2.03    No results for input(s): DDIMER in the last 72 hours.  Cardiac Enzymes No results for input(s): CKMB, TROPONINI, MYOGLOBIN in the last 168 hours.  Invalid input(s): CK ------------------------------------------------------------------------------------------------------------------ No results found for: BNP  Micro Results Recent Results (from the past 240 hour(s))  Culture, blood (Routine x 2)     Status: None (Preliminary result)   Collection Time: 12/27/16  7:04 PM  Result Value Ref  Range Status   Specimen Description BLOOD LEFT FOREARM  Final   Special Requests   Final    BOTTLES DRAWN AEROBIC AND ANAEROBIC Blood Culture adequate volume   Culture NO GROWTH < 24 HOURS  Final   Report Status PENDING  Incomplete  Culture, blood (Routine x 2)     Status: None (Preliminary result)   Collection Time: 12/27/16  7:35 PM  Result Value Ref Range Status   Specimen Description BLOOD RIGHT ANTECUBITAL  Final   Special Requests   Final    BOTTLES DRAWN AEROBIC AND ANAEROBIC Blood Culture adequate volume   Culture NO GROWTH < 24 HOURS  Final  Report Status PENDING  Incomplete    Radiology Reports Dg Chest 2 View  Result Date: 12/27/2016 CLINICAL DATA:  Larey Seat and hit head EXAM: CHEST  2 VIEW COMPARISON:  10/30/2012 FINDINGS: Post sternotomy changes and valvular prosthesis. Right-sided pacing device, similar compared to prior radiograph. Small bilateral pleural effusions. Hazy left basilar atelectasis or infiltrate. Mild cardiomegaly. Aortic atherosclerosis. Negative for pneumothorax. IMPRESSION: 1. Small pleural effusions. Hazy basilar atelectasis or infiltrate on the left 2. Mild cardiomegaly Electronically Signed   By: Jasmine Pang M.D.   On: 12/27/2016 20:20   Dg Wrist Complete Right  Result Date: 12/27/2016 CLINICAL DATA:  Right arm swelling with redness EXAM: RIGHT WRIST - COMPLETE 3+ VIEW COMPARISON:  None. FINDINGS: Diffuse osteopenia. Widening of the scaffold lunate interval up to 6 mm. Small erosive change at the base of the fifth metacarpal and ulnar aspect of the fifth CMC joint. Lucency at the hamate bone. Prominent fibrocartilage calcification. Diffuse soft tissue swelling. Vascular calcification. No soft tissue gas. IMPRESSION: 1. Osteopenia without acute fracture 2. Soft tissue swelling. Suspicion of erosive changes at the base of the fifth metacarpal and ulnar aspect of the hamate bone, which may be secondary to osteomyelitis/infection in the appropriate clinical  setting. 3. Chondrocalcinosis 4. Widened scaffold lunate interval, consistent with ligamentous abnormality. 5. Extensive vascular calcifications. Electronically Signed   By: Jasmine Pang M.D.   On: 12/27/2016 20:17   Ct Head Wo Contrast  Result Date: 12/27/2016 CLINICAL DATA:  83 year old male with injury following fall today. Initial encounter. EXAM: CT HEAD WITHOUT CONTRAST TECHNIQUE: Contiguous axial images were obtained from the base of the skull through the vertex without intravenous contrast. COMPARISON:  04/22/2016 and prior CTs FINDINGS: Brain: No evidence of acute infarction, hemorrhage, hydrocephalus, extra-axial collection or mass lesion/mass effect. Atrophy and chronic small-vessel white matter ischemic changes are again noted. Vascular: Intracranial atherosclerotic calcifications noted. Skull: Normal. Negative for fracture or focal lesion. Sinuses/Orbits: No acute finding. Other: None IMPRESSION: No evidence of acute intracranial abnormality. Atrophy and chronic small-vessel white matter ischemic changes. Electronically Signed   By: Harmon Pier M.D.   On: 12/27/2016 19:58   Dg Hand Complete Right  Result Date: 12/27/2016 CLINICAL DATA:  Painful, swollen, red EXAM: RIGHT HAND - COMPLETE 3+ VIEW COMPARISON:  None. FINDINGS: Osteopenia. No fracture or malalignment. Mild degenerative changes at the DIP and PIP joints. Erosive change at the base of fifth metacarpal and CMC joint. IMPRESSION: 1. No fracture or malalignment 2. Erosive change at the base of fifth metacarpal/CMC joint raises suspicion for infection. Electronically Signed   By: Jasmine Pang M.D.   On: 12/27/2016 20:19    Time Spent in minutes  30   Susa Raring M.D on 12/28/2016 at 10:47 AM  Between 7am to 7pm - Pager - [phone] ( page via [email or web address], text pages only, please mention full 10 digit call back number). After 7pm go to [email or web address] - password [facility]

## 2016-12-28 NOTE — Progress Notes (Signed)
ANTICOAGULATION CONSULT NOTE - Initial Consult  Pharmacy Consult for Warfarin  Indication: atrial fibrillation  Allergies  Allergen Reactions  . Motrin [Ibuprofen] Other (See Comments)    Instructed by MD not to take due to Warfarin    Vital Signs: Temp: 98.1 F (36.7 C) (09/01 0042) Temp Source: Oral (09/01 0042) BP: 133/42 (09/01 0042) Pulse Rate: 70 (09/01 0042)  Labs:  Recent Labs  12/27/16 1904  HGB 8.2*  HCT 26.0*  PLT 100*  LABPROT 22.8*  INR 2.03  CREATININE 1.28*    CrCl cannot be calculated (Unknown ideal weight.).   Medical History: Past Medical History:  Diagnosis Date  . Aortic valve replaced    s/p TAVR  . BPH (benign prostatic hyperplasia)   . Cardiomyopathy (HCC)   . Complete heart block (HCC)   . Diabetes mellitus   . Hypercholesteremia   . Hyperlipemia   . Hypertension   . Iron deficiency anemia   . Permanent atrial fibrillation [facility])    Assessment: 83 y/o M on warfarin for afib, pt here with right hand/arm cellulitis, INR is therapeutic at 2.03 on admit, Hgb/plts on the low side, no overt bleeding noted.   Goal of Therapy:  INR 2-3 Monitor platelets by anticoagulation protocol: Yes   Plan:  Warfarin per home regimen (4 mg Tues/Thurs/Sat, 3 mg all other days) Daily PT/INR Monitor for bleeding/Trend Hgb   Abran Duke 12/28/2016,1:24 AM

## 2016-12-28 NOTE — Evaluation (Signed)
Physical Therapy Evaluation Patient Details Name: Joseph Watts MRN: [id number] DOB: June 04, 1926 Today's Date: 12/28/2016   History of Present Illness  Pt is an 83 y/o male admitted after sustaining a fall at home. Pt found to have R hand cellulitis and acute encephalopathy. PMH including but not limited to DM, HTN, HLD, s/p TAVR in 2012 and s/p pacemaker placement 2012.  Clinical Impression  Pt presented supine in bed with HOB elevated, awake and willing to participate in therapy session. Prior to admission, pt reported that he ambulates with use of rollator and is independent with ADLs. Pt lives with his wife in a single level house with six steps to enter. Pt's daughter also lives very close by. Pt currently very limited secondary to weakness, requiring mod A for bed mobility and min A for transfers. Pt limited to side steps at EOB secondary to lethargy. Pt would continue to benefit from skilled physical therapy services at this time while admitted and after d/c to address the below listed limitations in order to improve overall safety and independence with functional mobility.     Follow Up Recommendations SNF;Supervision/Assistance - 24 hour    Equipment Recommendations  None recommended by PT    Recommendations for Other Services       Precautions / Restrictions Precautions Precautions: Fall Restrictions Weight Bearing Restrictions: No      Mobility  Bed Mobility Overal bed mobility: Needs Assistance Bed Mobility: Supine to Sit;Sit to Supine     Supine to sit: Mod assist Sit to supine: Min guard   General bed mobility comments: increased time and effort, cueing for sequencing, mod A to elevate trunk to achieve sitting EOB  Transfers Overall transfer level: Needs assistance Equipment used: Rolling walker (2 wheeled) Transfers: Sit to/from Stand Sit to Stand: Min assist         General transfer comment: increased time and effort, min A to power into standing and for  stability with transition  Ambulation/Gait             General Gait Details: pt very lethargic, therefore, limiting mobility this session; pt able to take 3-4 side steps towards his L at EOB with min A for stability and RW  Stairs            Wheelchair Mobility    Modified Rankin (Stroke Patients Only)       Balance Overall balance assessment: Needs assistance Sitting-balance support: Feet supported;Bilateral upper extremity supported Sitting balance-Leahy Scale: Poor Sitting balance - Comments: very kyphotic with anterior shoulders and flexed cervical spine   Standing balance support: During functional activity;Bilateral upper extremity supported Standing balance-Leahy Scale: Poor                               Pertinent Vitals/Pain Pain Assessment: No/denies pain    Home Living Family/patient expects to be discharged to:: Private residence Living Arrangements: Spouse/significant other Available Help at Discharge: Family;Available 24 hours/day Type of Home: House Home Access: Stairs to enter Entrance Stairs-Rails: Right;Left;Can reach both Entrance Stairs-Number of Steps: 6 Home Layout: One level Home Equipment: Shower seat;Cane - single point;Walker - 4 wheels      Prior Function Level of Independence: Independent with assistive device(s)         Comments: ambulates with rollator     Hand Dominance   Dominant Hand: Right    Extremity/Trunk Assessment   Upper Extremity Assessment Upper Extremity Assessment: Defer to OT  evaluation    Lower Extremity Assessment Lower Extremity Assessment: Generalized weakness    Cervical / Trunk Assessment Cervical / Trunk Assessment: Kyphotic  Communication   Communication: No difficulties  Cognition Arousal/Alertness: Lethargic Behavior During Therapy: Flat affect Overall Cognitive Status: No family/caregiver present to determine baseline cognitive functioning Area of Impairment: Problem  solving                             Problem Solving: Slow processing;Difficulty sequencing;Requires verbal cues        General Comments      Exercises     Assessment/Plan    PT Assessment Patient needs continued PT services  PT Problem List Decreased strength;Decreased activity tolerance;Decreased balance;Decreased coordination;Decreased mobility;Decreased knowledge of use of DME;Decreased safety awareness;Decreased knowledge of precautions       PT Treatment Interventions DME instruction;Gait training;Stair training;Functional mobility training;Therapeutic activities;Therapeutic exercise;Neuromuscular re-education;Balance training;Patient/family education    PT Goals (Current goals can be found in the Care Plan section)  Acute Rehab PT Goals Patient Stated Goal: go home PT Goal Formulation: With patient Time For Goal Achievement: 01/11/17 Potential to Achieve Goals: Fair    Frequency Min 3X/week   Barriers to discharge        Co-evaluation               AM-PAC PT "6 Clicks" Daily Activity  Outcome Measure Difficulty turning over in bed (including adjusting bedclothes, sheets and blankets)?: Unable Difficulty moving from lying on back to sitting on the side of the bed? : Unable Difficulty sitting down on and standing up from a chair with arms (e.g., wheelchair, bedside commode, etc,.)?: Unable Help needed moving to and from a bed to chair (including a wheelchair)?: A Lot Help needed walking in hospital room?: A Lot Help needed climbing 3-5 steps with a railing? : A Lot 6 Click Score: 9    End of Session Equipment Utilized During Treatment: Gait belt Activity Tolerance: Patient limited by lethargy;Patient limited by fatigue Patient left: in bed;with call bell/phone within reach;with bed alarm set Nurse Communication: Mobility status PT Visit Diagnosis: Other abnormalities of gait and mobility (R26.89);History of falling (Z91.81);Muscle weakness  (generalized) (M62.81)    Time: 4650-3546 PT Time Calculation (min) (ACUTE ONLY): 24 min   Charges:   PT Evaluation $PT Eval Moderate Complexity: 1 Mod PT Treatments $Therapeutic Activity: 8-22 mins   PT G Codes:        [place], PT, DPT [phone]   Alessandra Bevels Jasin Brazel 12/28/2016, 10:14 AM

## 2016-12-29 DIAGNOSIS — I5022 Chronic systolic (congestive) heart failure: Secondary | ICD-10-CM

## 2016-12-29 DIAGNOSIS — Z952 Presence of prosthetic heart valve: Secondary | ICD-10-CM

## 2016-12-29 DIAGNOSIS — I10 Essential (primary) hypertension: Secondary | ICD-10-CM

## 2016-12-29 DIAGNOSIS — D649 Anemia, unspecified: Secondary | ICD-10-CM

## 2016-12-29 DIAGNOSIS — W19XXXD Unspecified fall, subsequent encounter: Secondary | ICD-10-CM

## 2016-12-29 LAB — BASIC METABOLIC PANEL
ANION GAP: 5 (ref 5–15)
BUN: 38 mg/dL — AB (ref 6–20)
CALCIUM: 8.2 mg/dL — AB (ref 8.9–10.3)
CHLORIDE: 109 mmol/L (ref 101–111)
CO2: 23 mmol/L (ref 22–32)
CREATININE: 1.44 mg/dL — AB (ref 0.61–1.24)
GFR calc non Af Amer: 42 mL/min — ABNORMAL LOW (ref >60)
GFR, EST AFRICAN AMERICAN: 48 mL/min — AB (ref >60)
Glucose, Bld: 125 mg/dL — ABNORMAL HIGH (ref 65–99)
POTASSIUM: 4.3 mmol/L (ref 3.5–5.1)
SODIUM: 137 mmol/L (ref 135–145)

## 2016-12-29 LAB — CBC
HCT: 26.3 % — ABNORMAL LOW (ref 39.0–52.0)
Hemoglobin: 8.6 g/dL — ABNORMAL LOW (ref 13.0–17.0)
MCH: 32.5 pg (ref 26.0–34.0)
MCHC: 32.7 g/dL (ref 30.0–36.0)
MCV: 99.2 fL (ref 78.0–100.0)
PLATELETS: 88 10*3/uL — AB (ref 150–400)
RBC: 2.65 MIL/uL — ABNORMAL LOW (ref 4.22–5.81)
RDW: 13.3 % (ref 11.5–15.5)
WBC: 7.1 10*3/uL (ref 4.0–10.5)

## 2016-12-29 LAB — URINE CULTURE

## 2016-12-29 LAB — PROTIME-INR
INR: 2.63
Prothrombin Time: 27.9 seconds — ABNORMAL HIGH (ref 11.4–15.2)

## 2016-12-29 NOTE — Progress Notes (Signed)
ANTICOAGULATION CONSULT NOTE - Follow-Up Consult  Pharmacy Consult for Warfarin  Indication: atrial fibrillation  Vital Signs: Temp: 97.6 F (36.4 C) (09/02 0543) Temp Source: Oral (09/02 0211) BP: 138/51 (09/02 0543) Pulse Rate: 72 (09/02 0543)  Labs:  Recent Labs  12/27/16 1904 12/28/16 0543 12/29/16 0608  HGB 8.2* 8.3* 8.6*  HCT 26.0* 25.9* 26.3*  PLT 100* 81* 88*  LABPROT 22.8*  --  27.9*  INR 2.03  --  2.63  CREATININE 1.28* 1.27* 1.44*    Estimated Creatinine Clearance: 34.8 mL/min (A) (by C-G formula based on SCr of 1.44 mg/dL (H)).  Assessment: 83 y/o M on warfarin for afib, presented with right hand/arm cellulitis. Therapeutic INR on admit.  INR is therapeutic today at 2.63.  CBC low but stable.  No bleeding noted.  Goal of Therapy:  INR 2-3 Monitor platelets by anticoagulation protocol: Yes   Plan:  Warfarin per home regimen (4 mg Tues/Thurs/Sat, 3 mg all other days) Daily PT/INR Monitor for bleeding/Trend Hgb   Diana L. Marcy Salvo, PharmD, MS PGY1 Pharmacy Resident Pager: [phone]

## 2016-12-29 NOTE — Progress Notes (Signed)
PROGRESS NOTE  Joseph Watts  STM:[id number] DOB: February 14, 1927 DOA: 12/27/2016 PCP: Alysia Penna, MD  Outpatient Specialists: Cardiology, Anne Fu Brief Narrative: Joseph Watts is a 83 y.o. male with a history of NICM s/p TAVR, AFib and complete heart block on coumadin s/p ICD and BiV pacer, pancytopenia, and stage III CKD who was brought to the ED 8/31 after a fall at home. He had a couple days of worsening confusion, weakness, and had an unwitnessed fall. In the ED he was febrile, found to have right hand/arm cellulitis, started on IV antibiotics, and admitted. Hand surgery evaluated the patient recommending antibiotics without the need for operative intervention. Erythema has improved and altered mentation has resolved. He continues to be weak, with PT recommending SNF.   Assessment & Plan: Principal Problem:   Cellulitis of right hand Active Problems:   Chronic systolic heart failure (HCC)   Hx of CABG   Aortic valve replaced   Permanent atrial fibrillation (HCC)   Essential hypertension   Cellulitis   Fall   Pancytopenia (HCC)  Right hand cellulitis: without osteomyelitis/septic joint.  - Continue antibiotics, transition to po in next 24 hours if improving.  - No surgery per hand surgery, Dr. Amanda Pea - Monitoring cultures  Acute toxic metabolic encephalopathy: Due to infection, resolved.   Fall at home: Due to weakness from acute illness. continues to be debilitated due to age in setting of illness. PT recommending SNF.  - CSW consulted for SNF placement. Daughter lives next door, but cannot provide 24hr supervision and wife with whom he lives is severely debilitated as well. Discharge to home with home health would not be a safe discharge for him.   NICM, permanent AFib, complete heart block, s/p bovine TAVR: V-paced rhythm  - Continue coumadin per pharmacy, redosing daily.  - Continue coumadin - Rhythm stable, DC telemetry - Monitor I/O, weights. May require prn lasix as at  home for weight gain, dyspnea.   Stage III CKD: Cr at baseline.  - Monitor, especially with abx.   Pancytopenia: Chronic, stable.  - Monitor daily. If platelets fall further, may need work up.   DVT prophylaxis: Coumadin Code Status: Full Family Communication: Daughter at bedside Disposition Plan: SNF in next 24 - 48 hours.   Consultants:   Orthopedics, Dr. Amanda Pea  Procedures:   None  Antimicrobials:  Vancomycin 8/31 >   Zosyn 9/1 >>   Subjective: Redness improving, still not receded from markings. No fever. Eating ok. No vomiting or further diarrhea. Mentation has returned to normal per daughter. No pain.   Objective: Vitals:   12/29/16 0206 12/29/16 0211 12/29/16 0500 12/29/16 0543  BP:  (!) 133/45  (!) 138/51  Pulse:  70  72  Resp:  16  16  Temp: 98.2 F (36.8 C) 98.2 F (36.8 C)  97.6 F (36.4 C)  TempSrc: Oral Oral    SpO2:    96%  Weight:  69.4 kg (153 lb) 69.4 kg (153 lb)   Height:  6\' 1"  (1.854 m)      Intake/Output Summary (Last 24 hours) at 12/29/16 1025 Last data filed at 12/29/16 0931  Gross per 24 hour  Intake              540 ml  Output              200 ml  Net              340 ml   Filed Weights   12/28/16 8921  12/29/16 0211 12/29/16 0500  Weight: 69.4 kg (153 lb) 69.4 kg (153 lb) 69.4 kg (153 lb)    Gen: Pleasant elderly male in no distress Pulm: Non-labored breathing room air. Clear to auscultation bilaterally.  CV: Regular rate and rhythm. No murmur, rub, or gallop. No JVD, trace pedal edema. GI: Abdomen soft, non-tender, non-distended, with normoactive bowel sounds. No organomegaly or masses felt. GU: Condom cath in place.  Ext: Warm, no deformities Skin: Poorly defined splotchy, dull erythema along dorsal right distal forearm extending to volar aspect with edema in dorsum of hand without induration or fluctuance or purulence. Erythema consistently projects to lines of demarcation (from ED).  Neuro: Alert and oriented. No focal  neurological deficits. Psych: Judgement and insight appear normal. Mood & affect appropriate.   Data Reviewed: I have personally reviewed following labs and imaging studies  CBC:  Recent Labs Lab 12/27/16 1904 12/28/16 0543 12/29/16 0608  WBC 8.5 8.2 7.1  NEUTROABS 6.4  --   --   HGB 8.2* 8.3* 8.6*  HCT 26.0* 25.9* 26.3*  MCV 102.0* 100.8* 99.2  PLT 100* 81* 88*   Basic Metabolic Panel:  Recent Labs Lab 12/27/16 1904 12/28/16 0543 12/29/16 0608  NA 135 137 137  K 5.1 4.8 4.3  CL 107 110 109  CO2 20* 20* 23  GLUCOSE 104* 137* 125*  BUN 28* 32* 38*  CREATININE 1.28* 1.27* 1.44*  CALCIUM 8.6* 8.2* 8.2*   GFR: Estimated Creatinine Clearance: 34.8 mL/min (A) (by C-G formula based on SCr of 1.44 mg/dL (H)). Liver Function Tests:  Recent Labs Lab 12/27/16 1904  AST 50*  ALT 29  ALKPHOS 104  BILITOT 0.9  PROT 6.4*  ALBUMIN 3.3*   No results for input(s): LIPASE, AMYLASE in the last 168 hours. No results for input(s): AMMONIA in the last 168 hours. Coagulation Profile:  Recent Labs Lab 12/27/16 1904 12/29/16 0608  INR 2.03 2.63   Cardiac Enzymes: No results for input(s): CKTOTAL, CKMB, CKMBINDEX, TROPONINI in the last 168 hours. BNP (last 3 results) No results for input(s): PROBNP in the last 8760 hours. HbA1C: No results for input(s): HGBA1C in the last 72 hours. CBG: No results for input(s): GLUCAP in the last 168 hours. Lipid Profile: No results for input(s): CHOL, HDL, LDLCALC, TRIG, CHOLHDL, LDLDIRECT in the last 72 hours. Thyroid Function Tests: No results for input(s): TSH, T4TOTAL, FREET4, T3FREE, THYROIDAB in the last 72 hours. Anemia Panel: No results for input(s): VITAMINB12, FOLATE, FERRITIN, TIBC, IRON, RETICCTPCT in the last 72 hours. Urine analysis:    Component Value Date/Time   COLORURINE YELLOW 12/27/2016 2255   APPEARANCEUR CLEAR 12/27/2016 2255   LABSPEC 1.015 12/27/2016 2255   PHURINE 6.0 12/27/2016 2255   GLUCOSEU NEGATIVE  12/27/2016 2255   HGBUR NEGATIVE 12/27/2016 2255   BILIRUBINUR NEGATIVE 12/27/2016 2255   KETONESUR NEGATIVE 12/27/2016 2255   PROTEINUR NEGATIVE 12/27/2016 2255   UROBILINOGEN 0.2 08/26/2010 1247   NITRITE NEGATIVE 12/27/2016 2255   LEUKOCYTESUR NEGATIVE 12/27/2016 2255   Recent Results (from the past 240 hour(s))  Culture, blood (Routine x 2)     Status: None (Preliminary result)   Collection Time: 12/27/16  7:04 PM  Result Value Ref Range Status   Specimen Description BLOOD LEFT FOREARM  Final   Special Requests   Final    BOTTLES DRAWN AEROBIC AND ANAEROBIC Blood Culture adequate volume   Culture NO GROWTH < 24 HOURS  Final   Report Status PENDING  Incomplete  Culture, blood (  Routine x 2)     Status: None (Preliminary result)   Collection Time: 12/27/16  7:35 PM  Result Value Ref Range Status   Specimen Description BLOOD RIGHT ANTECUBITAL  Final   Special Requests   Final    BOTTLES DRAWN AEROBIC AND ANAEROBIC Blood Culture adequate volume   Culture NO GROWTH < 24 HOURS  Final   Report Status PENDING  Incomplete  Urine culture     Status: None (Preliminary result)   Collection Time: 12/27/16 10:55 PM  Result Value Ref Range Status   Specimen Description URINE, CLEAN CATCH  Final   Special Requests NONE  Final   Culture CULTURE REINCUBATED FOR BETTER GROWTH  Final   Report Status PENDING  Incomplete      Radiology Studies: Dg Chest 2 View  Result Date: 12/27/2016 CLINICAL DATA:  Larey Seat and hit head EXAM: CHEST  2 VIEW COMPARISON:  10/30/2012 FINDINGS: Post sternotomy changes and valvular prosthesis. Right-sided pacing device, similar compared to prior radiograph. Small bilateral pleural effusions. Hazy left basilar atelectasis or infiltrate. Mild cardiomegaly. Aortic atherosclerosis. Negative for pneumothorax. IMPRESSION: 1. Small pleural effusions. Hazy basilar atelectasis or infiltrate on the left 2. Mild cardiomegaly Electronically Signed   By: Jasmine Pang M.D.   On:  12/27/2016 20:20   Dg Wrist Complete Right  Result Date: 12/27/2016 CLINICAL DATA:  Right arm swelling with redness EXAM: RIGHT WRIST - COMPLETE 3+ VIEW COMPARISON:  None. FINDINGS: Diffuse osteopenia. Widening of the scaffold lunate interval up to 6 mm. Small erosive change at the base of the fifth metacarpal and ulnar aspect of the fifth CMC joint. Lucency at the hamate bone. Prominent fibrocartilage calcification. Diffuse soft tissue swelling. Vascular calcification. No soft tissue gas. IMPRESSION: 1. Osteopenia without acute fracture 2. Soft tissue swelling. Suspicion of erosive changes at the base of the fifth metacarpal and ulnar aspect of the hamate bone, which may be secondary to osteomyelitis/infection in the appropriate clinical setting. 3. Chondrocalcinosis 4. Widened scaffold lunate interval, consistent with ligamentous abnormality. 5. Extensive vascular calcifications. Electronically Signed   By: Jasmine Pang M.D.   On: 12/27/2016 20:17   Ct Head Wo Contrast  Result Date: 12/27/2016 CLINICAL DATA:  83 year old male with injury following fall today. Initial encounter. EXAM: CT HEAD WITHOUT CONTRAST TECHNIQUE: Contiguous axial images were obtained from the base of the skull through the vertex without intravenous contrast. COMPARISON:  04/22/2016 and prior CTs FINDINGS: Brain: No evidence of acute infarction, hemorrhage, hydrocephalus, extra-axial collection or mass lesion/mass effect. Atrophy and chronic small-vessel white matter ischemic changes are again noted. Vascular: Intracranial atherosclerotic calcifications noted. Skull: Normal. Negative for fracture or focal lesion. Sinuses/Orbits: No acute finding. Other: None IMPRESSION: No evidence of acute intracranial abnormality. Atrophy and chronic small-vessel white matter ischemic changes. Electronically Signed   By: Harmon Pier M.D.   On: 12/27/2016 19:58   Dg Hand Complete Right  Result Date: 12/27/2016 CLINICAL DATA:  Painful, swollen,  red EXAM: RIGHT HAND - COMPLETE 3+ VIEW COMPARISON:  None. FINDINGS: Osteopenia. No fracture or malalignment. Mild degenerative changes at the DIP and PIP joints. Erosive change at the base of fifth metacarpal and CMC joint. IMPRESSION: 1. No fracture or malalignment 2. Erosive change at the base of fifth metacarpal/CMC joint raises suspicion for infection. Electronically Signed   By: Jasmine Pang M.D.   On: 12/27/2016 20:19    Scheduled Meds: . carvedilol  25 mg Oral BID WC  . dorzolamide-timolol  1 drop Both Eyes BID  .  feeding supplement (GLUCERNA SHAKE)  237 mL Oral TID BM  . ferrous sulfate  325 mg Oral Q breakfast  . finasteride  5 mg Oral Daily  . fluticasone  2 spray Each Nare Daily  . latanoprost  1 drop Both Eyes QHS  . loratadine  10 mg Oral Daily  . mirtazapine  15 mg Oral QHS  . tamsulosin  0.4 mg Oral QHS  . warfarin  3 mg Oral Q M,W,F,Su-1800  . warfarin  4 mg Oral Q T,Th,Sat-1800  . Warfarin - Pharmacist Dosing Inpatient   Does not apply q1800   Continuous Infusions: . piperacillin-tazobactam (ZOSYN)  IV 3.375 g (12/29/16 0606)  . vancomycin Stopped (12/28/16 2319)     LOS: 2 days   Time spent: 25 minutes.  Hazeline Junker, MD [facility] Pager [phone]  If 7PM-7AM, please contact night-coverage [email or web address] Password [facility] 12/29/2016, 10:25 AM

## 2016-12-29 NOTE — Progress Notes (Signed)
Patient ID: Joseph Watts, male   DOB: [birth year], 83 y.o.   MRN: [id number] Patient seen and examined at bedside.  Patient looks well.  Patient has his bandage removed. The cellulitis is resolved nicely.  He is nontender with passive and active range of motion.  His parameters look quite well.  I discussed with he and his daughter all issues.  At present juncture the saline chain has resolved. There is no evidence of osteomyelitis or advancing infection. I feel the radiology report is a complete over read and would not direct any attention towards osteomyelitis as we oftentimes see erosions in conjunction with osteoarthritis in patients of his age and.  We will be available for any problems otherwise we will going to sign off at this juncture and I discussed this with his daughter.  Alexza Norbeck M.D.

## 2016-12-30 DIAGNOSIS — W19XXXA Unspecified fall, initial encounter: Secondary | ICD-10-CM

## 2016-12-30 LAB — CBC
HCT: 25.9 % — ABNORMAL LOW (ref 39.0–52.0)
Hemoglobin: 8.4 g/dL — ABNORMAL LOW (ref 13.0–17.0)
MCH: 32.4 pg (ref 26.0–34.0)
MCHC: 32.4 g/dL (ref 30.0–36.0)
MCV: 100 fL (ref 78.0–100.0)
Platelets: 85 10*3/uL — ABNORMAL LOW (ref 150–400)
RBC: 2.59 MIL/uL — ABNORMAL LOW (ref 4.22–5.81)
RDW: 13.2 % (ref 11.5–15.5)
WBC: 5.9 10*3/uL (ref 4.0–10.5)

## 2016-12-30 LAB — BASIC METABOLIC PANEL
Anion gap: 7 (ref 5–15)
BUN: 33 mg/dL — AB (ref 6–20)
CO2: 20 mmol/L — AB (ref 22–32)
CREATININE: 1.34 mg/dL — AB (ref 0.61–1.24)
Calcium: 8.1 mg/dL — ABNORMAL LOW (ref 8.9–10.3)
Chloride: 110 mmol/L (ref 101–111)
GFR calc Af Amer: 53 mL/min — ABNORMAL LOW (ref >60)
GFR calc non Af Amer: 46 mL/min — ABNORMAL LOW (ref >60)
GLUCOSE: 135 mg/dL — AB (ref 65–99)
POTASSIUM: 3.8 mmol/L (ref 3.5–5.1)
SODIUM: 137 mmol/L (ref 135–145)

## 2016-12-30 LAB — PROTIME-INR
INR: 2.66
Prothrombin Time: 28.1 seconds — ABNORMAL HIGH (ref 11.4–15.2)

## 2016-12-30 MED ORDER — CEPHALEXIN 500 MG PO CAPS
500.0000 mg | ORAL_CAPSULE | Freq: Two times a day (BID) | ORAL | Status: DC
  Administered 2016-12-30 – 2016-12-31 (×3): 500 mg via ORAL
  Filled 2016-12-30 (×3): qty 1

## 2016-12-30 NOTE — Evaluation (Signed)
Occupational Therapy Evaluation Patient Details Name: Joseph Watts MRN: [id number] DOB: 04/01/1927 Today's Date: 12/30/2016    History of Present Illness Pt is an 83 y/o male admitted after sustaining a fall at home. Pt found to have R hand cellulitis and acute encephalopathy. PMH including but not limited to DM, HTN, HLD, s/p TAVR in 2012 and s/p pacemaker placement 2012.   Clinical Impression   PTA, pt was independent with ADL and functional mobility. He currently requires overall min assist for ADL participation. He presents with generalized weakness, decreased activity tolerance for ADL, and decreased balance resulting in instability impacting his safety and independence with ADL participation. Recommend short-term SNF placement for continued rehabilitation to maximize return to independent PLOF prior to returning home with his wife. Pt is in agreement. OT will continue to follow while admitted.     Follow Up Recommendations  SNF    Equipment Recommendations  Other (comment) (TBD at next venue of care)    Recommendations for Other Services       Precautions / Restrictions Precautions Precautions: Fall Restrictions Weight Bearing Restrictions: No      Mobility Bed Mobility Overal bed mobility: Needs Assistance Bed Mobility: Sit to Supine       Sit to supine: Supervision   General bed mobility comments: Increased time and supervision for safety to return to bed. Sitting at EOB on OT arrival.   Transfers Overall transfer level: Needs assistance Equipment used: Rolling walker (2 wheeled) Transfers: Sit to/from Stand Sit to Stand: Min assist         General transfer comment: Min assist from bed and BSC. Heavy use of grab bar for rise from commode.     Balance Overall balance assessment: Needs assistance Sitting-balance support: Feet supported;Bilateral upper extremity supported Sitting balance-Leahy Scale: Fair     Standing balance support: During functional  activity;Bilateral upper extremity supported Standing balance-Leahy Scale: Poor                             ADL either performed or assessed with clinical judgement   ADL Overall ADL's : Needs assistance/impaired Eating/Feeding: Set up;Sitting Eating/Feeding Details (indicate cue type and reason): sitting with back support Grooming: Minimal assistance;Standing;Wash/dry hands   Upper Body Bathing: Minimal assistance;Sitting   Lower Body Bathing: Minimal assistance;Sit to/from stand   Upper Body Dressing : Minimal assistance;Sitting   Lower Body Dressing: Minimal assistance;Sit to/from stand   Toilet Transfer: Minimal assistance;RW   Toileting- Clothing Manipulation and Hygiene: Supervision/safety;Sitting/lateral lean       Functional mobility during ADLs: Minimal assistance;Rolling walker General ADL Comments: Pt with significantly decreased activity tolerance for ADL and is at significant risk of falling due to instability in standing.      Vision Patient Visual Report: No change from baseline Vision Assessment?: No apparent visual deficits     Perception     Praxis      Pertinent Vitals/Pain Pain Assessment: No/denies pain     Hand Dominance Right   Extremity/Trunk Assessment Upper Extremity Assessment Upper Extremity Assessment: Generalized weakness   Lower Extremity Assessment Lower Extremity Assessment: Generalized weakness   Cervical / Trunk Assessment Cervical / Trunk Assessment: Kyphotic   Communication Communication Communication: No difficulties   Cognition Arousal/Alertness: Lethargic Behavior During Therapy: Flat affect Overall Cognitive Status: No family/caregiver present to determine baseline cognitive functioning Area of Impairment: Awareness;Problem solving;Safety/judgement  Safety/Judgement: Decreased awareness of safety;Decreased awareness of deficits Awareness: Emergent Problem Solving: Slow  processing;Difficulty sequencing;Requires verbal cues     General Comments       Exercises     Shoulder Instructions      Home Living Family/patient expects to be discharged to:: Private residence Living Arrangements: Spouse/significant other Available Help at Discharge: Family;Available 24 hours/day Type of Home: House Home Access: Stairs to enter [employer] of Steps: 6 Entrance Stairs-Rails: Right;Left;Can reach both Home Layout: One level     Bathroom Shower/Tub: Walk-in shower         Home Equipment: Shower seat;Cane - single point;Walker - 4 wheels          Prior Functioning/Environment Level of Independence: Independent with assistive device(s)        Comments: ambulates with rollator        OT Problem List: Decreased strength;Decreased activity tolerance;Impaired balance (sitting and/or standing);Decreased safety awareness;Decreased knowledge of use of DME or AE;Decreased knowledge of precautions;Pain      OT Treatment/Interventions: Self-care/ADL training;Therapeutic exercise;DME and/or AE instruction;Cognitive remediation/compensation;Patient/family education;Therapeutic activities;Balance training    OT Goals(Current goals can be found in the care plan section) Acute Rehab OT Goals Patient Stated Goal: get stronger at SNF then go home OT Goal Formulation: With patient Time For Goal Achievement: 01/13/17 Potential to Achieve Goals: Good ADL Goals Pt Will Perform Grooming: with modified independence;standing Pt Will Perform Upper Body Dressing: with modified independence;sitting Pt Will Perform Lower Body Dressing: with modified independence;sit to/from stand Pt Will Transfer to Toilet: with modified independence;ambulating;regular height toilet Pt Will Perform Toileting - Clothing Manipulation and hygiene: with modified independence;sitting/lateral leans Pt/caregiver will Perform Home Exercise Program: Increased strength;Both right and  left upper extremity;With written HEP provided;With Supervision  OT Frequency: Min 2X/week   Barriers to D/C:            Co-evaluation              AM-PAC PT "6 Clicks" Daily Activity     Outcome Measure Help from another person eating meals?: A Little Help from another person taking care of personal grooming?: A Little Help from another person toileting, which includes using toliet, bedpan, or urinal?: A Little Help from another person bathing (including washing, rinsing, drying)?: A Little Help from another person to put on and taking off regular upper body clothing?: A Little Help from another person to put on and taking off regular lower body clothing?: A Little 6 Click Score: 18   End of Session Equipment Utilized During Treatment: Rolling walker Nurse Communication: Mobility status  Activity Tolerance: Patient tolerated treatment well Patient left: in bed;with call bell/phone within reach;with bed alarm set  OT Visit Diagnosis: Unsteadiness on feet (R26.81);Muscle weakness (generalized) (M62.81);History of falling (Z91.81)                Time: 6629-4765 OT Time Calculation (min): 18 min Charges:  OT General Charges $OT Visit: 1 Visit OT Evaluation $OT Eval Moderate Complexity: 1 Mod G-Codes:     Doristine Section, MS OTR/L  Pager: [phone]   Haleemah Buckalew A Tamiya Colello 12/30/2016, 4:07 PM

## 2016-12-30 NOTE — Progress Notes (Signed)
CCMD called Rn stating pt had a 5 beat run of Vtach, pts BP 159/60 HR 77 PT sitting and eating dinner in bed no c/o chest pain , SOB MD Made aware

## 2016-12-30 NOTE — Progress Notes (Signed)
Pharmacy Antibiotic Note  Joseph Watts is a 83 y.o. male admitted on 12/27/2016 with cellulitis.  Pharmacy has been consulted for Vancomycin/Zosyn dosing.  Cellulitis in R-forearm/hand.  WBC wnl and remains afebrile.    Plan: -Vancomycin 1000 mg IV q24h -Zosyn 3.375G IV q8h to be infused over 4 hours -Trend WBC, temp, renal function  - Vanc level 9/3 before 4th dose @ 2130  Height: 6\' 1"  (185.4 cm) Weight: 163 lb (73.9 kg) IBW/kg (Calculated) : 79.9  Temp (24hrs), Avg:98.3 F (36.8 C), Min:97.5 F (36.4 C), Max:99.1 F (37.3 C)   Recent Labs Lab 12/27/16 1904 12/27/16 1930 12/28/16 0543 12/29/16 0608 12/30/16 0308  WBC 8.5  --  8.2 7.1 5.9  CREATININE 1.28*  --  1.27* 1.44* 1.34*  LATICACIDVEN  --  1.65  --   --   --     Estimated Creatinine Clearance: 39.8 mL/min (A) (by C-G formula based on SCr of 1.34 mg/dL (H)).    Allergies  Allergen Reactions  . Motrin [Ibuprofen] Other (See Comments)    Instructed by MD not to take due to Warfarin     Antimicrobials this admission: Vanc 8/31 >  Zosyn 9/1>   Dose adjustments this admission: n/a  Microbiology results: Urine cx 8/31 > multiple species - recollect Blood cx x2 8/31 > ngtd  Daylene Posey, PharmD Pharmacy Resident Pager #: [phone] 12/30/2016 9:21 AM  12/30/2016 9:18 AM

## 2016-12-30 NOTE — Progress Notes (Signed)
CSW received consult regarding PT recommendation of SNF at discharge.  Patient is refusing SNF at this time. He reports that his wife was at [facility] before and they both hated it. He states he would rather go home with his wife. He promises CSW to do his exercises at home and gave permission to speak with his daughter. Patient's daughter stated that she would rather him go to SNF, but that she understands the patient and her mother want patient to return home. She has asked for [facility] services to include a Child psychotherapist that can place patient from home should the need arise. Patient has used [facility] in the past and has liked their services.   CSW signing off.  Osborne Casco Onie Kasparek LCSWA [phone]

## 2016-12-30 NOTE — Progress Notes (Signed)
PROGRESS NOTE  [name]  SWF:[id number] DOB: 09/15/1926 DOA: 12/27/2016 PCP: Alysia Penna, MD  Outpatient Specialists: Cardiology, Anne Fu Brief Narrative: Joseph Watts is a 83 y.o. male with a history of NICM s/p TAVR, AFib and complete heart block on coumadin s/p ICD and BiV pacer, pancytopenia, and stage III CKD who was brought to the ED 8/31 after a fall at home. He had a couple days of worsening confusion, weakness, and had an unwitnessed fall. In the ED he was febrile, found to have right hand/arm cellulitis, started on IV antibiotics, and admitted. Hand surgery evaluated the patient recommending antibiotics without the need for operative intervention. Erythema has improved and altered mentation has resolved. He continues to be weak, with PT recommending SNF.   Assessment & Plan: Principal Problem:   Cellulitis of right hand Active Problems:   Chronic systolic heart failure (HCC)   Hx of CABG   Aortic valve replaced   Permanent atrial fibrillation (HCC)   Essential hypertension   Cellulitis   Fall   Pancytopenia (HCC)  Right hand cellulitis: without osteomyelitis/septic joint.  - Continue antibiotics, transition to keflex 9/3.  - No surgery per hand surgery, Dr. Amanda Pea. No need for brace. - Monitoring cultures  Acute toxic metabolic encephalopathy: Due to infection, resolved.   Fall at home: Due to weakness from acute illness. continues to be debilitated due to age in setting of illness. PT recommending SNF.  - CSW consulted for SNF placement. Daughter lives next door, but cannot provide 24hr supervision and wife with whom he lives is severely debilitated as well. Discharge to home with home health would not be a safe discharge for him. Pt in reluctant agreement.  NICM, permanent AFib, complete heart block, s/p bovine TAVR: V-paced rhythm  - Continue coumadin per pharmacy, redosing daily.  - Continue coumadin - Monitor I/O, weights. Lasix prn weight gain (not needed  during hospitalization thus far)  Stage III CKD: Cr at baseline.  - Monitor, especially with abx.   Pancytopenia: Chronic, stable.  - Monitor. Platelets stable, not on heparin.   DVT prophylaxis: Coumadin Code Status: Full Family Communication: Daughter at bedside Disposition Plan: SNF in next 24 hours. Stable for discharge, insurance office is not open today due to holiday.   Consultants:   Orthopedics, Dr. Amanda Pea  Procedures:   None  Antimicrobials:  Vancomycin 8/31 > 9/3  Zosyn 9/1 > 9/3  Keflex 9/3 >>   Subjective: Redness improved dramatically. No pain or fever. Eating better now. Daughter and pt report he remains more weak than premorbid baseline. Daughter has concerns about his safety if discharged home. PT recommended SNF and pt reluctantly agrees.  Objective: BP (!) 165/70 (BP Location: Right Arm)   Pulse 77   Temp 99.1 F (37.3 C) (Oral)   Resp 18   Ht 6\' 1"  (1.854 m)   Wt 73.9 kg (163 lb)   SpO2 99%   BMI 21.51 kg/m   Gen: Pleasant elderly male in no distress resting quietly Pulm: Non-labored breathing room air. Clear to auscultation bilaterally.  CV: Regular rate and rhythm. No murmur, rub, or gallop. No JVD, trace pedal edema. GI: Abdomen soft, non-tender, non-distended, with normoactive bowel sounds. No organomegaly or masses felt. GU: Condom cath in place.  Ext: Warm, no deformities. Cap refill brisk throughout.  Skin: Poorly defined splotchy, dull erythema on dorsal distal right forearm has improved, receded from demarcation. No fluctuance or induration. Full AROM in hand, wrist, fingers.  Neuro: Alert and oriented. Diffusely  weak. No focal neurological deficits. Psych: Judgement and insight appear intact. Mood & affect appropriate.   Data Reviewed: I have personally reviewed following labs and imaging studies  CBC:  Recent Labs Lab 12/27/16 1904 12/28/16 0543 12/29/16 0608 12/30/16 0308  WBC 8.5 8.2 7.1 5.9  NEUTROABS 6.4  --   --   --    HGB 8.2* 8.3* 8.6* 8.4*  HCT 26.0* 25.9* 26.3* 25.9*  MCV 102.0* 100.8* 99.2 100.0  PLT 100* 81* 88* 85*   Basic Metabolic Panel:  Recent Labs Lab 12/27/16 1904 12/28/16 0543 12/29/16 0608 12/30/16 0308  NA 135 137 137 137  K 5.1 4.8 4.3 3.8  CL 107 110 109 110  CO2 20* 20* 23 20*  GLUCOSE 104* 137* 125* 135*  BUN 28* 32* 38* 33*  CREATININE 1.28* 1.27* 1.44* 1.34*  CALCIUM 8.6* 8.2* 8.2* 8.1*   GFR: Estimated Creatinine Clearance: 39.8 mL/min (A) (by C-G formula based on SCr of 1.34 mg/dL (H)). Liver Function Tests:  Recent Labs Lab 12/27/16 1904  AST 50*  ALT 29  ALKPHOS 104  BILITOT 0.9  PROT 6.4*  ALBUMIN 3.3*   Coagulation Profile:  Recent Labs Lab 12/27/16 1904 12/29/16 0608 12/30/16 0308  INR 2.03 2.63 2.66   Urine analysis:    Component Value Date/Time   COLORURINE YELLOW 12/27/2016 2255   APPEARANCEUR CLEAR 12/27/2016 2255   LABSPEC 1.015 12/27/2016 2255   PHURINE 6.0 12/27/2016 2255   GLUCOSEU NEGATIVE 12/27/2016 2255   HGBUR NEGATIVE 12/27/2016 2255   BILIRUBINUR NEGATIVE 12/27/2016 2255   KETONESUR NEGATIVE 12/27/2016 2255   PROTEINUR NEGATIVE 12/27/2016 2255   UROBILINOGEN 0.2 08/26/2010 1247   NITRITE NEGATIVE 12/27/2016 2255   LEUKOCYTESUR NEGATIVE 12/27/2016 2255   Recent Results (from the past 240 hour(s))  Culture, blood (Routine x 2)     Status: None (Preliminary result)   Collection Time: 12/27/16  7:04 PM  Result Value Ref Range Status   Specimen Description BLOOD LEFT FOREARM  Final   Special Requests   Final    BOTTLES DRAWN AEROBIC AND ANAEROBIC Blood Culture adequate volume   Culture NO GROWTH 3 DAYS  Final   Report Status PENDING  Incomplete  Culture, blood (Routine x 2)     Status: None (Preliminary result)   Collection Time: 12/27/16  7:35 PM  Result Value Ref Range Status   Specimen Description BLOOD RIGHT ANTECUBITAL  Final   Special Requests   Final    BOTTLES DRAWN AEROBIC AND ANAEROBIC Blood Culture adequate  volume   Culture NO GROWTH 3 DAYS  Final   Report Status PENDING  Incomplete  Urine culture     Status: Abnormal   Collection Time: 12/27/16 10:55 PM  Result Value Ref Range Status   Specimen Description URINE, CLEAN CATCH  Final   Special Requests NONE  Final   Culture MULTIPLE SPECIES PRESENT, SUGGEST RECOLLECTION (A)  Final   Report Status 12/29/2016 FINAL  Final      Radiology Studies: No results found.  Scheduled Meds: . carvedilol  25 mg Oral BID WC  . dorzolamide-timolol  1 drop Both Eyes BID  . feeding supplement (GLUCERNA SHAKE)  237 mL Oral TID BM  . ferrous sulfate  325 mg Oral Q breakfast  . finasteride  5 mg Oral Daily  . fluticasone  2 spray Each Nare Daily  . latanoprost  1 drop Both Eyes QHS  . loratadine  10 mg Oral Daily  . mirtazapine  15 mg  Oral QHS  . tamsulosin  0.4 mg Oral QHS  . warfarin  3 mg Oral Q M,W,F,Su-1800  . warfarin  4 mg Oral Q T,Th,Sat-1800  . Warfarin - Pharmacist Dosing Inpatient   Does not apply q1800   Continuous Infusions: . piperacillin-tazobactam (ZOSYN)  IV 3.375 g (12/30/16 1402)  . vancomycin Stopped (12/29/16 2258)     LOS: 3 days   Time spent: 25 minutes.  Hazeline Junker, MD [facility] Pager [phone]  If 7PM-7AM, please contact night-coverage [email or web address] Password [email or web address] 12/30/2016, 2:24 PM

## 2016-12-30 NOTE — NC FL2 (Signed)
[place] MEDICAID FL2 LEVEL OF CARE SCREENING TOOL     IDENTIFICATION  Patient Name: Joseph Watts Birthdate: 27-Aug-1926 Sex: male Admission Date (Current Location): 12/27/2016  [facility] and [place] Number:  Producer, television/film/video and Address:  [facility], [address], [place], [place] [address]      Provider Number: [id number]  Attending Physician Name and Address:  Tyrone Nine, MD  Relative Name and Phone Number:       Current Level of Care: Hospital Recommended Level of Care: Skilled Nursing Facility Prior Approval Number:    Date Approved/Denied:   PASRR Number: [id number]  Discharge Plan: SNF    Current Diagnoses: Patient Active Problem List   Diagnosis Date Noted  . Cellulitis of right hand 12/28/2016  . Fall 12/28/2016  . Pancytopenia (HCC) 12/28/2016  . Cellulitis 12/27/2016  . Complete heart block (HCC) 07/13/2015  . Permanent atrial fibrillation (HCC) 07/13/2015  . Essential hypertension 07/13/2015  . Anemia of other chronic disease 12/01/2012  . Hypercholesteremia   . Aortic valve replaced   . Chronic systolic heart failure (HCC) 08/26/2010  . Aortic insufficiency 08/26/2010  . Diabetes mellitus 08/26/2010  . CKD (chronic kidney disease) 08/26/2010  . Glaucoma 08/26/2010  . Status post biventricular cardiac pacemaker procedure 08/26/2010  . Hx of CABG 08/26/2010    Orientation RESPIRATION BLADDER Height & Weight     Self, Time, Situation, Place  Normal Incontinent, External catheter Weight: 73.9 kg (163 lb) Height:  6\' 1"  (185.4 cm)  BEHAVIORAL SYMPTOMS/MOOD NEUROLOGICAL BOWEL NUTRITION STATUS      Incontinent Diet (Please see DC Summary)  AMBULATORY STATUS COMMUNICATION OF NEEDS Skin   Limited Assist Verbally Normal                       Personal Care Assistance Level of Assistance  Bathing, Feeding, Dressing Bathing Assistance: Limited assistance Feeding assistance: Independent Dressing Assistance:  Limited assistance     Functional Limitations Info             SPECIAL CARE FACTORS FREQUENCY  PT (By licensed PT)     PT Frequency: 5x/week              Contractures      Additional Factors Info  Code Status, Allergies Code Status Info: Full Allergies Info: Motrin Ibuprofen           Current Medications (12/30/2016):  This is the current hospital active medication list Current Facility-Administered Medications  Medication Dose Route Frequency Provider Last Rate Last Dose  . acetaminophen (TYLENOL) tablet 650 mg  650 mg Oral Q6H PRN Eduard Clos, MD   650 mg at 12/28/16 2243   Or  . acetaminophen (TYLENOL) suppository 650 mg  650 mg Rectal Q6H PRN Eduard Clos, MD      . carvedilol (COREG) tablet 25 mg  25 mg Oral BID WC Eduard Clos, MD   25 mg at 12/30/16 0930  . dorzolamide-timolol (COSOPT) 22.3-6.8 MG/ML ophthalmic solution 1 drop  1 drop Both Eyes BID Eduard Clos, MD   1 drop at 12/30/16 1021  . feeding supplement (GLUCERNA SHAKE) (GLUCERNA SHAKE) liquid 237 mL  237 mL Oral TID BM Leroy Sea, MD   237 mL at 12/30/16 0930  . ferrous sulfate tablet 325 mg  325 mg Oral Q breakfast Eduard Clos, MD   325 mg at 12/30/16 7846  . finasteride (PROSCAR) tablet 5 mg  5 mg Oral Daily Eduard Clos, MD   5 mg at 12/30/16 0930  . fluocinonide ointment (LIDEX) 0.05 % 1 application  1 application Topical Daily PRN Eduard Clos, MD      . fluticasone [facility]) 50 MCG/ACT nasal spray 2 spray  2 spray Each Nare Daily Eduard Clos, MD   2 spray at 12/30/16 1021  . latanoprost (XALATAN) 0.005 % ophthalmic solution 1 drop  1 drop Both Eyes QHS Eduard Clos, MD   1 drop at 12/29/16 2157  . loratadine (CLARITIN) tablet 10 mg  10 mg Oral Daily Eduard Clos, MD   10 mg at 12/30/16 0930  . mirtazapine (REMERON) tablet 15 mg  15 mg Oral QHS Eduard Clos, MD   15 mg at 12/29/16 2158  . ondansetron  (ZOFRAN) tablet 4 mg  4 mg Oral Q6H PRN Eduard Clos, MD       Or  . ondansetron [facility]) injection 4 mg  4 mg Intravenous Q6H PRN Eduard Clos, MD      . piperacillin-tazobactam (ZOSYN) IVPB 3.375 g  3.375 g Intravenous Q8H Mykhael, Swasey, RPH   Stopped at 12/30/16 0915  . tamsulosin (FLOMAX) capsule 0.4 mg  0.4 mg Oral QHS Eduard Clos, MD   0.4 mg at 12/29/16 2158  . triamcinolone cream (KENALOG) 0.1 % 1 application  1 application Topical PRN Eduard Clos, MD      . vancomycin (VANCOCIN) IVPB 1000 mg/200 mL premix  1,000 mg Intravenous Q24H Quaveon, Zavada, [facility]   Stopped at 12/29/16 2258  . warfarin (COUMADIN) tablet 3 mg  3 mg Oral Q M,W,F,Su-1800 Sampson, Lyndaker, RPH   3 mg at 12/29/16 1655  . warfarin (COUMADIN) tablet 4 mg  4 mg Oral Q T,Th,Sat-1800 Chrishun, Kimura, RPH   4 mg at 12/28/16 1833  . Warfarin - Pharmacist Dosing Inpatient   Does not apply q1800 Darris, Bipp[facility]         Discharge Medications: Please see discharge summary for a list of discharge medications.  Relevant Imaging Results:  Relevant Lab Results:   Additional Information SSN: [id number] [place], [place]

## 2016-12-30 NOTE — Progress Notes (Signed)
ANTICOAGULATION CONSULT NOTE - Follow Up Consult  Pharmacy Consult for warfarin Indication: atrial fibrillation  Allergies  Allergen Reactions  . Motrin [Ibuprofen] Other (See Comments)    Instructed by MD not to take due to Warfarin     Patient Measurements: Height: 6\' 1"  (185.4 cm) Weight: 163 lb (73.9 kg) IBW/kg (Calculated) : 79.9  Vital Signs: Temp: 99.1 F (37.3 C) (09/03 0653) Temp Source: Oral (09/03 0653) BP: 165/70 (09/03 0653) Pulse Rate: 77 (09/03 0653)  Labs:  Recent Labs  12/27/16 1904 12/28/16 0543 12/29/16 1829 12/30/16 0308  HGB 8.2* 8.3* 8.6* 8.4*  HCT 26.0* 25.9* 26.3* 25.9*  PLT 100* 81* Joseph* 85*  LABPROT 22.8*  --  27.9* 28.1*  INR 2.03  --  2.63 2.66  CREATININE 1.28* 1.27* 1.44* 1.34*    Estimated Creatinine Clearance: 39.8 mL/min (A) (by C-G formula based on SCr of 1.34 mg/dL (H)).   Medications:  Scheduled:  . carvedilol  25 mg Oral BID WC  . dorzolamide-timolol  1 drop Both Eyes BID  . feeding supplement (GLUCERNA SHAKE)  237 mL Oral TID BM  . ferrous sulfate  325 mg Oral Q breakfast  . finasteride  5 mg Oral Daily  . fluticasone  2 spray Each Nare Daily  . latanoprost  1 drop Both Eyes QHS  . loratadine  10 mg Oral Daily  . mirtazapine  15 mg Oral QHS  . tamsulosin  0.4 mg Oral QHS  . warfarin  3 mg Oral Q M,W,F,Su-1800  . warfarin  4 mg Oral Q T,Th,Sat-1800  . Warfarin - Pharmacist Dosing Inpatient   Does not apply q1800    Assessment: Joseph Watts on warfarin for afib, presenting with cellulitis, INR 2.03 on admit (therapeutic), CBC low but stable likely d/t anemia of CKD with no bleeding noted at this time.  INR 2.66 - therapeutic 9/3  Goal of Therapy:  INR 2-3 Monitor platelets by anticoagulation protocol: Yes   Plan:  Continue warfarin per home regimen (4mg  TTS, 3mg  all other days) Monitor for s/s bleeding Daily INR, CBC  Daylene Posey, PharmD Pharmacy Resident Pager #: [phone] 12/30/2016 9:15 AM

## 2016-12-30 NOTE — Clinical Social Work Note (Signed)
Clinical Social Work Assessment  Patient Details  Name: Joseph Watts MRN: [id number] Date of Birth: 1926/12/09  Date of referral:  12/30/16               Reason for consult:  Facility Placement                Permission sought to share information with:  Facility Medical sales representative, Family Supports Permission granted to share information::  Yes, Verbal Permission Granted  Name::     Banker::  SNFs  Relationship::  Daughter  Contact Information:  [phone]  Housing/Transportation Living arrangements for the past 2 months:  Single Family Home Source of Information:  Patient, Adult Children Patient Interpreter Needed:  None Criminal Activity/Legal Involvement Pertinent to Current Situation/Hospitalization:  No - Comment as needed Significant Relationships:  Adult Children, Spouse Lives with:  Spouse Do you feel safe going back to the place where you live?  Yes Need for family participation in patient care:  No (Coment)  Care giving concerns:  CSW received consult for possible SNF placement at time of discharge. CSW spoke with patient regarding PT recommendation of SNF placement at time of discharge. Patient spoke with the MD and his daughter and is now acceptable to SNF placement. CSW to continue to follow and assist with discharge planning needs.   Social Worker assessment / plan:  CSW spoke with patient concerning possibility of rehab at [facility] before returning home.  Employment status:  Retired Database administrator PT Recommendations:  Skilled Nursing Facility Information / Referral to community resources:  Skilled Nursing Facility  Patient/Family's Response to care:  Patient recognizes need for rehab before returning home and is agreeable to a SNF in [place]. Patient reported preference for [facility] since his wife has gone there before. CSW explained need for insurance authorization before patient goes to SNF.   Patient/Family's  Understanding of and Emotional Response to Diagnosis, Current Treatment, and Prognosis:  Patient/family is realistic regarding therapy needs and expressed being hopeful for SNF placement. Patient expressed understanding of CSW role and discharge process as well as medical condition. No questions/concerns about plan or treatment.    Emotional Assessment Appearance:  Appears stated age Attitude/Demeanor/Rapport:  Other (Appropriate) Affect (typically observed):  Accepting, Appropriate Orientation:  Oriented to Self, Oriented to Situation, Oriented to Place, Oriented to  Time Alcohol / Substance use:  Not Applicable Psych involvement (Current and /or in the community):  No (Comment)  Discharge Needs  Concerns to be addressed:  Care Coordination Readmission within the last 30 days:  No Current discharge risk:  None Barriers to Discharge:  Continued Medical Work up   [employer], LCSWA 12/30/2016, 11:55 AM

## 2016-12-31 LAB — CBC
HEMATOCRIT: 25.9 % — AB (ref 39.0–52.0)
Hemoglobin: 8.3 g/dL — ABNORMAL LOW (ref 13.0–17.0)
MCH: 32.2 pg (ref 26.0–34.0)
MCHC: 32 g/dL (ref 30.0–36.0)
MCV: 100.4 fL — ABNORMAL HIGH (ref 78.0–100.0)
Platelets: 100 10*3/uL — ABNORMAL LOW (ref 150–400)
RBC: 2.58 MIL/uL — ABNORMAL LOW (ref 4.22–5.81)
RDW: 13.2 % (ref 11.5–15.5)
WBC: 4.6 10*3/uL (ref 4.0–10.5)

## 2016-12-31 LAB — PROTIME-INR
INR: 3.23
Prothrombin Time: 32.7 seconds — ABNORMAL HIGH (ref 11.4–15.2)

## 2016-12-31 MED ORDER — CEPHALEXIN 500 MG PO CAPS: 500.0000 mg | ORAL_CAPSULE | Freq: Two times a day (BID) | ORAL | 0 refills | Status: DC

## 2016-12-31 MED ORDER — FUROSEMIDE 20 MG PO TABS: 20.0000 mg | ORAL_TABLET | Freq: Every day | ORAL | Status: AC | PRN

## 2016-12-31 MED ORDER — CEPHALEXIN 500 MG PO CAPS: 500.0000 mg | ORAL_CAPSULE | Freq: Two times a day (BID) | ORAL | 0 refills | Status: AC

## 2016-12-31 NOTE — Care Management Note (Signed)
Case Management Note  Patient Details  Name: Joseph Watts MRN: [id number] Date of Birth: [birth year]  Subjective/Objective:           Spoke w patient at the bedside. Confirmed he declined SNF. He would like to DC to home and [facility]. Referral made to [facility], patient familiar to practice. CM requested orders from Dr Jarvis Newcomer for [facility].          Action/Plan:  DC to home w HH.  Expected Discharge Date:  12/31/16               Expected Discharge Plan:  Home w Home Health Services  In-House Referral:  Clinical Social Work  Discharge planning Services  CM Consult  Post Acute Care Choice:  Home Health Choice offered to:  Patient  DME Arranged:    DME Agency:     HH Arranged:  RN, PT, OT, Nurse's Aide HH Agency:  [facility] (now [facility])  Status of Service:  Completed, signed off  If discussed at [employer] of Stay Meetings, dates discussed:    Additional Comments:  Lawerance Sabal, RN 12/31/2016, 2:48 PM

## 2016-12-31 NOTE — Discharge Summary (Addendum)
Physician Discharge Summary  Mahesh Severs UXL:[id number] DOB: 1926-11-14 DOA: 12/27/2016  PCP: Alysia Penna, MD  Admit date: 12/27/2016 Discharge date: 12/31/2016  Admitted From: Home Disposition: Home   Recommendations for Outpatient Follow-up:  1. Follow up with PCP in 1-2 weeks 2. Please obtain BMP/CBC in one week  Home Health: PT (pt declined SNF) Equipment/Devices: N/A Discharge Condition: Stable CODE STATUS: Full Diet recommendation: Heart healthy  Brief/Interim Summary: Joseph Watts is a 83 y.o. male with a history of NICM s/p TAVR, AFib and complete heart block on coumadin s/p ICD and BiV pacer, pancytopenia, and stage III CKD who was brought to the ED 8/31 after a fall at home. He had a couple days of worsening confusion, weakness, and had an unwitnessed fall. In the ED he was febrile, found to have right hand/arm cellulitis, started on IV antibiotics, and admitted. Hand surgery evaluated the patient recommending antibiotics without the need for operative intervention. Erythema has improved and altered mentation has resolved. His weakness improved, though he still required assistance with ambulation and was advised that SNF would be the safest discharge for him. After initial agreement, he ambulated so well on 9/4, he and his daughter declined SNF discharge and will go home with home health.   Discharge Diagnoses:  Principal Problem:   Cellulitis of right hand Active Problems:   Chronic systolic heart failure (HCC)   Hx of CABG   Aortic valve replaced   Permanent atrial fibrillation (HCC)   Essential hypertension   Cellulitis   Fall   Pancytopenia (HCC)  Right hand cellulitis: without osteomyelitis/septic joint.  - Continue antibiotics (started zosyn 9/1), transitioned to keflex 9/3, final dose of 7-day course will by 9/7 pm. - No surgery per hand surgery, Dr. Amanda Pea. No need for brace. - Monitoring cultures, NGTD  Acute toxic metabolic encephalopathy: Due to  infection, resolved.   Fall at home: Due to weakness from acute illness. continues to be debilitated due to age in setting of illness.  - CSW consulted for SNF placement. Daughter lives next door, but cannot provide 24hr supervision and wife with whom he lives is severely debilitated as well. Discharge to home with home health per pt and family preference. SNF was recommended and declined.   NICM, permanent AFib, complete heart block, s/p bovine TAVR: V-paced rhythm  - Continue coumadin (4mg  TTS, 3mg  MWF and Sun) - Lasix prn weight gain (not needed during hospitalization thus far)  Stage III CKD: Cr at baseline.  - Monitor in 1 week.  Pancytopenia: Chronic, stable. DC labs as below. - Monitor in 1 week. Platelets stable, not on heparin.   Discharge Instructions Discharge Instructions    Diet - low sodium heart healthy    Complete by:  As directed      Allergies as of 12/31/2016      Reactions   Motrin [ibuprofen] Other (See Comments)   Instructed by MD not to take due to Warfarin       Medication List    TAKE these medications   carvedilol 25 MG tablet Commonly known as:  COREG Take 25 mg by mouth 2 (two) times daily with a meal.   cephALEXin 500 MG capsule Commonly known as:  KEFLEX Take 1 capsule (500 mg total) by mouth every 12 (twelve) hours.   cetirizine 10 MG tablet Commonly known as:  ZYRTEC Take 10 mg by mouth daily.   dorzolamide-timolol 22.3-6.8 MG/ML ophthalmic solution Commonly known as:  COSOPT Place 1 drop into both eyes  2 (two) times daily.   ferrous sulfate 325 (65 FE) MG tablet Take 162.5 mg by mouth daily with breakfast.   finasteride 5 MG tablet Commonly known as:  PROSCAR Take 5 mg by mouth daily.   fluocinonide ointment 0.05 % Commonly known as:  LIDEX Apply 1 application topically daily as needed (skin care).   fluticasone 50 MCG/ACT nasal spray Commonly known as:  FLONASE Place 2 sprays into the nose as needed for allergies.    furosemide 20 MG tablet Commonly known as:  LASIX Take 1 tablet (20 mg total) by mouth daily as needed for fluid.   latanoprost 0.005 % ophthalmic solution Commonly known as:  XALATAN Place 1 drop into both eyes at bedtime.   mirtazapine 15 MG tablet Commonly known as:  REMERON Take 15 mg by mouth at bedtime.   tamsulosin 0.4 MG Caps capsule Commonly known as:  FLOMAX Take 0.4 mg by mouth at bedtime.   triamcinolone cream 0.1 % Commonly known as:  KENALOG Apply 1 application topically as needed (itching).   VITAMIN K PO Take 100 mcg by mouth daily.   warfarin 4 MG tablet Commonly known as:  COUMADIN Take 3-4 mg by mouth every morning. Tuesday, thursday Saturday 4 mg. All other days 3 mg            Discharge Care Instructions        Start     Ordered   12/31/16 0000  furosemide (LASIX) 20 MG tablet  Daily PRN     12/31/16 1118   12/31/16 0000  Diet - low sodium heart healthy     12/31/16 1118   12/31/16 0000  cephALEXin (KEFLEX) 500 MG capsule  Every 12 hours    Comments:  to complete course started in hospital   12/31/16 1353     Follow-up Information    Alysia Penna, MD Follow up.   Specialty:  Internal Medicine Why:  1 - 2weeks following SNF discharge. Contact information: [address] [place] [address] [phone]          Allergies  Allergen Reactions  . Motrin [Ibuprofen] Other (See Comments)    Instructed by MD not to take due to Warfarin     Consultations:  None  Procedures/Studies: Dg Chest 2 View  Result Date: 12/27/2016 CLINICAL DATA:  Larey Seat and hit head EXAM: CHEST  2 VIEW COMPARISON:  10/30/2012 FINDINGS: Post sternotomy changes and valvular prosthesis. Right-sided pacing device, similar compared to prior radiograph. Small bilateral pleural effusions. Hazy left basilar atelectasis or infiltrate. Mild cardiomegaly. Aortic atherosclerosis. Negative for pneumothorax. IMPRESSION: 1. Small pleural effusions. Hazy basilar  atelectasis or infiltrate on the left 2. Mild cardiomegaly Electronically Signed   By: Jasmine Pang M.D.   On: 12/27/2016 20:20   Dg Wrist Complete Right  Result Date: 12/27/2016 CLINICAL DATA:  Right arm swelling with redness EXAM: RIGHT WRIST - COMPLETE 3+ VIEW COMPARISON:  None. FINDINGS: Diffuse osteopenia. Widening of the scaffold lunate interval up to 6 mm. Small erosive change at the base of the fifth metacarpal and ulnar aspect of the fifth CMC joint. Lucency at the hamate bone. Prominent fibrocartilage calcification. Diffuse soft tissue swelling. Vascular calcification. No soft tissue gas. IMPRESSION: 1. Osteopenia without acute fracture 2. Soft tissue swelling. Suspicion of erosive changes at the base of the fifth metacarpal and ulnar aspect of the hamate bone, which may be secondary to osteomyelitis/infection in the appropriate clinical setting. 3. Chondrocalcinosis 4. Widened scaffold lunate interval, consistent with  ligamentous abnormality. 5. Extensive vascular calcifications. Electronically Signed   By: Jasmine Pang M.D.   On: 12/27/2016 20:17   Ct Head Wo Contrast  Result Date: 12/27/2016 CLINICAL DATA:  83 year old male with injury following fall today. Initial encounter. EXAM: CT HEAD WITHOUT CONTRAST TECHNIQUE: Contiguous axial images were obtained from the base of the skull through the vertex without intravenous contrast. COMPARISON:  04/22/2016 and prior CTs FINDINGS: Brain: No evidence of acute infarction, hemorrhage, hydrocephalus, extra-axial collection or mass lesion/mass effect. Atrophy and chronic small-vessel white matter ischemic changes are again noted. Vascular: Intracranial atherosclerotic calcifications noted. Skull: Normal. Negative for fracture or focal lesion. Sinuses/Orbits: No acute finding. Other: None IMPRESSION: No evidence of acute intracranial abnormality. Atrophy and chronic small-vessel white matter ischemic changes. Electronically Signed   By: Harmon Pier M.D.    On: 12/27/2016 19:58   Dg Hand Complete Right  Result Date: 12/27/2016 CLINICAL DATA:  Painful, swollen, red EXAM: RIGHT HAND - COMPLETE 3+ VIEW COMPARISON:  None. FINDINGS: Osteopenia. No fracture or malalignment. Mild degenerative changes at the DIP and PIP joints. Erosive change at the base of fifth metacarpal and CMC joint. IMPRESSION: 1. No fracture or malalignment 2. Erosive change at the base of fifth metacarpal/CMC joint raises suspicion for infection. Electronically Signed   By: Jasmine Pang M.D.   On: 12/27/2016 20:19   Subjective: No complaints, no pain or fever. Feels ready to go. Worked with PT today.   Discharge Exam: BP (!) 164/70 (BP Location: Right Arm)   Pulse 86   Temp 98 F (36.7 C) (Oral)   Resp 20   Ht 6\' 1"  (1.854 m)   Wt 70.8 kg (156 lb)   SpO2 100%   BMI 20.58 kg/m   General: Pt is alert, awake, not in acute distress Cardiovascular: RRR, S1/S2 +, no rubs, no gallops Respiratory: CTA bilaterally, no wheezing, no rhonchi Abdominal: Soft, NT, ND, bowel sounds + Extremities: Poorly defined splotchy, dull erythema on dorsal distal right forearm has improved, receded from demarcation. No fluctuance or induration. Full AROM in hand, wrist, fingers. Cap refill brisk.  Labs: Basic Metabolic Panel:  Recent Labs Lab 12/27/16 1904 12/28/16 0543 12/29/16 0608 12/30/16 0308  NA 135 137 137 137  K 5.1 4.8 4.3 3.8  CL 107 110 109 110  CO2 20* 20* 23 20*  GLUCOSE 104* 137* 125* 135*  BUN 28* 32* 38* 33*  CREATININE 1.28* 1.27* 1.44* 1.34*  CALCIUM 8.6* 8.2* 8.2* 8.1*   Liver Function Tests:  Recent Labs Lab 12/27/16 1904  AST 50*  ALT 29  ALKPHOS 104  BILITOT 0.9  PROT 6.4*  ALBUMIN 3.3*   CBC:  Recent Labs Lab 12/27/16 1904 12/28/16 0543 12/29/16 0608 12/30/16 0308 12/31/16 0522  WBC 8.5 8.2 7.1 5.9 4.6  NEUTROABS 6.4  --   --   --   --   HGB 8.2* 8.3* 8.6* 8.4* 8.3*  HCT 26.0* 25.9* 26.3* 25.9* 25.9*  MCV 102.0* 100.8* 99.2 100.0 100.4*   PLT 100* 81* 88* 85* 100*   Urinalysis    Component Value Date/Time   COLORURINE YELLOW 12/27/2016 2255   APPEARANCEUR CLEAR 12/27/2016 2255   LABSPEC 1.015 12/27/2016 2255   PHURINE 6.0 12/27/2016 2255   GLUCOSEU NEGATIVE 12/27/2016 2255   HGBUR NEGATIVE 12/27/2016 2255   BILIRUBINUR NEGATIVE 12/27/2016 2255   KETONESUR NEGATIVE 12/27/2016 2255   PROTEINUR NEGATIVE 12/27/2016 2255   UROBILINOGEN 0.2 08/26/2010 1247   NITRITE NEGATIVE 12/27/2016 2255  LEUKOCYTESUR NEGATIVE 12/27/2016 2255    Microbiology Recent Results (from the past 240 hour(s))  Culture, blood (Routine x 2)     Status: None (Preliminary result)   Collection Time: 12/27/16  7:04 PM  Result Value Ref Range Status   Specimen Description BLOOD LEFT FOREARM  Final   Special Requests   Final    BOTTLES DRAWN AEROBIC AND ANAEROBIC Blood Culture adequate volume   Culture NO GROWTH 3 DAYS  Final   Report Status PENDING  Incomplete  Culture, blood (Routine x 2)     Status: None (Preliminary result)   Collection Time: 12/27/16  7:35 PM  Result Value Ref Range Status   Specimen Description BLOOD RIGHT ANTECUBITAL  Final   Special Requests   Final    BOTTLES DRAWN AEROBIC AND ANAEROBIC Blood Culture adequate volume   Culture NO GROWTH 3 DAYS  Final   Report Status PENDING  Incomplete  Urine culture     Status: Abnormal   Collection Time: 12/27/16 10:55 PM  Result Value Ref Range Status   Specimen Description URINE, CLEAN CATCH  Final   Special Requests NONE  Final   Culture MULTIPLE SPECIES PRESENT, SUGGEST RECOLLECTION (A)  Final   Report Status 12/29/2016 FINAL  Final    Time coordinating discharge: Approximately 40 minutes  Hazeline Junker, MD  [facility] 12/31/2016, 1:53 PM Pager [phone]

## 2016-12-31 NOTE — Progress Notes (Signed)
Physical Therapy Treatment Patient Details Name: Joseph Watts MRN: [id number] DOB: 22-Oct-1926 Today's Date: 12/31/2016    History of Present Illness Pt is an 83 y/o male admitted after sustaining a fall at home. Pt found to have R hand cellulitis and acute encephalopathy. PMH including but not limited to DM, HTN, HLD, s/p TAVR in 2012 and s/p pacemaker placement 2012.    PT Comments    Pt much improved over eval day.  Pt is conversant, more mobile in bed and during transitions OOB.  Pt able to progress gait today without significant assist, but pushed himself too far and fatigued to the point of having to sit for safety.   Follow Up Recommendations  SNF;Supervision/Assistance - 24 hour     Equipment Recommendations  None recommended by PT    Recommendations for Other Services       Precautions / Restrictions Precautions Precautions: Fall Restrictions Weight Bearing Restrictions: No    Mobility  Bed Mobility   Bed Mobility: Sit to Supine     Supine to sit: Supervision     General bed mobility comments: increased time, but no assist needed  Transfers Overall transfer level: Needs assistance Equipment used: Rolling walker (2 wheeled) Transfers: Sit to/from Stand Sit to Stand: Min guard         General transfer comment: min guard from most surfaces, but min from low toilet surface.  Ambulation/Gait Ambulation/Gait assistance: Min guard Ambulation Distance (Feet): 400 Feet (with 1 standing and 1 sitting rest break) Assistive device: Rolling walker (2 wheeled) Gait Pattern/deviations: Step-through pattern Gait velocity: slower Gait velocity interpretation: Below normal speed for age/gender General Gait Details: cues for postural checks and guard for when pt became fatigued from pushing to far.  Generally steady while not fatigued   Stairs            Wheelchair Mobility    Modified Rankin (Stroke Patients Only)       Balance Overall balance  assessment: Needs assistance Sitting-balance support: Feet supported;No upper extremity supported Sitting balance-Leahy Scale: Fair       Standing balance-Leahy Scale: Poor Standing balance comment: closer to being able to stand statically without assist.  Needed rails for support during pericare.                            Cognition Arousal/Alertness: Awake/alert Behavior During Therapy: WFL for tasks assessed/performed Overall Cognitive Status: Within Functional Limits for tasks assessed                             Awareness: Anticipatory Problem Solving: Slow processing        Exercises      General Comments        Pertinent Vitals/Pain Pain Assessment: No/denies pain    Home Living                      Prior Function            PT Goals (current goals can now be found in the care plan section) Acute Rehab PT Goals Patient Stated Goal: get stronger at SNF then go home PT Goal Formulation: With patient Time For Goal Achievement: 01/11/17 Potential to Achieve Goals: Fair Progress towards PT goals: Progressing toward goals    Frequency    Min 3X/week      PT Plan Current plan remains appropriate  Co-evaluation              AM-PAC PT "6 Clicks" Daily Activity  Outcome Measure  Difficulty turning over in bed (including adjusting bedclothes, sheets and blankets)?: A Little Difficulty moving from lying on back to sitting on the side of the bed? : A Little Difficulty sitting down on and standing up from a chair with arms (e.g., wheelchair, bedside commode, etc,.)?: A Little Help needed moving to and from a bed to chair (including a wheelchair)?: A Little Help needed walking in hospital room?: A Little Help needed climbing 3-5 steps with a railing? : A Little 6 Click Score: 18    End of Session   Activity Tolerance: Patient tolerated treatment well Patient left: in chair;with call bell/phone within reach;with  chair alarm set Nurse Communication: Mobility status PT Visit Diagnosis: Other abnormalities of gait and mobility (R26.89);History of falling (Z91.81);Muscle weakness (generalized) (M62.81)     Time: 2620-3559 PT Time Calculation (min) (ACUTE ONLY): 34 min  Charges:  $Gait Training: 8-22 mins $Therapeutic Activity: 8-22 mins                    G Codes:       01-16-2017  [place] Bing, PT [phone]  (pager)   Eliseo Gum Amberli Ruegg 2017-01-16, 11:51 AM

## 2016-12-31 NOTE — Care Management Important Message (Signed)
Important Message  Patient Details  Name: Joseph Watts MRN: [id number] Date of Birth: 1926/06/04   Medicare Important Message Given:  Yes    Bryn Saline Abena 12/31/2016, 11:14 AM

## 2016-12-31 NOTE — Progress Notes (Addendum)
CSW received call from patient's daughter. She is going to take patient home. CSW confirmed plan with patient. He reported just wanting to go home. Daughter will pick patient up this afternoon. RNCM alerted for home health needs.  CSW signing off.  Osborne Casco Burman Bruington LCSWA [phone]

## 2016-12-31 NOTE — Progress Notes (Signed)
Patient discharged to home with daughter around 45 with home health orders. Discharge instructions provided to patient and daughter who verbalized understanding. Skin free of breakdown at time of discharge.  Sherlon Handing, RN

## 2016-12-31 NOTE — Consult Note (Signed)
           [facility] CM Primary Care Navigator  12/31/2016  Ceola Mania [birth year] [phone]    Attempt to seepatient at the bedsideto identify possible discharge needs but he was already discharged per staff report.  Patient was discharged home with home health services. Daughter brought patient home today since skilled nursing facility per therapy recommendation was declined per Inpatient social worker note.  Patient was noted to have discharge instruction to follow-up with primary care provider in 1-2 weeks.    For questions, please contact:  Wyatt Haste, BSN, RN- [facility] Primary Care Navigator  Telephone: [phone] [facility]

## 2016-12-31 NOTE — Progress Notes (Signed)
ANTICOAGULATION CONSULT NOTE - Follow Up Consult  Pharmacy Consult for Coumadin Indication: atrial fibrillation  Allergies  Allergen Reactions  . Motrin [Ibuprofen] Other (See Comments)    Instructed by MD not to take due to Warfarin     Patient Measurements: Height: 6\' 1"  (185.4 cm) Weight: 156 lb (70.8 kg) IBW/kg (Calculated) : 79.9   Vital Signs: Temp: 98 F (36.7 C) (09/04 0541) Temp Source: Oral (09/04 0541) BP: 164/70 (09/04 0541) Pulse Rate: 86 (09/04 0541)  Labs:  Recent Labs  12/29/16 0608 12/30/16 0308 12/31/16 0522  HGB 8.6* 8.4* 8.3*  HCT 26.3* 25.9* 25.9*  PLT 88* 85* 100*  LABPROT 27.9* 28.1* 32.7*  INR 2.63 2.66 3.23  CREATININE 1.44* 1.34*  --     Estimated Creatinine Clearance: 38.2 mL/min (A) (by C-G formula based on SCr of 1.34 mg/dL (H)).  Assessment:  Anticoag: warfarin PTA for afib. INR: 2.66> 3.23 today. CBC relatively stable  Goal of Therapy:  INR 2-3 Monitor platelets by anticoagulation protocol: Yes   Plan:  Hold Coumadin due to elevated in INR today.  Burrel Legrand S. Merilynn Finland, PharmD, BCPS Clinical Staff Pharmacist Pager [phone]  Misty Stanley Stillinger 12/31/2016,10:23 AM

## 2017-01-01 LAB — CULTURE, BLOOD (ROUTINE X 2)
CULTURE: NO GROWTH
CULTURE: NO GROWTH
Special Requests: ADEQUATE
Special Requests: ADEQUATE

## 2017-01-02 ENCOUNTER — Encounter: Payer: Self-pay | Admitting: Cardiology

## 2017-01-02 ENCOUNTER — Ambulatory Visit (INDEPENDENT_AMBULATORY_CARE_PROVIDER_SITE_OTHER): Payer: Medicare HMO

## 2017-01-02 DIAGNOSIS — I5022 Chronic systolic (congestive) heart failure: Secondary | ICD-10-CM

## 2017-01-02 DIAGNOSIS — Z950 Presence of cardiac pacemaker: Secondary | ICD-10-CM

## 2017-01-02 NOTE — Progress Notes (Signed)
EPIC Encounter for ICM Monitoring  Patient Name: Joseph Watts is a 83 y.o. male Date: 01/02/2017 Primary Care Physican: Velna Hatchet, MD Primary Cardiologist: Marlou Porch Electrophysiologist: Allred Dry Weight:Does not weigh Bi-V Pacing: 99.9%       Spoke with daughter Waynard Reeds.  Heart Failure questions reviewed, pt asymptomatic with leg swelling but unsure if leg were swollen at time of discharge.  He has not received any PRN Furosemide since discharge   Thoracic impedance abnormal suggesting varying degrees of fluid accumulation since 12/24/2016.  Prescribed dosage: Furosemide 20 mg 1 tablet daily as needed for swelling.   Labs: 12/30/2016 Creatinine 1.34, BUN 33, Potassium 3.8, Sodium 137, EGFR 46-53 12/29/2016 Creatinine 1.44, BUN 38, Potassium 4.3, Sodium 137, EGFR 42-48  12/28/2016 Creatinine 1.27, BUN 32, Potassium 4.8, Sodium 137, EGFR 49-56  12/27/2016 Creatinine 1.28, BUN 28, Potassium 5.1, Sodium 135, EGFR 48-56   Recommendations:  Advised to give PRN Furosemide 1 tablet a day up to the next 3-4 days.  Advised return to PRN dosage if swelling goes down before 4th day.  She verbalized understanding  Follow-up plan: ICM clinic phone appointment on 01/06/2017 to recheck fluid levels.    Copy of ICM check sent to Dr. Marlou Porch and Dr. Rayann Heman for review and will call her if any recommendations.   3 month ICM trend: 01/02/2017   1 Year ICM trend:      Rosalene Billings, RN 01/02/2017 12:09 PM

## 2017-01-03 DIAGNOSIS — E1122 Type 2 diabetes mellitus with diabetic chronic kidney disease: Secondary | ICD-10-CM | POA: Diagnosis not present

## 2017-01-03 DIAGNOSIS — E784 Other hyperlipidemia: Secondary | ICD-10-CM | POA: Diagnosis not present

## 2017-01-03 DIAGNOSIS — I130 Hypertensive heart and chronic kidney disease with heart failure and stage 1 through stage 4 chronic kidney disease, or unspecified chronic kidney disease: Secondary | ICD-10-CM | POA: Diagnosis not present

## 2017-01-03 DIAGNOSIS — I482 Chronic atrial fibrillation: Secondary | ICD-10-CM | POA: Diagnosis not present

## 2017-01-03 DIAGNOSIS — I5022 Chronic systolic (congestive) heart failure: Secondary | ICD-10-CM | POA: Diagnosis not present

## 2017-01-03 DIAGNOSIS — D631 Anemia in chronic kidney disease: Secondary | ICD-10-CM | POA: Diagnosis not present

## 2017-01-03 DIAGNOSIS — H4089 Other specified glaucoma: Secondary | ICD-10-CM | POA: Diagnosis not present

## 2017-01-03 DIAGNOSIS — N189 Chronic kidney disease, unspecified: Secondary | ICD-10-CM | POA: Diagnosis not present

## 2017-01-03 DIAGNOSIS — L03113 Cellulitis of right upper limb: Secondary | ICD-10-CM | POA: Diagnosis not present

## 2017-01-06 ENCOUNTER — Ambulatory Visit (INDEPENDENT_AMBULATORY_CARE_PROVIDER_SITE_OTHER): Payer: Self-pay

## 2017-01-06 DIAGNOSIS — Z7901 Long term (current) use of anticoagulants: Secondary | ICD-10-CM | POA: Diagnosis not present

## 2017-01-06 DIAGNOSIS — Z950 Presence of cardiac pacemaker: Secondary | ICD-10-CM

## 2017-01-06 DIAGNOSIS — D61818 Other pancytopenia: Secondary | ICD-10-CM | POA: Diagnosis not present

## 2017-01-06 DIAGNOSIS — N183 Chronic kidney disease, stage 3 (moderate): Secondary | ICD-10-CM | POA: Diagnosis not present

## 2017-01-06 DIAGNOSIS — I10 Essential (primary) hypertension: Secondary | ICD-10-CM | POA: Diagnosis not present

## 2017-01-06 DIAGNOSIS — W01198A Fall on same level from slipping, tripping and stumbling with subsequent striking against other object, initial encounter: Secondary | ICD-10-CM | POA: Diagnosis not present

## 2017-01-06 DIAGNOSIS — L03113 Cellulitis of right upper limb: Secondary | ICD-10-CM | POA: Diagnosis not present

## 2017-01-06 DIAGNOSIS — I4891 Unspecified atrial fibrillation: Secondary | ICD-10-CM | POA: Diagnosis not present

## 2017-01-06 DIAGNOSIS — I482 Chronic atrial fibrillation: Secondary | ICD-10-CM | POA: Diagnosis not present

## 2017-01-06 DIAGNOSIS — G9341 Metabolic encephalopathy: Secondary | ICD-10-CM | POA: Diagnosis not present

## 2017-01-06 DIAGNOSIS — I5022 Chronic systolic (congestive) heart failure: Secondary | ICD-10-CM

## 2017-01-06 NOTE — Progress Notes (Signed)
Take lasix 40 mg PO BID for the next 3 days Donato Schultz, MD

## 2017-01-06 NOTE — Progress Notes (Signed)
EPIC Encounter for ICM Monitoring  Patient Name: Joseph Watts is a 83 y.o. male Date: 01/06/2017 Primary Care Physican: Velna Hatchet, MD Primary Cardiologist: Marlou Porch Electrophysiologist: Allred Dry Weight:150.2 lbs (does not weigh routinely) Bi-V Pacing: 99.9%        Spoke with daughter Joseph Watts.  Heart Failure questions reviewed, pt does not seem to be experiencing any fluid symptoms at this time.  He has a visit with PCP today and advised daughter to have PCP check for lower extremity swelling.    Impedance continues to be abnormal suggesting fluid accumulation after taking 3 days of PRN Furosemide 20 mg daily.  Prescribed dosage: Furosemide 20 mg 1 tablet daily as needed for swelling.   Labs: 12/30/2016 Creatinine 1.34, BUN 33, Potassium 3.8, Sodium 137, EGFR 46-53 12/29/2016 Creatinine 1.44, BUN 38, Potassium 4.3, Sodium 137, EGFR 42-48  12/28/2016 Creatinine 1.27, BUN 32, Potassium 4.8, Sodium 137, EGFR 49-56  12/27/2016 Creatinine 1.28, BUN 28, Potassium 5.1, Sodium 135, EGFR 48-56   Recommendations: No changes.  Advised if he develops fluid symptoms to take PRN Furosemide and to call if needed.  Follow-up plan: ICM clinic phone appointment on 01/16/2017 to recheck fluid levels.    Copy of ICM check sent to Dr. Marlou Porch and Dr. Rayann Heman for review and if any recommendations will call back.    3 month ICM trend: 01/06/2017   1 Year ICM trend:      Rosalene Billings, RN 01/06/2017 12:51 PM

## 2017-01-06 NOTE — Progress Notes (Signed)
Call to daughter.  Advised Dr Anne Fu ordered Furosemide 40 mg bid x 3 days.  She verbalized understanding.  Reviewed symptoms of dehydration to report.  Rescheduled next ICM transmission from 01/16/2017 to 01/09/2017

## 2017-01-07 ENCOUNTER — Encounter: Payer: Self-pay | Admitting: Cardiology

## 2017-01-07 DIAGNOSIS — L03113 Cellulitis of right upper limb: Secondary | ICD-10-CM | POA: Diagnosis not present

## 2017-01-07 DIAGNOSIS — I5022 Chronic systolic (congestive) heart failure: Secondary | ICD-10-CM | POA: Diagnosis not present

## 2017-01-07 DIAGNOSIS — D649 Anemia, unspecified: Secondary | ICD-10-CM | POA: Diagnosis not present

## 2017-01-07 DIAGNOSIS — N189 Chronic kidney disease, unspecified: Secondary | ICD-10-CM | POA: Diagnosis not present

## 2017-01-07 DIAGNOSIS — I482 Chronic atrial fibrillation: Secondary | ICD-10-CM | POA: Diagnosis not present

## 2017-01-07 DIAGNOSIS — D61818 Other pancytopenia: Secondary | ICD-10-CM | POA: Diagnosis not present

## 2017-01-07 DIAGNOSIS — E1122 Type 2 diabetes mellitus with diabetic chronic kidney disease: Secondary | ICD-10-CM | POA: Diagnosis not present

## 2017-01-07 DIAGNOSIS — I428 Other cardiomyopathies: Secondary | ICD-10-CM | POA: Diagnosis not present

## 2017-01-07 DIAGNOSIS — I130 Hypertensive heart and chronic kidney disease with heart failure and stage 1 through stage 4 chronic kidney disease, or unspecified chronic kidney disease: Secondary | ICD-10-CM | POA: Diagnosis not present

## 2017-01-08 DIAGNOSIS — L03113 Cellulitis of right upper limb: Secondary | ICD-10-CM | POA: Diagnosis not present

## 2017-01-08 DIAGNOSIS — N189 Chronic kidney disease, unspecified: Secondary | ICD-10-CM | POA: Diagnosis not present

## 2017-01-08 DIAGNOSIS — I5022 Chronic systolic (congestive) heart failure: Secondary | ICD-10-CM | POA: Diagnosis not present

## 2017-01-08 DIAGNOSIS — E1122 Type 2 diabetes mellitus with diabetic chronic kidney disease: Secondary | ICD-10-CM | POA: Diagnosis not present

## 2017-01-08 DIAGNOSIS — I130 Hypertensive heart and chronic kidney disease with heart failure and stage 1 through stage 4 chronic kidney disease, or unspecified chronic kidney disease: Secondary | ICD-10-CM | POA: Diagnosis not present

## 2017-01-08 DIAGNOSIS — I482 Chronic atrial fibrillation: Secondary | ICD-10-CM | POA: Diagnosis not present

## 2017-01-09 ENCOUNTER — Ambulatory Visit (INDEPENDENT_AMBULATORY_CARE_PROVIDER_SITE_OTHER): Payer: Self-pay

## 2017-01-09 DIAGNOSIS — E1122 Type 2 diabetes mellitus with diabetic chronic kidney disease: Secondary | ICD-10-CM | POA: Diagnosis not present

## 2017-01-09 DIAGNOSIS — N189 Chronic kidney disease, unspecified: Secondary | ICD-10-CM | POA: Diagnosis not present

## 2017-01-09 DIAGNOSIS — I5022 Chronic systolic (congestive) heart failure: Secondary | ICD-10-CM | POA: Diagnosis not present

## 2017-01-09 DIAGNOSIS — I482 Chronic atrial fibrillation: Secondary | ICD-10-CM | POA: Diagnosis not present

## 2017-01-09 DIAGNOSIS — Z950 Presence of cardiac pacemaker: Secondary | ICD-10-CM

## 2017-01-09 DIAGNOSIS — L03113 Cellulitis of right upper limb: Secondary | ICD-10-CM | POA: Diagnosis not present

## 2017-01-09 DIAGNOSIS — I130 Hypertensive heart and chronic kidney disease with heart failure and stage 1 through stage 4 chronic kidney disease, or unspecified chronic kidney disease: Secondary | ICD-10-CM | POA: Diagnosis not present

## 2017-01-09 NOTE — Progress Notes (Signed)
EPIC Encounter for ICM Monitoring  Patient Name: Joseph Watts is a 83 y.o. male Date: 01/09/2017 Primary Care Physican: Velna Hatchet, MD Primary Cardiologist: [facility] Electrophysiologist: Allred Dry Weight:149.8 lbs Bi-V Pacing: 99.8%       Spoke with daughter [name].  Heart Failure questions reviewed, pt lower extremity has resolved in one leg and has a small amount of swelling in the other leg but much improved after taking increased Furosemide dosage as instructed.  He has lost 1.5 lbs in the last 2 days.     Thoracic impedance returned to normal after taking Furosemide 40 mg bid x 3 days (will complete the last dose this evening).  Prescribed dosage: Furosemide 20 mg 1 tablet daily as needed for swelling.   Labs: 12/30/2016 Creatinine 1.34, BUN 33, Potassium 3.8, Sodium 137, EGFR 46-53 12/29/2016 Creatinine 1.44, BUN 38, Potassium 4.3, Sodium 137, EGFR 42-48  12/28/2016 Creatinine 1.27, BUN 32, Potassium 4.8, Sodium 137, EGFR 49-56  12/27/2016 Creatinine 1.28, BUN 28, Potassium 5.1, Sodium 135, EGFR 48-56   Recommendations: No changes.  Advised he has the PRN Furosemide to take if needed during the upcoming weekend.  Encouraged to call for fluid symptoms.  Follow-up plan: ICM clinic phone appointment on 02/06/2017.    Copy of ICM check sent to Dr. Marlou Porch and Dr. Rayann Heman.   3 month ICM trend: 01/09/2017   1 Year ICM trend:      Rosalene Billings, RN 01/09/2017 1:38 PM

## 2017-01-10 DIAGNOSIS — E1122 Type 2 diabetes mellitus with diabetic chronic kidney disease: Secondary | ICD-10-CM | POA: Diagnosis not present

## 2017-01-10 DIAGNOSIS — N189 Chronic kidney disease, unspecified: Secondary | ICD-10-CM | POA: Diagnosis not present

## 2017-01-10 DIAGNOSIS — I482 Chronic atrial fibrillation: Secondary | ICD-10-CM | POA: Diagnosis not present

## 2017-01-10 DIAGNOSIS — I5022 Chronic systolic (congestive) heart failure: Secondary | ICD-10-CM | POA: Diagnosis not present

## 2017-01-10 DIAGNOSIS — I130 Hypertensive heart and chronic kidney disease with heart failure and stage 1 through stage 4 chronic kidney disease, or unspecified chronic kidney disease: Secondary | ICD-10-CM | POA: Diagnosis not present

## 2017-01-10 DIAGNOSIS — L03113 Cellulitis of right upper limb: Secondary | ICD-10-CM | POA: Diagnosis not present

## 2017-01-10 NOTE — Progress Notes (Signed)
Thanks for update °Marybelle Giraldo, MD ° °

## 2017-01-13 DIAGNOSIS — I482 Chronic atrial fibrillation: Secondary | ICD-10-CM | POA: Diagnosis not present

## 2017-01-13 DIAGNOSIS — I5022 Chronic systolic (congestive) heart failure: Secondary | ICD-10-CM | POA: Diagnosis not present

## 2017-01-13 DIAGNOSIS — E1122 Type 2 diabetes mellitus with diabetic chronic kidney disease: Secondary | ICD-10-CM | POA: Diagnosis not present

## 2017-01-13 DIAGNOSIS — N189 Chronic kidney disease, unspecified: Secondary | ICD-10-CM | POA: Diagnosis not present

## 2017-01-13 DIAGNOSIS — L03113 Cellulitis of right upper limb: Secondary | ICD-10-CM | POA: Diagnosis not present

## 2017-01-13 DIAGNOSIS — I130 Hypertensive heart and chronic kidney disease with heart failure and stage 1 through stage 4 chronic kidney disease, or unspecified chronic kidney disease: Secondary | ICD-10-CM | POA: Diagnosis not present

## 2017-01-14 ENCOUNTER — Telehealth: Payer: Self-pay

## 2017-01-14 DIAGNOSIS — L03113 Cellulitis of right upper limb: Secondary | ICD-10-CM | POA: Diagnosis not present

## 2017-01-14 DIAGNOSIS — E1122 Type 2 diabetes mellitus with diabetic chronic kidney disease: Secondary | ICD-10-CM | POA: Diagnosis not present

## 2017-01-14 DIAGNOSIS — I482 Chronic atrial fibrillation: Secondary | ICD-10-CM | POA: Diagnosis not present

## 2017-01-14 DIAGNOSIS — I130 Hypertensive heart and chronic kidney disease with heart failure and stage 1 through stage 4 chronic kidney disease, or unspecified chronic kidney disease: Secondary | ICD-10-CM | POA: Diagnosis not present

## 2017-01-14 DIAGNOSIS — N189 Chronic kidney disease, unspecified: Secondary | ICD-10-CM | POA: Diagnosis not present

## 2017-01-14 DIAGNOSIS — I5022 Chronic systolic (congestive) heart failure: Secondary | ICD-10-CM | POA: Diagnosis not present

## 2017-01-14 NOTE — Telephone Encounter (Signed)
Returned call to daughter, Marton Redwood as requested by voice mail message.  She stated patient has gained 2 pounds and feet are swollen in the last 3 days.  She started PRN Furosemide today and will continue for at least the next 3 days or until symptoms improve.  Advised if symptoms worsen to call back.  She scheduled patient an appointment with Nada Boozer, NP for 02/04/2017.  Today's transmission suggest that patient had fluid about 2 days ago and is improving.

## 2017-01-15 DIAGNOSIS — E1122 Type 2 diabetes mellitus with diabetic chronic kidney disease: Secondary | ICD-10-CM | POA: Diagnosis not present

## 2017-01-15 DIAGNOSIS — I130 Hypertensive heart and chronic kidney disease with heart failure and stage 1 through stage 4 chronic kidney disease, or unspecified chronic kidney disease: Secondary | ICD-10-CM | POA: Diagnosis not present

## 2017-01-15 DIAGNOSIS — N189 Chronic kidney disease, unspecified: Secondary | ICD-10-CM | POA: Diagnosis not present

## 2017-01-15 DIAGNOSIS — I482 Chronic atrial fibrillation: Secondary | ICD-10-CM | POA: Diagnosis not present

## 2017-01-15 DIAGNOSIS — L03113 Cellulitis of right upper limb: Secondary | ICD-10-CM | POA: Diagnosis not present

## 2017-01-15 DIAGNOSIS — I5022 Chronic systolic (congestive) heart failure: Secondary | ICD-10-CM | POA: Diagnosis not present

## 2017-01-17 DIAGNOSIS — L03113 Cellulitis of right upper limb: Secondary | ICD-10-CM | POA: Diagnosis not present

## 2017-01-17 DIAGNOSIS — N189 Chronic kidney disease, unspecified: Secondary | ICD-10-CM | POA: Diagnosis not present

## 2017-01-17 DIAGNOSIS — I130 Hypertensive heart and chronic kidney disease with heart failure and stage 1 through stage 4 chronic kidney disease, or unspecified chronic kidney disease: Secondary | ICD-10-CM | POA: Diagnosis not present

## 2017-01-17 DIAGNOSIS — E1122 Type 2 diabetes mellitus with diabetic chronic kidney disease: Secondary | ICD-10-CM | POA: Diagnosis not present

## 2017-01-17 DIAGNOSIS — I5022 Chronic systolic (congestive) heart failure: Secondary | ICD-10-CM | POA: Diagnosis not present

## 2017-01-17 DIAGNOSIS — I482 Chronic atrial fibrillation: Secondary | ICD-10-CM | POA: Diagnosis not present

## 2017-01-20 DIAGNOSIS — I130 Hypertensive heart and chronic kidney disease with heart failure and stage 1 through stage 4 chronic kidney disease, or unspecified chronic kidney disease: Secondary | ICD-10-CM | POA: Diagnosis not present

## 2017-01-20 DIAGNOSIS — I482 Chronic atrial fibrillation: Secondary | ICD-10-CM | POA: Diagnosis not present

## 2017-01-20 DIAGNOSIS — L03113 Cellulitis of right upper limb: Secondary | ICD-10-CM | POA: Diagnosis not present

## 2017-01-20 DIAGNOSIS — E1122 Type 2 diabetes mellitus with diabetic chronic kidney disease: Secondary | ICD-10-CM | POA: Diagnosis not present

## 2017-01-20 DIAGNOSIS — I5022 Chronic systolic (congestive) heart failure: Secondary | ICD-10-CM | POA: Diagnosis not present

## 2017-01-20 DIAGNOSIS — N189 Chronic kidney disease, unspecified: Secondary | ICD-10-CM | POA: Diagnosis not present

## 2017-01-22 DIAGNOSIS — I130 Hypertensive heart and chronic kidney disease with heart failure and stage 1 through stage 4 chronic kidney disease, or unspecified chronic kidney disease: Secondary | ICD-10-CM | POA: Diagnosis not present

## 2017-01-22 DIAGNOSIS — I482 Chronic atrial fibrillation: Secondary | ICD-10-CM | POA: Diagnosis not present

## 2017-01-22 DIAGNOSIS — E1122 Type 2 diabetes mellitus with diabetic chronic kidney disease: Secondary | ICD-10-CM | POA: Diagnosis not present

## 2017-01-22 DIAGNOSIS — N189 Chronic kidney disease, unspecified: Secondary | ICD-10-CM | POA: Diagnosis not present

## 2017-01-22 DIAGNOSIS — L03113 Cellulitis of right upper limb: Secondary | ICD-10-CM | POA: Diagnosis not present

## 2017-01-22 DIAGNOSIS — I5022 Chronic systolic (congestive) heart failure: Secondary | ICD-10-CM | POA: Diagnosis not present

## 2017-01-27 DIAGNOSIS — N189 Chronic kidney disease, unspecified: Secondary | ICD-10-CM | POA: Diagnosis not present

## 2017-01-27 DIAGNOSIS — I482 Chronic atrial fibrillation: Secondary | ICD-10-CM | POA: Diagnosis not present

## 2017-01-27 DIAGNOSIS — L03113 Cellulitis of right upper limb: Secondary | ICD-10-CM | POA: Diagnosis not present

## 2017-01-27 DIAGNOSIS — I5022 Chronic systolic (congestive) heart failure: Secondary | ICD-10-CM | POA: Diagnosis not present

## 2017-01-27 DIAGNOSIS — I130 Hypertensive heart and chronic kidney disease with heart failure and stage 1 through stage 4 chronic kidney disease, or unspecified chronic kidney disease: Secondary | ICD-10-CM | POA: Diagnosis not present

## 2017-01-27 DIAGNOSIS — E1122 Type 2 diabetes mellitus with diabetic chronic kidney disease: Secondary | ICD-10-CM | POA: Diagnosis not present

## 2017-01-28 DIAGNOSIS — I130 Hypertensive heart and chronic kidney disease with heart failure and stage 1 through stage 4 chronic kidney disease, or unspecified chronic kidney disease: Secondary | ICD-10-CM | POA: Diagnosis not present

## 2017-01-28 DIAGNOSIS — L03113 Cellulitis of right upper limb: Secondary | ICD-10-CM | POA: Diagnosis not present

## 2017-01-28 DIAGNOSIS — E1122 Type 2 diabetes mellitus with diabetic chronic kidney disease: Secondary | ICD-10-CM | POA: Diagnosis not present

## 2017-01-28 DIAGNOSIS — I5022 Chronic systolic (congestive) heart failure: Secondary | ICD-10-CM | POA: Diagnosis not present

## 2017-01-28 DIAGNOSIS — N189 Chronic kidney disease, unspecified: Secondary | ICD-10-CM | POA: Diagnosis not present

## 2017-01-28 DIAGNOSIS — I482 Chronic atrial fibrillation: Secondary | ICD-10-CM | POA: Diagnosis not present

## 2017-01-29 DIAGNOSIS — N189 Chronic kidney disease, unspecified: Secondary | ICD-10-CM | POA: Diagnosis not present

## 2017-01-29 DIAGNOSIS — I130 Hypertensive heart and chronic kidney disease with heart failure and stage 1 through stage 4 chronic kidney disease, or unspecified chronic kidney disease: Secondary | ICD-10-CM | POA: Diagnosis not present

## 2017-01-29 DIAGNOSIS — E1122 Type 2 diabetes mellitus with diabetic chronic kidney disease: Secondary | ICD-10-CM | POA: Diagnosis not present

## 2017-01-29 DIAGNOSIS — I5022 Chronic systolic (congestive) heart failure: Secondary | ICD-10-CM | POA: Diagnosis not present

## 2017-01-29 DIAGNOSIS — I4891 Unspecified atrial fibrillation: Secondary | ICD-10-CM | POA: Diagnosis not present

## 2017-01-29 DIAGNOSIS — G9341 Metabolic encephalopathy: Secondary | ICD-10-CM | POA: Diagnosis not present

## 2017-01-29 DIAGNOSIS — L03113 Cellulitis of right upper limb: Secondary | ICD-10-CM | POA: Diagnosis not present

## 2017-01-29 DIAGNOSIS — I10 Essential (primary) hypertension: Secondary | ICD-10-CM | POA: Diagnosis not present

## 2017-01-29 DIAGNOSIS — W01198A Fall on same level from slipping, tripping and stumbling with subsequent striking against other object, initial encounter: Secondary | ICD-10-CM | POA: Diagnosis not present

## 2017-01-29 DIAGNOSIS — Z7901 Long term (current) use of anticoagulants: Secondary | ICD-10-CM | POA: Diagnosis not present

## 2017-01-29 DIAGNOSIS — N183 Chronic kidney disease, stage 3 (moderate): Secondary | ICD-10-CM | POA: Diagnosis not present

## 2017-01-29 DIAGNOSIS — D61818 Other pancytopenia: Secondary | ICD-10-CM | POA: Diagnosis not present

## 2017-01-29 DIAGNOSIS — I482 Chronic atrial fibrillation: Secondary | ICD-10-CM | POA: Diagnosis not present

## 2017-01-31 DIAGNOSIS — I130 Hypertensive heart and chronic kidney disease with heart failure and stage 1 through stage 4 chronic kidney disease, or unspecified chronic kidney disease: Secondary | ICD-10-CM | POA: Diagnosis not present

## 2017-01-31 DIAGNOSIS — I5022 Chronic systolic (congestive) heart failure: Secondary | ICD-10-CM | POA: Diagnosis not present

## 2017-01-31 DIAGNOSIS — I482 Chronic atrial fibrillation: Secondary | ICD-10-CM | POA: Diagnosis not present

## 2017-01-31 DIAGNOSIS — E1122 Type 2 diabetes mellitus with diabetic chronic kidney disease: Secondary | ICD-10-CM | POA: Diagnosis not present

## 2017-01-31 DIAGNOSIS — N189 Chronic kidney disease, unspecified: Secondary | ICD-10-CM | POA: Diagnosis not present

## 2017-01-31 DIAGNOSIS — L03113 Cellulitis of right upper limb: Secondary | ICD-10-CM | POA: Diagnosis not present

## 2017-02-04 DIAGNOSIS — E1122 Type 2 diabetes mellitus with diabetic chronic kidney disease: Secondary | ICD-10-CM | POA: Diagnosis not present

## 2017-02-04 DIAGNOSIS — L03113 Cellulitis of right upper limb: Secondary | ICD-10-CM | POA: Diagnosis not present

## 2017-02-04 DIAGNOSIS — N189 Chronic kidney disease, unspecified: Secondary | ICD-10-CM | POA: Diagnosis not present

## 2017-02-04 DIAGNOSIS — I5022 Chronic systolic (congestive) heart failure: Secondary | ICD-10-CM | POA: Diagnosis not present

## 2017-02-04 DIAGNOSIS — I130 Hypertensive heart and chronic kidney disease with heart failure and stage 1 through stage 4 chronic kidney disease, or unspecified chronic kidney disease: Secondary | ICD-10-CM | POA: Diagnosis not present

## 2017-02-04 DIAGNOSIS — I482 Chronic atrial fibrillation: Secondary | ICD-10-CM | POA: Diagnosis not present

## 2017-02-06 ENCOUNTER — Ambulatory Visit (INDEPENDENT_AMBULATORY_CARE_PROVIDER_SITE_OTHER): Payer: Medicare HMO | Admitting: *Deleted

## 2017-02-06 ENCOUNTER — Ambulatory Visit: Payer: Medicare HMO | Admitting: Cardiology

## 2017-02-06 ENCOUNTER — Telehealth: Payer: Self-pay | Admitting: Cardiology

## 2017-02-06 DIAGNOSIS — I5022 Chronic systolic (congestive) heart failure: Secondary | ICD-10-CM | POA: Diagnosis not present

## 2017-02-06 DIAGNOSIS — Z950 Presence of cardiac pacemaker: Secondary | ICD-10-CM

## 2017-02-06 DIAGNOSIS — I442 Atrioventricular block, complete: Secondary | ICD-10-CM

## 2017-02-06 NOTE — Telephone Encounter (Signed)
Confirmed remote transmission w/ pt daughter.   

## 2017-02-06 NOTE — Progress Notes (Signed)
Remote pacemaker transmission.   

## 2017-02-07 ENCOUNTER — Encounter: Payer: Self-pay | Admitting: Cardiology

## 2017-02-07 LAB — CUP PACEART REMOTE DEVICE CHECK
Brady Statistic AP VS Percent: 0 %
Brady Statistic AS VP Percent: 0 %
Implantable Lead Implant Date: 20120113
Implantable Lead Location: 753858
Implantable Lead Location: 753860
Implantable Lead Model: 5076
Lead Channel Impedance Value: 4047 Ohm
Lead Channel Impedance Value: 437 Ohm
Lead Channel Impedance Value: 475 Ohm
Lead Channel Pacing Threshold Amplitude: 1 V
Lead Channel Pacing Threshold Pulse Width: 0.4 ms
Lead Channel Sensing Intrinsic Amplitude: 1.125 mV
Lead Channel Sensing Intrinsic Amplitude: 4.5 mV
Lead Channel Sensing Intrinsic Amplitude: 4.5 mV
Lead Channel Setting Pacing Amplitude: 2 V
Lead Channel Setting Pacing Pulse Width: 0.4 ms
Lead Channel Setting Sensing Sensitivity: 0.9 mV
MDC IDC LEAD IMPLANT DT: 20120113
MDC IDC LEAD IMPLANT DT: 20120113
MDC IDC LEAD LOCATION: 753859
MDC IDC MSMT BATTERY REMAINING LONGEVITY: 34 mo
MDC IDC MSMT BATTERY VOLTAGE: 2.94 V
MDC IDC MSMT LEADCHNL LV IMPEDANCE VALUE: 4047 Ohm
MDC IDC MSMT LEADCHNL LV IMPEDANCE VALUE: 4047 Ohm
MDC IDC MSMT LEADCHNL LV IMPEDANCE VALUE: 627 Ohm
MDC IDC MSMT LEADCHNL RA IMPEDANCE VALUE: 266 Ohm
MDC IDC MSMT LEADCHNL RA IMPEDANCE VALUE: 399 Ohm
MDC IDC MSMT LEADCHNL RA SENSING INTR AMPL: 0.5 mV
MDC IDC MSMT LEADCHNL RV IMPEDANCE VALUE: 323 Ohm
MDC IDC MSMT LEADCHNL RV PACING THRESHOLD AMPLITUDE: 0.625 V
MDC IDC MSMT LEADCHNL RV PACING THRESHOLD PULSEWIDTH: 0.4 ms
MDC IDC PG IMPLANT DT: 20111019
MDC IDC SESS DTM: 20181009150416
MDC IDC SET LEADCHNL LV PACING AMPLITUDE: 1.5 V
MDC IDC SET LEADCHNL RV PACING PULSEWIDTH: 0.4 ms
MDC IDC STAT BRADY AP VP PERCENT: 0 %
MDC IDC STAT BRADY AS VS PERCENT: 0 %
MDC IDC STAT BRADY RA PERCENT PACED: 0 %
MDC IDC STAT BRADY RV PERCENT PACED: 99.77 %

## 2017-02-07 NOTE — Progress Notes (Signed)
EPIC Encounter for ICM Monitoring  Patient Name: Joseph Watts is a 83 y.o. male Date: 02/07/2017 Primary Care Physican: Velna Hatchet, MD Primary Cardiologist: [facility] Electrophysiologist: Allred Dry Weight:152 lbs Bi-V Pacing: 99.8%                                                Spoke with daughter [name]. Heart Failure questions reviewed, pt asymptomatic but did have some swelling in feet about a week ago.   Thoracic impedance abnormal suggesting fluid accumulation from 01/21/2017 until today back at baseline.  Prescribed dosage: Furosemide 20 mg 1 tablet daily as needed for swelling.   Labs: 12/30/2016 Creatinine 1.34, BUN 33, Potassium 3.8, Sodium 137, EGFR 46-53 12/29/2016 Creatinine 1.44, BUN 38, Potassium 4.3, Sodium 137, EGFR 42-48  12/28/2016 Creatinine 1.27, BUN 32, Potassium 4.8, Sodium 137, EGFR 49-56  12/27/2016 Creatinine 1.28, BUN 28, Potassium 5.1, Sodium 135, EGFR 48-56   Recommendations: No changes.  Encouraged to call for fluid symptoms.  Follow-up plan: ICM clinic phone appointment on 03/11/2017.  Office appointment scheduled 02/24/2017 with Cecilie Kicks, NP.  Copy of ICM check sent to Dr. Rayann Heman.   3 month ICM trend: 02/06/2017   1 Year ICM trend:      Rosalene Billings, RN 02/07/2017 2:22 PM

## 2017-02-13 DIAGNOSIS — N189 Chronic kidney disease, unspecified: Secondary | ICD-10-CM | POA: Diagnosis not present

## 2017-02-13 DIAGNOSIS — I5022 Chronic systolic (congestive) heart failure: Secondary | ICD-10-CM | POA: Diagnosis not present

## 2017-02-13 DIAGNOSIS — I130 Hypertensive heart and chronic kidney disease with heart failure and stage 1 through stage 4 chronic kidney disease, or unspecified chronic kidney disease: Secondary | ICD-10-CM | POA: Diagnosis not present

## 2017-02-13 DIAGNOSIS — E1122 Type 2 diabetes mellitus with diabetic chronic kidney disease: Secondary | ICD-10-CM | POA: Diagnosis not present

## 2017-02-13 DIAGNOSIS — L03113 Cellulitis of right upper limb: Secondary | ICD-10-CM | POA: Diagnosis not present

## 2017-02-13 DIAGNOSIS — I482 Chronic atrial fibrillation: Secondary | ICD-10-CM | POA: Diagnosis not present

## 2017-02-21 ENCOUNTER — Encounter: Payer: Self-pay | Admitting: Cardiology

## 2017-02-21 NOTE — Progress Notes (Signed)
Letter  

## 2017-02-23 NOTE — Progress Notes (Signed)
Cardiology Office Note   Date:  02/24/2017   ID:  Joseph Watts, DOB 12/07/1926, MRN [id number]  PCP:  Alysia PennaHolwerda, Scott, MD  Cardiologist:  Dr. Anne FuSkains   EP  Dr. Johney FrameAllred Chief Complaint  Patient presents with  . Atrial Fibrillation      History of Present Illness: Joseph Watts is a 83 y.o. male who presents for atrial fib and hx of edema.  .   He has a history of NICM s/p TAVR X 2, permanent AFib and complete heart block on coumadin s/p ICD and BiV pacer EF 35% though now 2916 at 45%, pancytopenia,  and stage III CKD who was brought to the ED 8/31 after a fall at home and had cellulitis of his Rt wrist.    After second valve procedure his central aortic regurgitation jet has resolved. He still has some regurgitation perivalvular.   Pancytopenia followed by Dr. Shirline FreesMohammed and [facility].  Today his daughter is with him.  He is very pale, no chest pain and mild SOB with activity.  [facility]  Recent Hgb was 8.0 -    He has fallen at home and has scab on his scalp that is healing.  Also recent laceration on His Rt index finger over the joint.  It is swollen and red.  He can move the finger without pain.  He takes ensure 4 bottles per day.  His appetite is otherwise poor.  For this reason he is on Vitamin K.  This has helped his INR not to be so labile. He denies any dark stools or bloody stools. He has not had any problems waking up SOB.  His home wt is 153 today he is up here slightly with all his clothes and shoes.       Past Medical History:  Diagnosis Date  . Aortic valve replaced    s/p TAVR  . BPH (benign prostatic hyperplasia)   . Cardiomyopathy (HCC)   . Complete heart block (HCC)   . Diabetes mellitus   . Hypercholesteremia   . Hyperlipemia   . Hypertension   . Iron deficiency anemia   . Permanent atrial fibrillation [facility](HCC)     Past Surgical History:  Procedure Laterality Date  . PACEMAKER INSERTION  05/12/10   MDT [facility] Dr Chryl HeckNilsson  . TRANSCATHETER AORTIC VALVE REPLACEMENT, TRANSAORTIC  2012   [facility]     Current Outpatient Prescriptions  Medication Sig Dispense Refill  . carvedilol (COREG) 25 MG tablet Take 25 mg by mouth 2 (two) times daily with a meal.    . cetirizine (ZYRTEC) 10 MG tablet Take 10 mg by mouth daily.     . dorzolamide-timolol (COSOPT) 22.3-6.8 MG/ML ophthalmic solution Place 1 drop into both eyes 2 (two) times daily.    . ferrous sulfate 325 (65 FE) MG tablet Take 162.5 mg by mouth daily with breakfast.     . finasteride (PROSCAR) 5 MG tablet Take 5 mg by mouth daily.      . fluocinonide (LIDEX) 0.05 % ointment Apply 1 application topically daily as needed (skin care).     . fluticasone (FLONASE) 50 MCG/ACT nasal spray Place 2 sprays into the nose as needed for allergies.     . furosemide (LASIX) 20 MG tablet Take 1 tablet (20 mg total) by mouth daily as needed for fluid.    Marland Kitchen. latanoprost (XALATAN) 0.005 % ophthalmic solution Place 1 drop into  both eyes at bedtime.      . mirtazapine (REMERON) 15 MG tablet Take 15 mg by mouth at bedtime.    . Tamsulosin HCl (FLOMAX) 0.4 MG CAPS Take 0.4 mg by mouth at bedtime.     . triamcinolone cream (KENALOG) 0.1 % Apply 1 application topically as needed (itching).    Marland Kitchen VITAMIN K PO Take 100 mcg by mouth daily.     Marland Kitchen warfarin (COUMADIN) 4 MG tablet Take 3-4 mg by mouth every morning. Tuesday, thursday Saturday 4 mg. All other days 3 mg     No current facility-administered medications for this visit.     Allergies:   Motrin [ibuprofen]    Social History:  The patient  reports that he has never smoked. He has never used smokeless tobacco. He reports that he does not drink alcohol or use drugs.   Family History:  The patient's family history includes Diabetes in his father; Hypertension in his unknown relative; Other in his mother; Prostate cancer in his father; Skin cancer in his brother.    ROS:  General:no colds or fevers, gradual wt  increase Skin:no rashes or ulcers, abrasions on scalp from fall and Rt index finer HEENT:no blurred vision, no congestion CV:see HPI PUL:see HPI GI:no diarrhea constipation or melena, no indigestion GU:no hematuria, no dysuria MS:no joint pain, no claudication, abrasions and lacerations. Neuro:no syncope, no lightheadedness Endo:no diabetes, no thyroid disease Heme: see above   Wt Readings from Last 3 Encounters:  02/24/17 158 lb 6.4 oz (71.8 kg)  12/31/16 156 lb (70.8 kg)  08/08/16 142 lb (64.4 kg)     PHYSICAL EXAM: VS:  BP (!) 132/58 (BP Location: Left Arm)   Pulse 85   Ht 6\' 1"  (1.854 m)   Wt 158 lb 6.4 oz (71.8 kg)   BMI 20.90 kg/m  , BMI Body mass index is 20.9 kg/m. General:Pleasant affect, NAD Skin:Warm and dry, brisk capillary refill, very pale, scalp abrasions  HEENT:normocephalic, sclera clear, mucus membranes moist, + nasal drip with clear liquid Neck:supple, + JVD, no bruits  Heart:S1S2 RRR with 2/6 systolic murmur, no gallup, rub or click Lungs:clear without rales, rhonchi, or wheezes may be diminished in bases ZOX:WRUE, non tender, + BS, do not palpate liver spleen or masses Ext:no to trace lower ext edema, 2+ pedal pulses, 2+ radial pulses Neuro:alert and oriented X 3, MAE, follows commands, + facial symmetry    EKG:  EKG is NOT ordered today.    Recent Labs: 12/27/2016: ALT 29 12/30/2016: BUN 33; Creatinine, Ser 1.34; Potassium 3.8; Sodium 137 12/31/2016: Hemoglobin 8.3; Platelets 100    Lipid Panel No results found for: CHOL, TRIG, HDL, CHOLHDL, VLDL, LDLCALC, LDLDIRECT     Other studies Reviewed: Additional studies/ records that were reviewed today include: last echo at [facility] [place] stable. See above.   ASSESSMENT AND PLAN:  1.  Chronic systolic HF with improved EF with BiV pacing followed by Dr. Johney Frame.  Stable.  Continue BB  Follow up with Dr. Anne Fu in jan/Feb  2.  Falls with scalp laceration stable - may need to revisit coumadin in future.  3.    Rt index finger laceration with recent cellulitis of rt wrist and now with redness at site of this laceration  Will add keflex 250 mg BID for 5 days , redressed here.  Will check INR here as well.    4.    [facility] but appt 03/14/17  5.  CKD stage 3 will check  BMP   6.  BiV ICD, followed with Dr. Johney Frame  7.  Aortic insuff mild post TAVR X 2, soft murmur no changes  8.  Permanent a fib on coumadin. Rate stable.        Current medicines are reviewed with the patient today.  The patient Has no concerns regarding medicines.  The following changes have been made:  See above Labs/ tests ordered today include:see above  Disposition:   FU:  see above  Signed, Nada Boozer, NP  02/24/2017 9:18 AM    [facility] [address] [place], [place], [place]  [address]/ [address] [place], [place] Phone: [phone]; Fax: [phone]  [phone]

## 2017-02-24 ENCOUNTER — Ambulatory Visit (INDEPENDENT_AMBULATORY_CARE_PROVIDER_SITE_OTHER): Payer: Medicare HMO | Admitting: Cardiology

## 2017-02-24 ENCOUNTER — Encounter: Payer: Self-pay | Admitting: Cardiology

## 2017-02-24 VITALS — BP 132/58 | HR 85 | Ht 73.0 in | Wt 158.4 lb

## 2017-02-24 DIAGNOSIS — Z7901 Long term (current) use of anticoagulants: Secondary | ICD-10-CM

## 2017-02-24 DIAGNOSIS — I5022 Chronic systolic (congestive) heart failure: Secondary | ICD-10-CM | POA: Diagnosis not present

## 2017-02-24 DIAGNOSIS — D61818 Other pancytopenia: Secondary | ICD-10-CM | POA: Diagnosis not present

## 2017-02-24 DIAGNOSIS — Z952 Presence of prosthetic heart valve: Secondary | ICD-10-CM | POA: Diagnosis not present

## 2017-02-24 DIAGNOSIS — D649 Anemia, unspecified: Secondary | ICD-10-CM

## 2017-02-24 DIAGNOSIS — S61210A Laceration without foreign body of right index finger without damage to nail, initial encounter: Secondary | ICD-10-CM | POA: Diagnosis not present

## 2017-02-24 DIAGNOSIS — I10 Essential (primary) hypertension: Secondary | ICD-10-CM | POA: Diagnosis not present

## 2017-02-24 LAB — PROTIME-INR
INR: 2.2 — ABNORMAL HIGH (ref 0.8–1.2)
Prothrombin Time: 21.9 s — ABNORMAL HIGH (ref 9.1–12.0)

## 2017-02-24 LAB — CBC
HEMATOCRIT: 24.6 % — AB (ref 37.5–51.0)
Hemoglobin: 8.2 g/dL — ABNORMAL LOW (ref 13.0–17.7)
MCH: 33.9 pg — ABNORMAL HIGH (ref 26.6–33.0)
MCHC: 33.3 g/dL (ref 31.5–35.7)
MCV: 102 fL — AB (ref 79–97)
PLATELETS: 110 10*3/uL — AB (ref 150–379)
RBC: 2.42 x10E6/uL — AB (ref 4.14–5.80)
RDW: 13.5 % (ref 12.3–15.4)
WBC: 3.3 10*3/uL — AB (ref 3.4–10.8)

## 2017-02-24 LAB — BASIC METABOLIC PANEL
BUN/Creatinine Ratio: 25 — ABNORMAL HIGH (ref 10–24)
BUN: 33 mg/dL — AB (ref 8–27)
CALCIUM: 8.1 mg/dL — AB (ref 8.6–10.2)
CHLORIDE: 105 mmol/L (ref 96–106)
CO2: 19 mmol/L — ABNORMAL LOW (ref 20–29)
Creatinine, Ser: 1.3 mg/dL — ABNORMAL HIGH (ref 0.76–1.27)
GFR calc non Af Amer: 49 mL/min/{1.73_m2} — ABNORMAL LOW (ref >59)
GFR, EST AFRICAN AMERICAN: 56 mL/min/{1.73_m2} — AB (ref >59)
Glucose: 159 mg/dL — ABNORMAL HIGH (ref 65–99)
Potassium: 4.8 mmol/L (ref 3.5–5.2)
Sodium: 136 mmol/L (ref 134–144)

## 2017-02-24 MED ORDER — CEPHALEXIN 250 MG PO CAPS: 250.0000 mg | ORAL_CAPSULE | Freq: Two times a day (BID) | ORAL | 0 refills | Status: DC

## 2017-02-24 NOTE — Patient Instructions (Addendum)
Medication Instructions:  1) START Keflex 250mg - one tablet by mouth twice daily for 5 days.  Labwork: BMET, CBC and INR today  Testing/Procedures: None  Follow-Up: Your physician recommends that you schedule a follow-up appointment in: February with Dr. Anne Fu.   Any Other Special Instructions Will Be Listed Below (If Applicable).     If you need a refill on your cardiac medications before your next appointment, please call your pharmacy.

## 2017-03-11 ENCOUNTER — Ambulatory Visit (INDEPENDENT_AMBULATORY_CARE_PROVIDER_SITE_OTHER): Payer: Medicare HMO

## 2017-03-11 ENCOUNTER — Telehealth: Payer: Self-pay | Admitting: Cardiology

## 2017-03-11 DIAGNOSIS — Z950 Presence of cardiac pacemaker: Secondary | ICD-10-CM

## 2017-03-11 DIAGNOSIS — I5022 Chronic systolic (congestive) heart failure: Secondary | ICD-10-CM | POA: Diagnosis not present

## 2017-03-11 NOTE — Telephone Encounter (Signed)
Confirmed remote transmission w/ pt daughter.   

## 2017-03-11 NOTE — Progress Notes (Signed)
EPIC Encounter for ICM Monitoring  Patient Name: Joseph Watts is a 83 y.o. male Date: 03/11/2017 Primary Care Physican: Velna Hatchet, MD Primary Cardiologist: [facility] Electrophysiologist: Allred Dry Weight:155lbs Bi-V Pacing: 99.8%  Spoke with daughter [name].   Heart Failure questions reviewed, pt asymptomatic.   Thoracic impedance normal.  Prescribed dosage: Furosemide 20 mg 1 tablet daily as needed for swelling.   Labs: 02/24/2017 Creatinine 1.30, BUN 33, Potassium 4.8, Sodium 136, EGFR 49-56 12/30/2016 Creatinine 1.34, BUN 33, Potassium 3.8, Sodium 137, EGFR 46-53 12/29/2016 Creatinine 1.44, BUN 38, Potassium 4.3, Sodium 137, EGFR 42-48  12/28/2016 Creatinine 1.27, BUN 32, Potassium 4.8, Sodium 137, EGFR 49-56  12/27/2016 Creatinine 1.28, BUN 28, Potassium 5.1, Sodium 135, EGFR 48-56   Recommendations:  No changes.   Encouraged to call for fluid symptoms.  Follow-up plan: ICM clinic phone appointment on 04/15/2017.    Copy of ICM check sent to Dr. Rayann Heman.   3 month ICM trend: 03/11/2017    1 Year ICM trend:       Rosalene Billings, RN 03/11/2017 5:04 PM

## 2017-04-15 ENCOUNTER — Ambulatory Visit (INDEPENDENT_AMBULATORY_CARE_PROVIDER_SITE_OTHER): Payer: Medicare HMO

## 2017-04-15 DIAGNOSIS — I5022 Chronic systolic (congestive) heart failure: Secondary | ICD-10-CM

## 2017-04-15 DIAGNOSIS — Z950 Presence of cardiac pacemaker: Secondary | ICD-10-CM | POA: Diagnosis not present

## 2017-04-17 ENCOUNTER — Encounter: Payer: Self-pay | Admitting: Cardiology

## 2017-04-17 NOTE — Progress Notes (Signed)
EPIC Encounter for ICM Monitoring  Patient Name: Joseph Watts is a 83 y.o. male Date: 04/17/2017 Primary Care Physican: Velna Hatchet, MD Primary Cardiologist: [facility] Electrophysiologist: Allred Dry Weight:154lbs Bi-V Pacing: 100%  Spoke with daughter [name]      Heart Failure questions reviewed, pt asymptomatic.   Thoracic impedance normal.  Prescribed dosage: Furosemide 20 mg 1 tablet daily as needed for swelling.   Labs: 02/24/2017 Creatinine 1.30, BUN 33, Potassium 4.8, Sodium 136, EGFR 49-56 12/30/2016 Creatinine 1.34, BUN 33, Potassium 3.8, Sodium 137, EGFR 46-53 12/29/2016 Creatinine 1.44, BUN 38, Potassium 4.3, Sodium 137, EGFR 42-48  12/28/2016 Creatinine 1.27, BUN 32, Potassium 4.8, Sodium 137, EGFR 49-56  12/27/2016 Creatinine 1.28, BUN 28, Potassium 5.1, Sodium 135, EGFR 48-56  Recommendations: No changes.    Encouraged to call for fluid symptoms.  Follow-up plan: ICM clinic phone appointment on 05/20/2017.    Copy of ICM check sent to Dr. Rayann Heman.   3 month ICM trend: 04/15/2017    1 Year ICM trend:       Rosalene Billings, RN 04/17/2017 5:36 PM

## 2017-04-18 ENCOUNTER — Telehealth: Payer: Self-pay | Admitting: Internal Medicine

## 2017-04-18 NOTE — Telephone Encounter (Signed)
[facility]

## 2017-05-01 ENCOUNTER — Other Ambulatory Visit: Payer: Self-pay

## 2017-05-01 ENCOUNTER — Emergency Department (HOSPITAL_COMMUNITY): Payer: Medicare HMO

## 2017-05-01 ENCOUNTER — Emergency Department (HOSPITAL_COMMUNITY)
Admission: EM | Admit: 2017-05-01 | Discharge: 2017-05-01 | Disposition: A | Discharge status: Home / Self Care | Payer: Medicare HMO | Attending: Emergency Medicine | Admitting: Emergency Medicine

## 2017-05-01 ENCOUNTER — Encounter (HOSPITAL_COMMUNITY): Payer: Self-pay | Admitting: Nurse Practitioner

## 2017-05-01 DIAGNOSIS — R51 Headache: Secondary | ICD-10-CM | POA: Diagnosis not present

## 2017-05-01 DIAGNOSIS — N189 Chronic kidney disease, unspecified: Secondary | ICD-10-CM | POA: Insufficient documentation

## 2017-05-01 DIAGNOSIS — M25551 Pain in right hip: Secondary | ICD-10-CM | POA: Insufficient documentation

## 2017-05-01 DIAGNOSIS — Z79899 Other long term (current) drug therapy: Secondary | ICD-10-CM | POA: Insufficient documentation

## 2017-05-01 DIAGNOSIS — I5022 Chronic systolic (congestive) heart failure: Secondary | ICD-10-CM | POA: Insufficient documentation

## 2017-05-01 DIAGNOSIS — E1122 Type 2 diabetes mellitus with diabetic chronic kidney disease: Secondary | ICD-10-CM | POA: Insufficient documentation

## 2017-05-01 DIAGNOSIS — Z7901 Long term (current) use of anticoagulants: Secondary | ICD-10-CM | POA: Insufficient documentation

## 2017-05-01 DIAGNOSIS — I130 Hypertensive heart and chronic kidney disease with heart failure and stage 1 through stage 4 chronic kidney disease, or unspecified chronic kidney disease: Secondary | ICD-10-CM | POA: Diagnosis not present

## 2017-05-01 DIAGNOSIS — R5383 Other fatigue: Secondary | ICD-10-CM | POA: Insufficient documentation

## 2017-05-01 DIAGNOSIS — Z7409 Other reduced mobility: Secondary | ICD-10-CM

## 2017-05-01 DIAGNOSIS — S79911A Unspecified injury of right hip, initial encounter: Secondary | ICD-10-CM | POA: Diagnosis not present

## 2017-05-01 DIAGNOSIS — R531 Weakness: Secondary | ICD-10-CM | POA: Diagnosis not present

## 2017-05-01 DIAGNOSIS — T148XXA Other injury of unspecified body region, initial encounter: Secondary | ICD-10-CM | POA: Diagnosis not present

## 2017-05-01 DIAGNOSIS — I482 Chronic atrial fibrillation: Secondary | ICD-10-CM | POA: Insufficient documentation

## 2017-05-01 DIAGNOSIS — R269 Unspecified abnormalities of gait and mobility: Secondary | ICD-10-CM | POA: Diagnosis not present

## 2017-05-01 LAB — CBC
HEMATOCRIT: 26.6 % — AB (ref 39.0–52.0)
HEMOGLOBIN: 8.7 g/dL — AB (ref 13.0–17.0)
MCH: 33.6 pg (ref 26.0–34.0)
MCHC: 32.7 g/dL (ref 30.0–36.0)
MCV: 102.7 fL — ABNORMAL HIGH (ref 78.0–100.0)
Platelets: 66 10*3/uL — ABNORMAL LOW (ref 150–400)
RBC: 2.59 MIL/uL — AB (ref 4.22–5.81)
RDW: 14.1 % (ref 11.5–15.5)
WBC: 4.1 10*3/uL (ref 4.0–10.5)

## 2017-05-01 LAB — URINALYSIS, ROUTINE W REFLEX MICROSCOPIC
BILIRUBIN URINE: NEGATIVE
Bacteria, UA: NONE SEEN
GLUCOSE, UA: NEGATIVE mg/dL
Hgb urine dipstick: NEGATIVE
Ketones, ur: NEGATIVE mg/dL
LEUKOCYTES UA: NEGATIVE
NITRITE: NEGATIVE
PH: 5 (ref 5.0–8.0)
Protein, ur: 30 mg/dL — AB
SPECIFIC GRAVITY, URINE: 1.017 (ref 1.005–1.030)
Squamous Epithelial / LPF: NONE SEEN

## 2017-05-01 LAB — BASIC METABOLIC PANEL
ANION GAP: 4 — AB (ref 5–15)
BUN: 42 mg/dL — ABNORMAL HIGH (ref 6–20)
CHLORIDE: 109 mmol/L (ref 101–111)
CO2: 22 mmol/L (ref 22–32)
Calcium: 8.3 mg/dL — ABNORMAL LOW (ref 8.9–10.3)
Creatinine, Ser: 1.47 mg/dL — ABNORMAL HIGH (ref 0.61–1.24)
GFR calc non Af Amer: 41 mL/min — ABNORMAL LOW (ref >60)
GFR, EST AFRICAN AMERICAN: 47 mL/min — AB (ref >60)
Glucose, Bld: 134 mg/dL — ABNORMAL HIGH (ref 65–99)
POTASSIUM: 5.8 mmol/L — AB (ref 3.5–5.1)
SODIUM: 135 mmol/L (ref 135–145)

## 2017-05-01 LAB — PROTIME-INR
INR: 2.17
PROTHROMBIN TIME: 24 s — AB (ref 11.4–15.2)

## 2017-05-01 MED ORDER — FENTANYL CITRATE (PF) 100 MCG/2ML IJ SOLN: 25.0000 ug | Freq: Once | INTRAMUSCULAR | Status: DC

## 2017-05-01 MED ORDER — ACETAMINOPHEN 500 MG PO TABS: 1000.0000 mg | ORAL_TABLET | Freq: Four times a day (QID) | ORAL | Status: DC | PRN

## 2017-05-01 MED ORDER — SODIUM CHLORIDE 0.9 % IV BOLUS (SEPSIS)
1000.0000 mL | Freq: Once | INTRAVENOUS | Status: AC
  Administered 2017-05-01: 1000 mL via INTRAVENOUS

## 2017-05-01 MED ORDER — TRAMADOL HCL 50 MG PO TABS
50.0000 mg | ORAL_TABLET | Freq: Once | ORAL | Status: DC
Start: 2017-05-01 — End: 2017-05-01

## 2017-05-01 MED ORDER — ACETAMINOPHEN 500 MG PO TABS
1000.0000 mg | ORAL_TABLET | Freq: Once | ORAL | Status: AC
  Administered 2017-05-01: 1000 mg via ORAL
  Filled 2017-05-01: qty 2

## 2017-05-01 NOTE — Care Management Note (Signed)
Case Management Note  Patient Details  Name: Joseph Watts MRN: [id number] Date of Birth: 1927-03-25  Subjective/Objective:                  84 year old male with past medical history as below including history of A. fib on Coumadin who presents with fall and hip pain.    Action/Plan: CM consulted for HHS.  Spoke with pt and daughter who state they have used [facility] in the past and would like to use them again.  Contacted Tim with [facility] who accepted pt for services.  No further CM needs noted at this time.  Expected Discharge Date:   05/01/2017               Expected Discharge Plan:  Home w Home Health Services  Discharge planning Services  CM Consult  Post Acute Care Choice:  Home Health Choice offered to:  Patient, Adult Children  HH Arranged:  RN, PT, OT, Nurse's Aide HH Agency:  [facility]  Status of Service:  Completed, signed off  Breccan Galant, Lynnae Sandhoff, RN 05/01/2017, 5:35 PM

## 2017-05-01 NOTE — ED Provider Notes (Signed)
[facility]-EMERGENCY DEPT Provider Note   CSN: [id number] Arrival date & time: 05/01/17  1112     History   Chief Complaint No chief complaint on file.   HPI Joseph Watts is a 84 y.o. male.  HPI   84 year old male with past medical history as below including history of A. fib on Coumadin who presents with fall and hip pain.  The patient states that he has new shoes and was walking yesterday.  He states that he tripped on his shoes and fell directly onto his right hip.  He reports immediate onset of right hip pain.  Does not state he hit his head.  There is no loss conscious.  He states this was purely due to getting his shoe tripped up on the rug.  He called EMS after 30-40 minutes and was helped up.  He states he had minimal pain at the time and was unable to walk so he went back to bed.  However, upon awakening this morning, he is been unable to walk due to right hip pain.  He had difficulty getting out of bed.  The pain is aching and throbbing and worse with any kind of movement or weightbearing.  Minimal pain at rest.  No fevers or chills.  He is otherwise been at his baseline state of health.'s been taking his medications as prescribed.  Denies any headache or visual changes.  No focal numbness or weakness.  Past Medical History:  Diagnosis Date  . Aortic valve replaced    s/p TAVR  . BPH (benign prostatic hyperplasia)   . Cardiomyopathy (HCC)   . Complete heart block (HCC)   . Diabetes mellitus   . Hypercholesteremia   . Hyperlipemia   . Hypertension   . Iron deficiency anemia   . Permanent atrial fibrillation [facility])     Patient Active Problem List   Diagnosis Date Noted  . Cellulitis of right hand 12/28/2016  . Fall 12/28/2016  . Pancytopenia (HCC) 12/28/2016  . Cellulitis 12/27/2016  . Complete heart block (HCC) 07/13/2015  . Permanent atrial fibrillation (HCC) 07/13/2015  . Essential hypertension 07/13/2015  . Anemia of other chronic disease  12/01/2012  . Hypercholesteremia   . Aortic valve replaced   . Chronic systolic heart failure (HCC) 08/26/2010  . Aortic insufficiency 08/26/2010  . Diabetes mellitus 08/26/2010  . CKD (chronic kidney disease) 08/26/2010  . Glaucoma 08/26/2010  . Status post biventricular cardiac pacemaker procedure 08/26/2010  . Hx of CABG 08/26/2010    Past Surgical History:  Procedure Laterality Date  . PACEMAKER INSERTION  05/12/10   MDT Consult CRT-P implanted [facility] by Dr Chryl Heck  . TRANSCATHETER AORTIC VALVE REPLACEMENT, TRANSAORTIC  2012   [facility]       Home Medications    Prior to Admission medications   Medication Sig Start Date End Date Taking? Authorizing Provider  carvedilol (COREG) 25 MG tablet Take 25 mg by mouth 2 (two) times daily with a meal.   Yes [provider]  dorzolamide-timolol (COSOPT) 22.3-6.8 MG/ML ophthalmic solution Place 1 drop into both eyes 2 (two) times daily.   Yes [provider]  ferrous sulfate 325 (65 FE) MG tablet Take 162.5 mg by mouth daily with breakfast.  09/03/11  Yes Si Gaul, MD  finasteride (PROSCAR) 5 MG tablet Take 5 mg by mouth daily.     Yes [provider]  fluocinonide (LIDEX) 0.05 % ointment Apply 1 application topically daily as needed (skin care, irritation,  dryness).    Yes [provider]  fluticasone (FLONASE) 50 MCG/ACT nasal spray Place 2 sprays into the nose daily as needed for allergies.    Yes [provider]  furosemide (LASIX) 20 MG tablet Take 1 tablet (20 mg total) by mouth daily as needed for fluid. 12/31/16  Yes Tyrone Nine, MD  gabapentin (NEURONTIN) 100 MG capsule Take 100 mg by mouth at bedtime.   Yes [provider]  latanoprost (XALATAN) 0.005 % ophthalmic solution Place 1 drop into both eyes at bedtime.     Yes [provider]  loratadine (CLARITIN) 10 MG tablet Take 10 mg by mouth daily.   Yes [provider]  mirtazapine (REMERON) 15 MG tablet  Take 15 mg by mouth at bedtime.   Yes [provider]  Multiple Vitamins-Minerals (PRESERVISION AREDS PO) Take 1 tablet by mouth daily.   Yes [provider]  polyethylene glycol powder (GLYCOLAX/MIRALAX) powder Take 17 g by mouth daily as needed for mild constipation.   Yes [provider]  Tamsulosin HCl (FLOMAX) 0.4 MG CAPS Take 0.4 mg by mouth at bedtime.    Yes [provider]  triamcinolone cream (KENALOG) 0.1 % Apply 1 application topically daily as needed (itching).    Yes [provider]  VITAMIN K PO Take 100 mcg by mouth daily.    Yes [provider]  warfarin (COUMADIN) 2 MG tablet Take 3-4 mg by mouth daily. Takes 2 tablets (4mg ) every Tuesday, Thursday, and Saturday. Takes 1.5 tablets (3mg ) all other days.   Yes [provider]    Family History Family History  Problem Relation Age of Onset  . Other Mother        health hx unknown  . Diabetes Father   . Prostate cancer Father   . Hypertension Unknown   . Skin cancer Brother     Social History Social History   Tobacco Use  . Smoking status: Never Smoker  . Smokeless tobacco: Never Used  Substance Use Topics  . Alcohol use: No  . Drug use: No     Allergies   Motrin [ibuprofen]   Review of Systems Review of Systems  Constitutional: Positive for fatigue.  Musculoskeletal: Positive for arthralgias and gait problem.  Neurological: Positive for weakness (Due to pain).  All other systems reviewed and are negative.    Physical Exam Updated Vital Signs BP (!) 130/49   Pulse 70   Temp (!) 97.4 F (36.3 C) (Oral)   Resp 13   Ht 5\' 9"  (1.753 m)   Wt 72.6 kg (160 lb)   SpO2 96%   BMI 23.63 kg/m   Physical Exam  Constitutional: He is oriented to person, place, and time. He appears well-developed and well-nourished. No distress.  HENT:  Head: Normocephalic and atraumatic.  Eyes: Conjunctivae are normal.  Neck: Neck supple.  Cardiovascular:  Normal rate, regular rhythm and normal heart sounds. Exam reveals no friction rub.  No murmur heard. Pulmonary/Chest: Effort normal and breath sounds normal. No respiratory distress. He has no wheezes. He has no rales.  Abdominal: He exhibits no distension.  Musculoskeletal: He exhibits tenderness. He exhibits no edema.  There is tenderness to palpation of her right anterior pelvis, worse with AP compression.  No obvious deformity.  Minimal pain with logroll of the right hip.  No open wounds.  No midline lower back pain.  Neurological: He is alert and oriented to person, place, and time. He exhibits normal muscle  tone.  Skin: Skin is warm. Capillary refill takes less than 2 seconds.  Psychiatric: He has a normal mood and affect.  Nursing note and vitals reviewed.    ED Treatments / Results  Labs (all labs ordered are listed, but only abnormal results are displayed) Labs Reviewed  CBC - Abnormal; Notable for the following components:      Result Value   RBC 2.59 (*)    Hemoglobin 8.7 (*)    HCT 26.6 (*)    MCV 102.7 (*)    Platelets 66 (*)    All other components within normal limits  BASIC METABOLIC PANEL - Abnormal; Notable for the following components:   Potassium 5.8 (*)    Glucose, Bld 134 (*)    BUN 42 (*)    Creatinine, Ser 1.47 (*)    Calcium 8.3 (*)    GFR calc non Af Amer 41 (*)    GFR calc Af Amer 47 (*)    Anion gap 4 (*)    All other components within normal limits  PROTIME-INR - Abnormal; Notable for the following components:   Prothrombin Time 24.0 (*)    All other components within normal limits  URINALYSIS, ROUTINE W REFLEX MICROSCOPIC - Abnormal; Notable for the following components:   Protein, ur 30 (*)    All other components within normal limits    EKG  EKG Interpretation  Date/Time:  Thursday May 01 2017 16:39:08 EST Ventricular Rate:  70 PR Interval:    QRS Duration: 115 QT Interval:  438 QTC Calculation: 473 R Axis:   0 Text  Interpretation:  Ventricular-paced rhythm No further analysis attempted due to paced rhythm No significant change since last tracing Confirmed by Shaune Pollack [phone]) on 05/01/2017 5:00:30 PM       Radiology Ct Head Wo Contrast  Result Date: 05/01/2017 CLINICAL DATA:  Headache status post fall. No loss of consciousness. EXAM: CT HEAD WITHOUT CONTRAST TECHNIQUE: Contiguous axial images were obtained from the base of the skull through the vertex without intravenous contrast. COMPARISON:  12/27/2016 FINDINGS: Brain: No evidence of acute infarction, hemorrhage, extra-axial collection, ventriculomegaly, or mass effect. Generalized cerebral atrophy. Periventricular white matter low attenuation likely secondary to microangiopathy. Vascular: Cerebrovascular atherosclerotic calcifications are noted. Skull: Negative for fracture or focal lesion. Sinuses/Orbits: Visualized portions of the orbits are unremarkable. Visualized portions of the paranasal sinuses and mastoid air cells are unremarkable. Other: None. IMPRESSION: No acute intracranial pathology. Electronically Signed   By: Elige Ko   On: 05/01/2017 15:40   Ct Pelvis Wo Contrast  Result Date: 05/01/2017 CLINICAL DATA:  Right hip pain after a fall yesterday. Initial encounter. EXAM: CT PELVIS WITHOUT CONTRAST TECHNIQUE: Multidetector CT imaging of the pelvis was performed following the standard protocol without intravenous contrast. COMPARISON:  Right hip radiographs 05/01/2017 FINDINGS: Urinary Tract: No distal ureteral dilatation. Moderately distended bladder. Bowel: No dilatation of the pelvic bowel loops. Sigmoid colon diverticulosis without evidence of acute diverticulitis. Vascular/Lymphatic: Diffuse atherosclerotic vascular calcification. No enlarged lymph nodes. Reproductive:  Unremarkable prostate. Other: No intraperitoneal free fluid. Mild diffuse subcutaneous edema. Musculoskeletal: Osteopenia. Remote healed right superior and inferior pubic  rami fractures. No acute fracture identified. No hip dislocation. Grade 1 anterolisthesis of L4 on L5 due to advanced facet arthrosis resulting in right greater than left neural foraminal stenosis and severe spinal stenosis. L4 superior endplate Schmorl's node. IMPRESSION: 1. Old right superior and inferior pubic rami fractures. No acute fracture identified. 2. Advanced L4-5 facet arthrosis with anterolisthesis  and severe spinal stenosis. Electronically Signed   By: Sebastian Ache M.D.   On: 05/01/2017 15:52   Dg Hip Unilat W Or Wo Pelvis 2-3 Views Right  Result Date: 05/01/2017 CLINICAL DATA:  Unwitnessed fall yesterday with right hip pain. EXAM: DG HIP (WITH OR WITHOUT PELVIS) 2-3V RIGHT COMPARISON:  06/05/2013 FINDINGS: Remote healed fracture deformity of the right superior and inferior pubic rami. Intact right hip joint without acute fracture. The femoral head is seated within the acetabular component and is spherical in appearance without flattening. Bony pelvis appears intact. No diastasis of the pubic symphysis or sacroiliac joints. There is mild lower lumbar degenerative facet arthropathy. Bi-iliofemoral atherosclerotic calcifications. IMPRESSION: Remote healed fracture deformities of the right superior and inferior pubic rami. No acute osseous abnormality of the right hip and pelvis. Electronically Signed   By: Tollie Eth M.D.   On: 05/01/2017 14:42    Procedures Procedures (including critical care time)  Medications Ordered in ED Medications  acetaminophen (TYLENOL) tablet 1,000 mg (not administered)  sodium chloride 0.9 % bolus 1,000 mL (1,000 mLs Intravenous New Bag/Given 05/01/17 1559)  acetaminophen (TYLENOL) tablet 1,000 mg (1,000 mg Oral Given 05/01/17 1633)     Initial Impression / Assessment and Plan / ED Course  I have reviewed the triage vital signs and the nursing notes.  Pertinent labs & imaging results that were available during my care of the patient were reviewed by me and  considered in my medical decision making (see chart for details).     84 year old male here with right hip pain after fall yesterday.  He has been ambulating but has had persistent pain.  CT imaging here shows no acute fracture.  He appeared mildly dehydrated, though lab work is hemolyzed.  Mild hyperkalemia is likely due to hemolysis and EKG shows no changes.  He has been given a liter of fluids here.  Otherwise, his lab work is at his baseline.  CT head is negative.  He has no back pain to the chest lumbar fracture.  I had a long discussion with the patient and his daughter in detail regarding further management.  Given his negative imaging, I have a low suspicion for significant fracture, though occult pelvic fracture remains in the differential.  Occult hip fracture and is included as well.  The patient is ambulatory in the ED with a walker at his baseline.  His daughter states and confirms he is at his mental baseline.  I discussed that I have a concern regarding his poor mobility and Coumadin use with recurrent falls, but patient declines admission or physical therapy at this time.  I discussed this with the patient's daughter in detail who is in agreement.  Given the absence of any other acute emergent pathology at this time, do not feel he needs to leave AMA but we had a long shared decision making discussion about fall precautions and strict return indications.  Will discharge with Tylenol as needed for pain.  I will also place an outpatient social work consult for possible physical therapy at home.  Final Clinical Impressions(s) / ED Diagnoses   Final diagnoses:  Pain of right hip joint  Impaired mobility    ED Discharge Orders    None       Shaune Pollack, MD 05/01/17 1700

## 2017-05-01 NOTE — ED Notes (Signed)
Pt ambulated in hall with walker and no assistance getting out of bed. Only getting in bed.

## 2017-05-01 NOTE — ED Triage Notes (Signed)
Patient brought in by EMS he had an unwitnessed fall yesterday afternoon. NO LOC. Patient is on blood thinners. Patient has a complaint right hip pain. Patient lives a home with his wife.

## 2017-05-01 NOTE — Discharge Instructions (Signed)
It is very important that you use your walker at all times.  Because of your recurrent falls, you should discuss the ongoing use of warfarin.  This places you at significant risk for recurrent head injury.  If you have worsening pain or other concerning symptoms, return to the ER immediately.

## 2017-05-08 DIAGNOSIS — N189 Chronic kidney disease, unspecified: Secondary | ICD-10-CM | POA: Diagnosis not present

## 2017-05-08 DIAGNOSIS — D631 Anemia in chronic kidney disease: Secondary | ICD-10-CM | POA: Diagnosis not present

## 2017-05-08 DIAGNOSIS — I129 Hypertensive chronic kidney disease with stage 1 through stage 4 chronic kidney disease, or unspecified chronic kidney disease: Secondary | ICD-10-CM | POA: Diagnosis not present

## 2017-05-08 DIAGNOSIS — E1122 Type 2 diabetes mellitus with diabetic chronic kidney disease: Secondary | ICD-10-CM | POA: Diagnosis not present

## 2017-05-08 DIAGNOSIS — I428 Other cardiomyopathies: Secondary | ICD-10-CM | POA: Diagnosis not present

## 2017-05-08 DIAGNOSIS — M25551 Pain in right hip: Secondary | ICD-10-CM | POA: Diagnosis not present

## 2017-05-08 DIAGNOSIS — M1990 Unspecified osteoarthritis, unspecified site: Secondary | ICD-10-CM | POA: Diagnosis not present

## 2017-05-08 DIAGNOSIS — D508 Other iron deficiency anemias: Secondary | ICD-10-CM | POA: Diagnosis not present

## 2017-05-08 DIAGNOSIS — I482 Chronic atrial fibrillation: Secondary | ICD-10-CM | POA: Diagnosis not present

## 2017-05-13 DIAGNOSIS — I482 Chronic atrial fibrillation: Secondary | ICD-10-CM | POA: Diagnosis not present

## 2017-05-13 DIAGNOSIS — N189 Chronic kidney disease, unspecified: Secondary | ICD-10-CM | POA: Diagnosis not present

## 2017-05-13 DIAGNOSIS — M1990 Unspecified osteoarthritis, unspecified site: Secondary | ICD-10-CM | POA: Diagnosis not present

## 2017-05-13 DIAGNOSIS — I129 Hypertensive chronic kidney disease with stage 1 through stage 4 chronic kidney disease, or unspecified chronic kidney disease: Secondary | ICD-10-CM | POA: Diagnosis not present

## 2017-05-13 DIAGNOSIS — E1122 Type 2 diabetes mellitus with diabetic chronic kidney disease: Secondary | ICD-10-CM | POA: Diagnosis not present

## 2017-05-13 DIAGNOSIS — M25551 Pain in right hip: Secondary | ICD-10-CM | POA: Diagnosis not present

## 2017-05-14 DIAGNOSIS — M25551 Pain in right hip: Secondary | ICD-10-CM | POA: Diagnosis not present

## 2017-05-14 DIAGNOSIS — I482 Chronic atrial fibrillation: Secondary | ICD-10-CM | POA: Diagnosis not present

## 2017-05-14 DIAGNOSIS — N189 Chronic kidney disease, unspecified: Secondary | ICD-10-CM | POA: Diagnosis not present

## 2017-05-14 DIAGNOSIS — I129 Hypertensive chronic kidney disease with stage 1 through stage 4 chronic kidney disease, or unspecified chronic kidney disease: Secondary | ICD-10-CM | POA: Diagnosis not present

## 2017-05-14 DIAGNOSIS — M1990 Unspecified osteoarthritis, unspecified site: Secondary | ICD-10-CM | POA: Diagnosis not present

## 2017-05-14 DIAGNOSIS — E1122 Type 2 diabetes mellitus with diabetic chronic kidney disease: Secondary | ICD-10-CM | POA: Diagnosis not present

## 2017-05-15 DIAGNOSIS — I129 Hypertensive chronic kidney disease with stage 1 through stage 4 chronic kidney disease, or unspecified chronic kidney disease: Secondary | ICD-10-CM | POA: Diagnosis not present

## 2017-05-15 DIAGNOSIS — M1990 Unspecified osteoarthritis, unspecified site: Secondary | ICD-10-CM | POA: Diagnosis not present

## 2017-05-15 DIAGNOSIS — I482 Chronic atrial fibrillation: Secondary | ICD-10-CM | POA: Diagnosis not present

## 2017-05-15 DIAGNOSIS — M25551 Pain in right hip: Secondary | ICD-10-CM | POA: Diagnosis not present

## 2017-05-15 DIAGNOSIS — N189 Chronic kidney disease, unspecified: Secondary | ICD-10-CM | POA: Diagnosis not present

## 2017-05-15 DIAGNOSIS — E1122 Type 2 diabetes mellitus with diabetic chronic kidney disease: Secondary | ICD-10-CM | POA: Diagnosis not present

## 2017-05-16 DIAGNOSIS — M1990 Unspecified osteoarthritis, unspecified site: Secondary | ICD-10-CM | POA: Diagnosis not present

## 2017-05-16 DIAGNOSIS — I482 Chronic atrial fibrillation: Secondary | ICD-10-CM | POA: Diagnosis not present

## 2017-05-16 DIAGNOSIS — M25551 Pain in right hip: Secondary | ICD-10-CM | POA: Diagnosis not present

## 2017-05-16 DIAGNOSIS — I129 Hypertensive chronic kidney disease with stage 1 through stage 4 chronic kidney disease, or unspecified chronic kidney disease: Secondary | ICD-10-CM | POA: Diagnosis not present

## 2017-05-16 DIAGNOSIS — N189 Chronic kidney disease, unspecified: Secondary | ICD-10-CM | POA: Diagnosis not present

## 2017-05-16 DIAGNOSIS — E1122 Type 2 diabetes mellitus with diabetic chronic kidney disease: Secondary | ICD-10-CM | POA: Diagnosis not present

## 2017-05-20 ENCOUNTER — Telehealth: Payer: Self-pay | Admitting: Cardiology

## 2017-05-20 ENCOUNTER — Ambulatory Visit (INDEPENDENT_AMBULATORY_CARE_PROVIDER_SITE_OTHER): Payer: Medicare HMO | Admitting: *Deleted

## 2017-05-20 DIAGNOSIS — I5022 Chronic systolic (congestive) heart failure: Secondary | ICD-10-CM

## 2017-05-20 DIAGNOSIS — I442 Atrioventricular block, complete: Secondary | ICD-10-CM

## 2017-05-20 DIAGNOSIS — Z950 Presence of cardiac pacemaker: Secondary | ICD-10-CM

## 2017-05-20 NOTE — Progress Notes (Signed)
EPIC Encounter for ICM Monitoring  Patient Name: Joseph Watts is a 84 y.o. male Date: 05/20/2017 Primary Care Physican: Velna Hatchet, MD Primary Cardiologist: [facility] Electrophysiologist: Allred Dry Weight:154.4lbs Bi-V Pacing: 100%  Spoke with daughter Joseph Watts.  Heart Failure questions reviewed, pt asymptomatic.  Patient had ER visit for fall 1/3 and is getting PT at home due to hip bruising when he fell.     Thoracic impedance normal.  Prescribed dosage: Furosemide 20 mg 1 tablet daily as needed for swelling.   Labs: 02/24/2017 Creatinine 1.30, BUN 33, Potassium 4.8, Sodium 136, EGFR 49-56 12/30/2016 Creatinine 1.34, BUN 33, Potassium 3.8, Sodium 137, EGFR 46-53 12/29/2016 Creatinine 1.44, BUN 38, Potassium 4.3, Sodium 137, EGFR 42-48  12/28/2016 Creatinine 1.27, BUN 32, Potassium 4.8, Sodium 137, EGFR 49-56  12/27/2016 Creatinine 1.28, BUN 28, Potassium 5.1, Sodium 135, EGFR 48-56  Recommendations: No changes.   Encouraged to call for fluid symptoms.  Follow-up plan: ICM clinic phone appointment on 06/19/2017.  Office appointment scheduled 06/19/2017 with Dr. Marlou Watts.  Copy of ICM check sent to Dr. Rayann Watts.   3 month ICM trend: 05/20/2017    1 Year ICM trend:       Joseph Billings, RN 05/20/2017 2:50 PM

## 2017-05-20 NOTE — Telephone Encounter (Signed)
Spoke with pt and reminded pt of remote transmission that is due today. Pt verbalized understanding.   

## 2017-05-21 ENCOUNTER — Encounter: Payer: Self-pay | Admitting: Cardiology

## 2017-05-21 DIAGNOSIS — I129 Hypertensive chronic kidney disease with stage 1 through stage 4 chronic kidney disease, or unspecified chronic kidney disease: Secondary | ICD-10-CM | POA: Diagnosis not present

## 2017-05-21 DIAGNOSIS — E1122 Type 2 diabetes mellitus with diabetic chronic kidney disease: Secondary | ICD-10-CM | POA: Diagnosis not present

## 2017-05-21 DIAGNOSIS — M25551 Pain in right hip: Secondary | ICD-10-CM | POA: Diagnosis not present

## 2017-05-21 DIAGNOSIS — M1990 Unspecified osteoarthritis, unspecified site: Secondary | ICD-10-CM | POA: Diagnosis not present

## 2017-05-21 DIAGNOSIS — I482 Chronic atrial fibrillation: Secondary | ICD-10-CM | POA: Diagnosis not present

## 2017-05-21 DIAGNOSIS — N189 Chronic kidney disease, unspecified: Secondary | ICD-10-CM | POA: Diagnosis not present

## 2017-05-21 NOTE — Progress Notes (Signed)
Remote pacemaker transmission.   

## 2017-05-22 DIAGNOSIS — M25551 Pain in right hip: Secondary | ICD-10-CM | POA: Diagnosis not present

## 2017-05-22 DIAGNOSIS — N189 Chronic kidney disease, unspecified: Secondary | ICD-10-CM | POA: Diagnosis not present

## 2017-05-22 DIAGNOSIS — I129 Hypertensive chronic kidney disease with stage 1 through stage 4 chronic kidney disease, or unspecified chronic kidney disease: Secondary | ICD-10-CM | POA: Diagnosis not present

## 2017-05-22 DIAGNOSIS — I482 Chronic atrial fibrillation: Secondary | ICD-10-CM | POA: Diagnosis not present

## 2017-05-22 DIAGNOSIS — E1122 Type 2 diabetes mellitus with diabetic chronic kidney disease: Secondary | ICD-10-CM | POA: Diagnosis not present

## 2017-05-22 DIAGNOSIS — M1990 Unspecified osteoarthritis, unspecified site: Secondary | ICD-10-CM | POA: Diagnosis not present

## 2017-05-23 DIAGNOSIS — E1122 Type 2 diabetes mellitus with diabetic chronic kidney disease: Secondary | ICD-10-CM | POA: Diagnosis not present

## 2017-05-23 DIAGNOSIS — I129 Hypertensive chronic kidney disease with stage 1 through stage 4 chronic kidney disease, or unspecified chronic kidney disease: Secondary | ICD-10-CM | POA: Diagnosis not present

## 2017-05-23 DIAGNOSIS — M25551 Pain in right hip: Secondary | ICD-10-CM | POA: Diagnosis not present

## 2017-05-23 DIAGNOSIS — I482 Chronic atrial fibrillation: Secondary | ICD-10-CM | POA: Diagnosis not present

## 2017-05-23 DIAGNOSIS — N189 Chronic kidney disease, unspecified: Secondary | ICD-10-CM | POA: Diagnosis not present

## 2017-05-23 DIAGNOSIS — M1990 Unspecified osteoarthritis, unspecified site: Secondary | ICD-10-CM | POA: Diagnosis not present

## 2017-05-23 LAB — CUP PACEART REMOTE DEVICE CHECK
Battery Remaining Longevity: 33 mo
Battery Voltage: 2.93 V
Brady Statistic AP VP Percent: 0 %
Brady Statistic AP VS Percent: 0 %
Brady Statistic AS VP Percent: 0 %
Brady Statistic RV Percent Paced: 99.98 %
Date Time Interrogation Session: 20190122203333
Implantable Lead Implant Date: 20120113
Implantable Lead Location: 753859
Implantable Lead Location: 753860
Implantable Lead Model: 4196
Lead Channel Impedance Value: 266 Ohm
Lead Channel Impedance Value: 342 Ohm
Lead Channel Impedance Value: 4047 Ohm
Lead Channel Impedance Value: 4047 Ohm
Lead Channel Impedance Value: 570 Ohm
Lead Channel Pacing Threshold Amplitude: 0.875 V
Lead Channel Pacing Threshold Pulse Width: 0.4 ms
Lead Channel Pacing Threshold Pulse Width: 0.4 ms
Lead Channel Sensing Intrinsic Amplitude: 0.5 mV
Lead Channel Sensing Intrinsic Amplitude: 1.125 mV
Lead Channel Setting Pacing Pulse Width: 0.4 ms
Lead Channel Setting Pacing Pulse Width: 0.4 ms
Lead Channel Setting Sensing Sensitivity: 0.9 mV
MDC IDC LEAD IMPLANT DT: 20120113
MDC IDC LEAD IMPLANT DT: 20120113
MDC IDC LEAD LOCATION: 753858
MDC IDC MSMT LEADCHNL LV IMPEDANCE VALUE: 399 Ohm
MDC IDC MSMT LEADCHNL LV IMPEDANCE VALUE: 4047 Ohm
MDC IDC MSMT LEADCHNL LV PACING THRESHOLD AMPLITUDE: 1.125 V
MDC IDC MSMT LEADCHNL LV PACING THRESHOLD PULSEWIDTH: 0.4 ms
MDC IDC MSMT LEADCHNL RA IMPEDANCE VALUE: 399 Ohm
MDC IDC MSMT LEADCHNL RV IMPEDANCE VALUE: 475 Ohm
MDC IDC MSMT LEADCHNL RV PACING THRESHOLD AMPLITUDE: 0.75 V
MDC IDC MSMT LEADCHNL RV SENSING INTR AMPL: 4.25 mV
MDC IDC MSMT LEADCHNL RV SENSING INTR AMPL: 4.25 mV
MDC IDC PG IMPLANT DT: 20111019
MDC IDC SET LEADCHNL LV PACING AMPLITUDE: 1.75 V
MDC IDC SET LEADCHNL RV PACING AMPLITUDE: 2 V
MDC IDC STAT BRADY AS VS PERCENT: 0 %
MDC IDC STAT BRADY RA PERCENT PACED: 0 %

## 2017-05-26 DIAGNOSIS — M25551 Pain in right hip: Secondary | ICD-10-CM | POA: Diagnosis not present

## 2017-05-26 DIAGNOSIS — I482 Chronic atrial fibrillation: Secondary | ICD-10-CM | POA: Diagnosis not present

## 2017-05-26 DIAGNOSIS — I129 Hypertensive chronic kidney disease with stage 1 through stage 4 chronic kidney disease, or unspecified chronic kidney disease: Secondary | ICD-10-CM | POA: Diagnosis not present

## 2017-05-26 DIAGNOSIS — E1122 Type 2 diabetes mellitus with diabetic chronic kidney disease: Secondary | ICD-10-CM | POA: Diagnosis not present

## 2017-05-26 DIAGNOSIS — M1990 Unspecified osteoarthritis, unspecified site: Secondary | ICD-10-CM | POA: Diagnosis not present

## 2017-05-26 DIAGNOSIS — N189 Chronic kidney disease, unspecified: Secondary | ICD-10-CM | POA: Diagnosis not present

## 2017-05-28 DIAGNOSIS — M25551 Pain in right hip: Secondary | ICD-10-CM | POA: Diagnosis not present

## 2017-05-28 DIAGNOSIS — N189 Chronic kidney disease, unspecified: Secondary | ICD-10-CM | POA: Diagnosis not present

## 2017-05-28 DIAGNOSIS — I482 Chronic atrial fibrillation: Secondary | ICD-10-CM | POA: Diagnosis not present

## 2017-05-28 DIAGNOSIS — I129 Hypertensive chronic kidney disease with stage 1 through stage 4 chronic kidney disease, or unspecified chronic kidney disease: Secondary | ICD-10-CM | POA: Diagnosis not present

## 2017-05-28 DIAGNOSIS — E1122 Type 2 diabetes mellitus with diabetic chronic kidney disease: Secondary | ICD-10-CM | POA: Diagnosis not present

## 2017-05-28 DIAGNOSIS — M1990 Unspecified osteoarthritis, unspecified site: Secondary | ICD-10-CM | POA: Diagnosis not present

## 2017-05-30 DIAGNOSIS — M1990 Unspecified osteoarthritis, unspecified site: Secondary | ICD-10-CM | POA: Diagnosis not present

## 2017-05-30 DIAGNOSIS — N189 Chronic kidney disease, unspecified: Secondary | ICD-10-CM | POA: Diagnosis not present

## 2017-05-30 DIAGNOSIS — M25551 Pain in right hip: Secondary | ICD-10-CM | POA: Diagnosis not present

## 2017-05-30 DIAGNOSIS — E1122 Type 2 diabetes mellitus with diabetic chronic kidney disease: Secondary | ICD-10-CM | POA: Diagnosis not present

## 2017-05-30 DIAGNOSIS — I482 Chronic atrial fibrillation: Secondary | ICD-10-CM | POA: Diagnosis not present

## 2017-05-30 DIAGNOSIS — I129 Hypertensive chronic kidney disease with stage 1 through stage 4 chronic kidney disease, or unspecified chronic kidney disease: Secondary | ICD-10-CM | POA: Diagnosis not present

## 2017-06-02 DIAGNOSIS — I482 Chronic atrial fibrillation: Secondary | ICD-10-CM | POA: Diagnosis not present

## 2017-06-02 DIAGNOSIS — N189 Chronic kidney disease, unspecified: Secondary | ICD-10-CM | POA: Diagnosis not present

## 2017-06-02 DIAGNOSIS — M1990 Unspecified osteoarthritis, unspecified site: Secondary | ICD-10-CM | POA: Diagnosis not present

## 2017-06-02 DIAGNOSIS — I129 Hypertensive chronic kidney disease with stage 1 through stage 4 chronic kidney disease, or unspecified chronic kidney disease: Secondary | ICD-10-CM | POA: Diagnosis not present

## 2017-06-02 DIAGNOSIS — M25551 Pain in right hip: Secondary | ICD-10-CM | POA: Diagnosis not present

## 2017-06-02 DIAGNOSIS — E1122 Type 2 diabetes mellitus with diabetic chronic kidney disease: Secondary | ICD-10-CM | POA: Diagnosis not present

## 2017-06-04 DIAGNOSIS — I129 Hypertensive chronic kidney disease with stage 1 through stage 4 chronic kidney disease, or unspecified chronic kidney disease: Secondary | ICD-10-CM | POA: Diagnosis not present

## 2017-06-04 DIAGNOSIS — M25551 Pain in right hip: Secondary | ICD-10-CM | POA: Diagnosis not present

## 2017-06-04 DIAGNOSIS — I482 Chronic atrial fibrillation: Secondary | ICD-10-CM | POA: Diagnosis not present

## 2017-06-04 DIAGNOSIS — N189 Chronic kidney disease, unspecified: Secondary | ICD-10-CM | POA: Diagnosis not present

## 2017-06-04 DIAGNOSIS — E1122 Type 2 diabetes mellitus with diabetic chronic kidney disease: Secondary | ICD-10-CM | POA: Diagnosis not present

## 2017-06-04 DIAGNOSIS — M1990 Unspecified osteoarthritis, unspecified site: Secondary | ICD-10-CM | POA: Diagnosis not present

## 2017-06-06 DIAGNOSIS — N189 Chronic kidney disease, unspecified: Secondary | ICD-10-CM | POA: Diagnosis not present

## 2017-06-06 DIAGNOSIS — M25551 Pain in right hip: Secondary | ICD-10-CM | POA: Diagnosis not present

## 2017-06-06 DIAGNOSIS — M1990 Unspecified osteoarthritis, unspecified site: Secondary | ICD-10-CM | POA: Diagnosis not present

## 2017-06-06 DIAGNOSIS — I482 Chronic atrial fibrillation: Secondary | ICD-10-CM | POA: Diagnosis not present

## 2017-06-06 DIAGNOSIS — I129 Hypertensive chronic kidney disease with stage 1 through stage 4 chronic kidney disease, or unspecified chronic kidney disease: Secondary | ICD-10-CM | POA: Diagnosis not present

## 2017-06-06 DIAGNOSIS — E1122 Type 2 diabetes mellitus with diabetic chronic kidney disease: Secondary | ICD-10-CM | POA: Diagnosis not present

## 2017-06-09 DIAGNOSIS — M25551 Pain in right hip: Secondary | ICD-10-CM | POA: Diagnosis not present

## 2017-06-09 DIAGNOSIS — E1122 Type 2 diabetes mellitus with diabetic chronic kidney disease: Secondary | ICD-10-CM | POA: Diagnosis not present

## 2017-06-09 DIAGNOSIS — I482 Chronic atrial fibrillation: Secondary | ICD-10-CM | POA: Diagnosis not present

## 2017-06-09 DIAGNOSIS — M1990 Unspecified osteoarthritis, unspecified site: Secondary | ICD-10-CM | POA: Diagnosis not present

## 2017-06-09 DIAGNOSIS — N189 Chronic kidney disease, unspecified: Secondary | ICD-10-CM | POA: Diagnosis not present

## 2017-06-09 DIAGNOSIS — I129 Hypertensive chronic kidney disease with stage 1 through stage 4 chronic kidney disease, or unspecified chronic kidney disease: Secondary | ICD-10-CM | POA: Diagnosis not present

## 2017-06-10 ENCOUNTER — Other Ambulatory Visit: Payer: Self-pay

## 2017-06-10 ENCOUNTER — Encounter (HOSPITAL_COMMUNITY): Payer: Self-pay

## 2017-06-10 ENCOUNTER — Emergency Department (HOSPITAL_COMMUNITY): Payer: Medicare HMO

## 2017-06-10 ENCOUNTER — Emergency Department (HOSPITAL_COMMUNITY)
Admission: EM | Admit: 2017-06-10 | Discharge: 2017-06-11 | Disposition: A | Discharge status: Home / Self Care | Payer: Medicare HMO | Attending: Emergency Medicine | Admitting: Emergency Medicine

## 2017-06-10 DIAGNOSIS — I4510 Unspecified right bundle-branch block: Secondary | ICD-10-CM | POA: Diagnosis not present

## 2017-06-10 DIAGNOSIS — N189 Chronic kidney disease, unspecified: Secondary | ICD-10-CM | POA: Diagnosis not present

## 2017-06-10 DIAGNOSIS — R42 Dizziness and giddiness: Secondary | ICD-10-CM | POA: Diagnosis present

## 2017-06-10 DIAGNOSIS — M25551 Pain in right hip: Secondary | ICD-10-CM | POA: Diagnosis not present

## 2017-06-10 DIAGNOSIS — I129 Hypertensive chronic kidney disease with stage 1 through stage 4 chronic kidney disease, or unspecified chronic kidney disease: Secondary | ICD-10-CM | POA: Insufficient documentation

## 2017-06-10 DIAGNOSIS — E119 Type 2 diabetes mellitus without complications: Secondary | ICD-10-CM | POA: Insufficient documentation

## 2017-06-10 DIAGNOSIS — Z043 Encounter for examination and observation following other accident: Secondary | ICD-10-CM | POA: Insufficient documentation

## 2017-06-10 DIAGNOSIS — R404 Transient alteration of awareness: Secondary | ICD-10-CM | POA: Diagnosis not present

## 2017-06-10 DIAGNOSIS — Z79899 Other long term (current) drug therapy: Secondary | ICD-10-CM | POA: Insufficient documentation

## 2017-06-10 DIAGNOSIS — Z7901 Long term (current) use of anticoagulants: Secondary | ICD-10-CM | POA: Insufficient documentation

## 2017-06-10 DIAGNOSIS — S79911A Unspecified injury of right hip, initial encounter: Secondary | ICD-10-CM | POA: Diagnosis not present

## 2017-06-10 DIAGNOSIS — W19XXXA Unspecified fall, initial encounter: Secondary | ICD-10-CM

## 2017-06-10 DIAGNOSIS — R531 Weakness: Secondary | ICD-10-CM | POA: Diagnosis not present

## 2017-06-10 LAB — BASIC METABOLIC PANEL
Anion gap: 8 (ref 5–15)
BUN: 17 mg/dL (ref 6–20)
CALCIUM: 8.4 mg/dL — AB (ref 8.9–10.3)
CHLORIDE: 108 mmol/L (ref 101–111)
CO2: 20 mmol/L — ABNORMAL LOW (ref 22–32)
CREATININE: 1.16 mg/dL (ref 0.61–1.24)
GFR calc Af Amer: >60 mL/min (ref >60)
GFR, EST NON AFRICAN AMERICAN: 54 mL/min — AB (ref >60)
Glucose, Bld: 120 mg/dL — ABNORMAL HIGH (ref 65–99)
Potassium: 4.9 mmol/L (ref 3.5–5.1)
SODIUM: 136 mmol/L (ref 135–145)

## 2017-06-10 LAB — CBC WITH DIFFERENTIAL/PLATELET
Basophils Absolute: 0 10*3/uL (ref 0.0–0.1)
Basophils Relative: 1 %
Eosinophils Absolute: 0.3 10*3/uL (ref 0.0–0.7)
Eosinophils Relative: 9 %
HCT: 28.2 % — ABNORMAL LOW (ref 39.0–52.0)
HEMOGLOBIN: 9 g/dL — AB (ref 13.0–17.0)
LYMPHS ABS: 1.1 10*3/uL (ref 0.7–4.0)
LYMPHS PCT: 38 %
MCH: 33.3 pg (ref 26.0–34.0)
MCHC: 31.9 g/dL (ref 30.0–36.0)
MCV: 104.4 fL — ABNORMAL HIGH (ref 78.0–100.0)
MONOS PCT: 13 %
Monocytes Absolute: 0.4 10*3/uL (ref 0.1–1.0)
NEUTROS PCT: 39 %
Neutro Abs: 1.1 10*3/uL — ABNORMAL LOW (ref 1.7–7.7)
Platelets: 111 10*3/uL — ABNORMAL LOW (ref 150–400)
RBC: 2.7 MIL/uL — ABNORMAL LOW (ref 4.22–5.81)
RDW: 14.4 % (ref 11.5–15.5)
WBC: 2.9 10*3/uL — ABNORMAL LOW (ref 4.0–10.5)

## 2017-06-10 LAB — PROTIME-INR
INR: 3.3
PROTHROMBIN TIME: 33.3 s — AB (ref 11.4–15.2)

## 2017-06-10 NOTE — ED Provider Notes (Signed)
[facility] EMERGENCY DEPARTMENT Provider Note   CSN: [id number] Arrival date & time: 06/10/17  2228     History   Chief Complaint Chief Complaint  Patient presents with  . Fall    HPI   Blood pressure (!) 144/58, temperature 97.9 F (36.6 C), temperature source Oral, resp. rate 16, height 6\' 1"  (1.854 m), weight 65.8 kg (145 lb), SpO2 100 %.  Joseph Watts is a 84 y.o. male with past medical history significant for permanent A. fib, anticoagulated with Coumadin, complete heart block with pacemaker complaining of dizziness when bending over tonight, he states he fell onto his right arm.  There was no head trauma, he did not lose consciousness there is no associated chest pain or palpitations he was transported by EMS after much encouragement.  Patient had first Procrit shot within the last few weeks.  He likely has AML but is declining aggressive treatment.  Patient lives at home with his disabled wife.  He has home health 3 times a week for 3 hours, history is partially provided by his daughter who states that he has been falling 4 times in the last 4 months.  He is getting physical therapy at home but he does not think that that has been helpful to him.  Patient declines any consideration of nursing home placement.  No melena, hematochezia, abdominal pain.  Patient denies any pain at this time.   Past Medical History:  Diagnosis Date  . Aortic valve replaced    s/p TAVR  . BPH (benign prostatic hyperplasia)   . Cardiomyopathy (HCC)   . Complete heart block (HCC)   . Diabetes mellitus   . Hypercholesteremia   . Hyperlipemia   . Hypertension   . Iron deficiency anemia   . Permanent atrial fibrillation [facility]     Patient Active Problem List   Diagnosis Date Noted  . Cellulitis of right hand 12/28/2016  . Fall 12/28/2016  . Pancytopenia (HCC) 12/28/2016  . Cellulitis 12/27/2016  . Complete heart block (HCC) 07/13/2015  . Permanent atrial fibrillation  (HCC) 07/13/2015  . Essential hypertension 07/13/2015  . Anemia of other chronic disease 12/01/2012  . Hypercholesteremia   . Aortic valve replaced   . Chronic systolic heart failure (HCC) 08/26/2010  . Aortic insufficiency 08/26/2010  . Diabetes mellitus 08/26/2010  . CKD (chronic kidney disease) 08/26/2010  . Glaucoma 08/26/2010  . Status post biventricular cardiac pacemaker procedure 08/26/2010  . Hx of CABG 08/26/2010    Past Surgical History:  Procedure Laterality Date  . PACEMAKER INSERTION  05/12/10   MDT Consult CRT-P implanted at [facility] by Dr Chryl Heck  . TRANSCATHETER AORTIC VALVE REPLACEMENT, TRANSAORTIC  2012   [facility]       Home Medications    Prior to Admission medications   Medication Sig Start Date End Date Taking? Authorizing Provider  carvedilol (COREG) 25 MG tablet Take 25 mg by mouth 2 (two) times daily with a meal.   Yes [provider]  dorzolamide-timolol (COSOPT) 22.3-6.8 MG/ML ophthalmic solution Place 1 drop into both eyes 2 (two) times daily.   Yes [provider]  epoetin alfa (EPOGEN,PROCRIT) 13086 UNIT/ML injection Inject 20,000 Units into the vein every 14 (fourteen) days.   Yes [provider]  ferrous sulfate 325 (65 FE) MG tablet Take 162.5 mg by mouth daily with breakfast.  09/03/11  Yes Si Gaul, MD  finasteride (PROSCAR) 5 MG tablet Take 5 mg by mouth daily.     Yes  [provider]  fluocinonide (LIDEX) 0.05 % ointment Apply 1 application topically daily as needed (skin care, irritation, dryness).    Yes [provider]  fluticasone (FLONASE) 50 MCG/ACT nasal spray Place 2 sprays into the nose daily as needed for allergies.    Yes [provider]  furosemide (LASIX) 20 MG tablet Take 1 tablet (20 mg total) by mouth daily as needed for fluid. 12/31/16  Yes Tyrone Nine, MD  gabapentin (NEURONTIN) 100 MG capsule Take 100 mg by mouth See admin instructions. At bedtime on Sunday, Tuesday, Thursday  and Saturday   Yes [provider]  latanoprost (XALATAN) 0.005 % ophthalmic solution Place 1 drop into both eyes at bedtime.     Yes [provider]  loratadine (CLARITIN) 10 MG tablet Take 10 mg by mouth daily.   Yes [provider]  mirtazapine (REMERON) 15 MG tablet Take 15 mg by mouth at bedtime.   Yes [provider]  Multiple Vitamins-Minerals (PRESERVISION AREDS PO) Take 1 tablet by mouth daily.   Yes [provider]  polyethylene glycol powder (GLYCOLAX/MIRALAX) powder Take 17 g by mouth daily as needed for mild constipation.   Yes [provider]  Tamsulosin HCl (FLOMAX) 0.4 MG CAPS Take 0.4 mg by mouth at bedtime.    Yes [provider]  triamcinolone cream (KENALOG) 0.1 % Apply 1 application topically daily as needed (itching).    Yes [provider]  VITAMIN K PO Take 100 mcg by mouth daily.    Yes [provider]  warfarin (COUMADIN) 2 MG tablet Take 2-3 mg by mouth See admin instructions. Take 1 tablet on Wednesday then take 1 and 1/2 tablets all other days   Yes [provider]    Family History Family History  Problem Relation Age of Onset  . Other Mother        health hx unknown  . Diabetes Father   . Prostate cancer Father   . Hypertension Unknown   . Skin cancer Brother     Social History Social History   Tobacco Use  . Smoking status: Never Smoker  . Smokeless tobacco: Never Used  Substance Use Topics  . Alcohol use: No  . Drug use: No     Allergies   Motrin [ibuprofen]   Review of Systems Review of Systems   Physical Exam Updated Vital Signs BP (!) 150/58   Pulse 70   Temp 97.9 F (36.6 C) (Oral)   Resp 18   Ht 6\' 1"  (1.854 m)   Wt 65.8 kg (145 lb)   SpO2 98%   BMI 19.13 kg/m   Physical Exam  Constitutional: He is oriented to person, place, and time. No distress.  Pale, frail  HENT:  Head: Normocephalic and atraumatic.  Mouth/Throat: Oropharynx  is clear and moist.  Eyes: Conjunctivae and EOM are normal. Pupils are equal, round, and reactive to light.  Neck: Normal range of motion.  Cardiovascular: Normal rate, regular rhythm and intact distal pulses.  Pulmonary/Chest: Effort normal and breath sounds normal.  Abdominal: Soft. There is no tenderness.  Musculoskeletal: Normal range of motion.  Neurological: He is alert and oriented to person, place, and time.  Skin: He is not diaphoretic.  Psychiatric: He has a normal mood and affect.  Nursing note and vitals reviewed.    ED Treatments / Results  Labs (all labs ordered are listed, but only abnormal results are displayed) Labs Reviewed  BASIC METABOLIC PANEL - Abnormal;  Notable for the following components:      Result Value   CO2 20 (*)    Glucose, Bld 120 (*)    Calcium 8.4 (*)    GFR calc non Af Amer 54 (*)    All other components within normal limits  CBC WITH DIFFERENTIAL/PLATELET - Abnormal; Notable for the following components:   WBC 2.9 (*)    RBC 2.70 (*)    Hemoglobin 9.0 (*)    HCT 28.2 (*)    MCV 104.4 (*)    Platelets 111 (*)    Neutro Abs 1.1 (*)    All other components within normal limits  PROTIME-INR - Abnormal; Notable for the following components:   Prothrombin Time 33.3 (*)    All other components within normal limits  URINALYSIS, ROUTINE W REFLEX MICROSCOPIC    EKG  EKG Interpretation  Date/Time:  Tuesday June 10 2017 22:44:49 EST Ventricular Rate:  70 PR Interval:    QRS Duration: 166 QT Interval:  497 QTC Calculation: 537 R Axis:   15 Text Interpretation:  PAced rhythm Right bundle branch block LVH by voltage Prolonged QT interval When compared to prior, no significant changes seen. Paced.  No STEMI Confirmed by Theda Belfast ([id number]) on 06/11/2017 12:31:16 AM       Radiology Dg Hip Unilat W Or Wo Pelvis 2-3 Views Right  Result Date: 06/11/2017 CLINICAL DATA:  Patient fell onto carpeted floor.  Pain. EXAM: DG HIP (WITH OR  WITHOUT PELVIS) 2-3V RIGHT COMPARISON:  05/01/2017 FINDINGS: Bones are demineralized.  Overlying bowel limits fine bony detail. There is no evidence of acute hip fracture or dislocation. Chronic right superior and inferior pubic rami fractures. Lower lumbar facet arthropathy. Bi-iliofemoral atherosclerotic calcifications. IMPRESSION: Chronic right superior and inferior pubic rami fractures. No acute osseous abnormality of the pelvis and right hip. Electronically Signed   By: Tollie Eth M.D.   On: 06/11/2017 00:29    Procedures Procedures (including critical care time)  Medications Ordered in ED Medications - No data to display   Initial Impression / Assessment and Plan / ED Course  I have reviewed the triage vital signs and the nursing notes.  Pertinent labs & imaging results that were available during my care of the patient were reviewed by me and considered in my medical decision making (see chart for details).     Vitals:   06/10/17 2239 06/10/17 2315 06/10/17 2345 06/11/17 0030  BP:  (!) 153/50 (!) 152/54 (!) 150/58  Pulse:  70 70 70  Resp:  (!) 21 (!) 21 18  Temp:      TempSrc:      SpO2:  98% 98% 98%  Weight: 65.8 kg (145 lb)     Height: 6\' 1"  (1.854 m)       Joseph Watts is 84 y.o. male presenting with fall at home onto left hip.  He is not reporting any pain.  No shortening or rotation of the extremity.  He is anticoagulated but he did not hit his head, he is mentating at his baseline as per his daughter.  EKG with no arrhythmia, paced rhythm.  He is not reporting any chest pain.  Blood work with pancytopenia consistent with his leukemia which she has opted to not treat aggressively.  I have offered this patient placement and he declines.  Plan to follow-up urinalysis and ambulate him and anticipate discharge to home.  Case signed out to PA Upstill at shift change.   Final Clinical Impressions(s) /  ED Diagnoses   Final diagnoses:  None    ED Discharge Orders    None         Lynetta Mare Mardella Layman 06/11/17 0120    Tegeler, Canary Brim, MD 06/11/17 [phone]

## 2017-06-10 NOTE — ED Notes (Signed)
ED Provider at bedside. 

## 2017-06-10 NOTE — ED Notes (Signed)
Patient transported to X-ray 

## 2017-06-10 NOTE — ED Triage Notes (Addendum)
Per [facility], pt standing and bent over to pick something up, became dizzy and fell. Pt denies LOC or hitting head. Pt reports falling on right shoulder and hip. Pt on warfarin. Pt denies pain or headache. Pt is alert and oriented. Pt BP sitting 120/60 and had 20 point drop when he stood. Pt's doctors think he has leukemia, but pt refuses further testing. Pt has 18 G RFA. Pt does not know what brand of pacemaker he has. Pt lives at home with wife and daughter is neighbor.

## 2017-06-11 DIAGNOSIS — E1122 Type 2 diabetes mellitus with diabetic chronic kidney disease: Secondary | ICD-10-CM | POA: Diagnosis not present

## 2017-06-11 DIAGNOSIS — N189 Chronic kidney disease, unspecified: Secondary | ICD-10-CM | POA: Diagnosis not present

## 2017-06-11 DIAGNOSIS — S4990XA Unspecified injury of shoulder and upper arm, unspecified arm, initial encounter: Secondary | ICD-10-CM | POA: Diagnosis not present

## 2017-06-11 DIAGNOSIS — I129 Hypertensive chronic kidney disease with stage 1 through stage 4 chronic kidney disease, or unspecified chronic kidney disease: Secondary | ICD-10-CM | POA: Diagnosis not present

## 2017-06-11 DIAGNOSIS — G8911 Acute pain due to trauma: Secondary | ICD-10-CM | POA: Diagnosis not present

## 2017-06-11 DIAGNOSIS — I482 Chronic atrial fibrillation: Secondary | ICD-10-CM | POA: Diagnosis not present

## 2017-06-11 DIAGNOSIS — M25551 Pain in right hip: Secondary | ICD-10-CM | POA: Diagnosis not present

## 2017-06-11 DIAGNOSIS — M1990 Unspecified osteoarthritis, unspecified site: Secondary | ICD-10-CM | POA: Diagnosis not present

## 2017-06-11 LAB — URINALYSIS, ROUTINE W REFLEX MICROSCOPIC
Bilirubin Urine: NEGATIVE
Glucose, UA: NEGATIVE mg/dL
Hgb urine dipstick: NEGATIVE
Ketones, ur: NEGATIVE mg/dL
LEUKOCYTES UA: NEGATIVE
NITRITE: NEGATIVE
PH: 5 (ref 5.0–8.0)
Protein, ur: NEGATIVE mg/dL
SPECIFIC GRAVITY, URINE: 1.016 (ref 1.005–1.030)

## 2017-06-11 NOTE — ED Notes (Signed)
This RN confirmed with operator [phone] of [facility] that they will be picking up pt.

## 2017-06-11 NOTE — Discharge Instructions (Signed)
You can be discharged home tonight. Please follow up with your doctor as needed. Return to the emergency department with any new concerns.   Results for orders placed or performed during the hospital encounter of 06/10/17  Basic metabolic panel  Result Value Ref Range   Sodium 136 135 - 145 mmol/L   Potassium 4.9 3.5 - 5.1 mmol/L   Chloride 108 101 - 111 mmol/L   CO2 20 (L) 22 - 32 mmol/L   Glucose, Bld 120 (H) 65 - 99 mg/dL   BUN 17 6 - 20 mg/dL   Creatinine, Ser 4.58 0.61 - 1.24 mg/dL   Calcium 8.4 (L) 8.9 - 10.3 mg/dL   GFR calc non Af Amer 54 (L) >60 mL/min   GFR calc Af Amer >60 >60 mL/min   Anion gap 8 5 - 15  Urinalysis, Routine w reflex microscopic  Result Value Ref Range   Color, Urine YELLOW YELLOW   APPearance CLEAR CLEAR   Specific Gravity, Urine 1.016 1.005 - 1.030   pH 5.0 5.0 - 8.0   Glucose, UA NEGATIVE NEGATIVE mg/dL   Hgb urine dipstick NEGATIVE NEGATIVE   Bilirubin Urine NEGATIVE NEGATIVE   Ketones, ur NEGATIVE NEGATIVE mg/dL   Protein, ur NEGATIVE NEGATIVE mg/dL   Nitrite NEGATIVE NEGATIVE   Leukocytes, UA NEGATIVE NEGATIVE  CBC with Differential  Result Value Ref Range   WBC 2.9 (L) 4.0 - 10.5 K/uL   RBC 2.70 (L) 4.22 - 5.81 MIL/uL   Hemoglobin 9.0 (L) 13.0 - 17.0 g/dL   HCT 59.2 (L) 92.4 - 46.2 %   MCV 104.4 (H) 78.0 - 100.0 fL   MCH 33.3 26.0 - 34.0 pg   MCHC 31.9 30.0 - 36.0 g/dL   RDW 86.3 81.7 - 71.1 %   Platelets 111 (L) 150 - 400 K/uL   Neutrophils Relative % 39 %   Neutro Abs 1.1 (L) 1.7 - 7.7 K/uL   Lymphocytes Relative 38 %   Lymphs Abs 1.1 0.7 - 4.0 K/uL   Monocytes Relative 13 %   Monocytes Absolute 0.4 0.1 - 1.0 K/uL   Eosinophils Relative 9 %   Eosinophils Absolute 0.3 0.0 - 0.7 K/uL   Basophils Relative 1 %   Basophils Absolute 0.0 0.0 - 0.1 K/uL  Protime-INR  Result Value Ref Range   Prothrombin Time 33.3 (H) 11.4 - 15.2 seconds   INR 3.30

## 2017-06-11 NOTE — ED Provider Notes (Signed)
UA, ambulate, anticipate discharge  Patient signed out at end of shift by Wynetta Emery, PA-C, pending review of UA and ambulation to determine ability and safety for discharge.   The patient's UA is negative for infection. He is ambulatory with a walker, as he is at home, and has no difficulty. No assistance required. He is felt appropriate for discharge home.    Joseph Anis, PA-C 06/11/17 2103    Tegeler, Canary Brim, MD 06/11/17 [phone]

## 2017-06-11 NOTE — ED Notes (Signed)
This RN notified Melvenia Beam, PA that pt walked with walker with no complaints.

## 2017-06-13 DIAGNOSIS — M1990 Unspecified osteoarthritis, unspecified site: Secondary | ICD-10-CM | POA: Diagnosis not present

## 2017-06-13 DIAGNOSIS — I129 Hypertensive chronic kidney disease with stage 1 through stage 4 chronic kidney disease, or unspecified chronic kidney disease: Secondary | ICD-10-CM | POA: Diagnosis not present

## 2017-06-13 DIAGNOSIS — N189 Chronic kidney disease, unspecified: Secondary | ICD-10-CM | POA: Diagnosis not present

## 2017-06-13 DIAGNOSIS — I482 Chronic atrial fibrillation: Secondary | ICD-10-CM | POA: Diagnosis not present

## 2017-06-13 DIAGNOSIS — E1122 Type 2 diabetes mellitus with diabetic chronic kidney disease: Secondary | ICD-10-CM | POA: Diagnosis not present

## 2017-06-13 DIAGNOSIS — M25551 Pain in right hip: Secondary | ICD-10-CM | POA: Diagnosis not present

## 2017-06-17 ENCOUNTER — Encounter: Payer: Self-pay | Admitting: Cardiology

## 2017-06-18 DIAGNOSIS — M1990 Unspecified osteoarthritis, unspecified site: Secondary | ICD-10-CM | POA: Diagnosis not present

## 2017-06-18 DIAGNOSIS — I129 Hypertensive chronic kidney disease with stage 1 through stage 4 chronic kidney disease, or unspecified chronic kidney disease: Secondary | ICD-10-CM | POA: Diagnosis not present

## 2017-06-18 DIAGNOSIS — M25551 Pain in right hip: Secondary | ICD-10-CM | POA: Diagnosis not present

## 2017-06-18 DIAGNOSIS — I482 Chronic atrial fibrillation: Secondary | ICD-10-CM | POA: Diagnosis not present

## 2017-06-18 DIAGNOSIS — E1122 Type 2 diabetes mellitus with diabetic chronic kidney disease: Secondary | ICD-10-CM | POA: Diagnosis not present

## 2017-06-18 DIAGNOSIS — N189 Chronic kidney disease, unspecified: Secondary | ICD-10-CM | POA: Diagnosis not present

## 2017-06-19 ENCOUNTER — Telehealth: Payer: Self-pay

## 2017-06-19 ENCOUNTER — Ambulatory Visit: Payer: Medicare HMO | Admitting: Cardiology

## 2017-06-19 NOTE — Telephone Encounter (Signed)
Return call to daughter, [name].  She wanted to check if ICD report was receiving.  Advised it was and no fluid symptom showed on report.  She said he is eating better and weight is 141 lbs today.  He has had 3 falls this year, last one was 06/10/2017.  See ICM note.  No changes today.  Next remote transmission 08/19/2017

## 2017-06-20 ENCOUNTER — Ambulatory Visit (INDEPENDENT_AMBULATORY_CARE_PROVIDER_SITE_OTHER): Payer: Medicare HMO

## 2017-06-20 DIAGNOSIS — Z950 Presence of cardiac pacemaker: Secondary | ICD-10-CM

## 2017-06-20 DIAGNOSIS — I5022 Chronic systolic (congestive) heart failure: Secondary | ICD-10-CM | POA: Diagnosis not present

## 2017-06-20 DIAGNOSIS — I482 Chronic atrial fibrillation: Secondary | ICD-10-CM | POA: Diagnosis not present

## 2017-06-20 DIAGNOSIS — M1990 Unspecified osteoarthritis, unspecified site: Secondary | ICD-10-CM | POA: Diagnosis not present

## 2017-06-20 DIAGNOSIS — E1122 Type 2 diabetes mellitus with diabetic chronic kidney disease: Secondary | ICD-10-CM | POA: Diagnosis not present

## 2017-06-20 DIAGNOSIS — I129 Hypertensive chronic kidney disease with stage 1 through stage 4 chronic kidney disease, or unspecified chronic kidney disease: Secondary | ICD-10-CM | POA: Diagnosis not present

## 2017-06-20 DIAGNOSIS — N189 Chronic kidney disease, unspecified: Secondary | ICD-10-CM | POA: Diagnosis not present

## 2017-06-20 DIAGNOSIS — M25551 Pain in right hip: Secondary | ICD-10-CM | POA: Diagnosis not present

## 2017-06-20 NOTE — Progress Notes (Signed)
EPIC Encounter for ICM Monitoring  Patient Name: Joseph Watts is a 84 y.o. male Date: 06/20/2017 Primary Care Physican: Velna Hatchet, MD Primary Cardiologist: [facility] Electrophysiologist: Allred Dry Weight:141 lbs Bi-V Pacing: 99.9%       Late entry for 06/19/2017 call from daughter, [place].  Heart Failure questions reviewed, pt asymptomatic.  Patient has had 3 falls in 2019 with last one being 2/12.   Thoracic impedance normal.  Prescribed dosage: Furosemide 20 mg 1 tablet daily as needed for swelling.   Labs: 02/24/2017 Creatinine 1.30, BUN 33, Potassium 4.8, Sodium 136, EGFR 49-56 12/30/2016 Creatinine 1.34, BUN 33, Potassium 3.8, Sodium 137, EGFR 46-53 12/29/2016 Creatinine 1.44, BUN 38, Potassium 4.3, Sodium 137, EGFR 42-48  12/28/2016 Creatinine 1.27, BUN 32, Potassium 4.8, Sodium 137, EGFR 49-56  12/27/2016 Creatinine 1.28, BUN 28, Potassium 5.1, Sodium 135, EGFR 48-56  Recommendations: No changes.   Encouraged to call for fluid symptoms.  Follow-up plan: ICM clinic phone appointment on 08/19/2017.  Office appointment scheduled 07/10/2017 with Tommye Standard, PA.  Copy of ICM check sent to Dr. Rayann Heman.   3 month ICM trend: 06/19/2017    1 Year ICM trend:       Rosalene Billings, RN 06/20/2017 9:09 AM

## 2017-06-23 DIAGNOSIS — E1122 Type 2 diabetes mellitus with diabetic chronic kidney disease: Secondary | ICD-10-CM | POA: Diagnosis not present

## 2017-06-23 DIAGNOSIS — M25551 Pain in right hip: Secondary | ICD-10-CM | POA: Diagnosis not present

## 2017-06-23 DIAGNOSIS — N189 Chronic kidney disease, unspecified: Secondary | ICD-10-CM | POA: Diagnosis not present

## 2017-06-23 DIAGNOSIS — I482 Chronic atrial fibrillation: Secondary | ICD-10-CM | POA: Diagnosis not present

## 2017-06-23 DIAGNOSIS — I129 Hypertensive chronic kidney disease with stage 1 through stage 4 chronic kidney disease, or unspecified chronic kidney disease: Secondary | ICD-10-CM | POA: Diagnosis not present

## 2017-06-23 DIAGNOSIS — M1990 Unspecified osteoarthritis, unspecified site: Secondary | ICD-10-CM | POA: Diagnosis not present

## 2017-06-30 DIAGNOSIS — E1122 Type 2 diabetes mellitus with diabetic chronic kidney disease: Secondary | ICD-10-CM | POA: Diagnosis not present

## 2017-06-30 DIAGNOSIS — M1990 Unspecified osteoarthritis, unspecified site: Secondary | ICD-10-CM | POA: Diagnosis not present

## 2017-06-30 DIAGNOSIS — I129 Hypertensive chronic kidney disease with stage 1 through stage 4 chronic kidney disease, or unspecified chronic kidney disease: Secondary | ICD-10-CM | POA: Diagnosis not present

## 2017-06-30 DIAGNOSIS — N189 Chronic kidney disease, unspecified: Secondary | ICD-10-CM | POA: Diagnosis not present

## 2017-06-30 DIAGNOSIS — I482 Chronic atrial fibrillation: Secondary | ICD-10-CM | POA: Diagnosis not present

## 2017-06-30 DIAGNOSIS — M25551 Pain in right hip: Secondary | ICD-10-CM | POA: Diagnosis not present

## 2017-07-02 ENCOUNTER — Encounter: Payer: Self-pay | Admitting: Physician Assistant

## 2017-07-02 DIAGNOSIS — I129 Hypertensive chronic kidney disease with stage 1 through stage 4 chronic kidney disease, or unspecified chronic kidney disease: Secondary | ICD-10-CM | POA: Diagnosis not present

## 2017-07-02 DIAGNOSIS — I482 Chronic atrial fibrillation: Secondary | ICD-10-CM | POA: Diagnosis not present

## 2017-07-02 DIAGNOSIS — E1122 Type 2 diabetes mellitus with diabetic chronic kidney disease: Secondary | ICD-10-CM | POA: Diagnosis not present

## 2017-07-02 DIAGNOSIS — M25551 Pain in right hip: Secondary | ICD-10-CM | POA: Diagnosis not present

## 2017-07-02 DIAGNOSIS — M1990 Unspecified osteoarthritis, unspecified site: Secondary | ICD-10-CM | POA: Diagnosis not present

## 2017-07-02 DIAGNOSIS — N189 Chronic kidney disease, unspecified: Secondary | ICD-10-CM | POA: Diagnosis not present

## 2017-07-08 NOTE — Progress Notes (Deleted)
Cardiology Office Note Date:  07/08/2017  Patient ID:  Joseph Watts, DOB [birth year], MRN [id number] PCP:  Alysia Penna, MD  Cardiologist:  Dr. Anne Fu Electrophysiologist: Dr. Johney Frame  ***refresh   Chief Complaint: annual EP/device visit  History of Present Illness: Joseph Watts is a 84 y.o. male with history of VHD w/TAVR (x2), NICM, CHB, w/CRT-P, permanent AFib, pancytopenia (follows at [place]), CKD (III), chronic CHF, falls, HTN, HLD.  He comes in today to be seen for Dr. Johney Frame, last seen for EP by A. Glory Buff, NP in March 2018.  He saw cardiology service last in Oct., was found to be pale, his INR checked and reported his last Hgb 8 by hematology, falls with scrapes, no reported serious injury though did apparently developed a cellulitis with one of his skin lacerations after a fall.  05/01/17 ER visit with fall, described as trip with new/unfamiliar shoes, hip pain, d/c to home  06/10/17 ER visit with fall, became dizzy when bending forward, no reported syncope.  Reported a likely dx of AML had been established though patient was declining any aggressive tx, getting home health visits, family mentioned he had 4 falls in the last month, patient declining any consideration of NH placement, living with his disabled wife.  D/c to home  *** Falls!!!  A/c????? *** bleeding/warfarin, continue?? *** who manages *** syncope?   Device History: MDT CRTP implanted 2012 for complete heart block, CHF   Past Medical History:  Diagnosis Date  . Aortic valve replaced    s/p TAVR  . BPH (benign prostatic hyperplasia)   . Cardiomyopathy (HCC)   . Complete heart block (HCC)   . Diabetes mellitus   . Hypercholesteremia   . Hyperlipemia   . Hypertension   . Iron deficiency anemia   . Permanent atrial fibrillation [facility])     Past Surgical History:  Procedure Laterality Date  . PACEMAKER INSERTION  05/12/10   MDT Consult CRT-P implanted at [facility] by Dr Chryl Heck  . TRANSCATHETER AORTIC VALVE  REPLACEMENT, TRANSAORTIC  2012   [facility]    Current Outpatient Medications  Medication Sig Dispense Refill  . carvedilol (COREG) 25 MG tablet Take 25 mg by mouth 2 (two) times daily with a meal.    . dorzolamide-timolol (COSOPT) 22.3-6.8 MG/ML ophthalmic solution Place 1 drop into both eyes 2 (two) times daily.    Marland Kitchen epoetin alfa (EPOGEN,PROCRIT) 40981 UNIT/ML injection Inject 20,000 Units into the vein every 14 (fourteen) days.    . ferrous sulfate 325 (65 FE) MG tablet Take 162.5 mg by mouth daily with breakfast.     . finasteride (PROSCAR) 5 MG tablet Take 5 mg by mouth daily.      . fluocinonide (LIDEX) 0.05 % ointment Apply 1 application topically daily as needed (skin care, irritation, dryness).     . fluticasone (FLONASE) 50 MCG/ACT nasal spray Place 2 sprays into the nose daily as needed for allergies.     . furosemide (LASIX) 20 MG tablet Take 1 tablet (20 mg total) by mouth daily as needed for fluid.    Marland Kitchen gabapentin (NEURONTIN) 100 MG capsule Take 100 mg by mouth See admin instructions. At bedtime on Sunday, Tuesday, Thursday and Saturday    . latanoprost (XALATAN) 0.005 % ophthalmic solution Place 1 drop into both eyes at bedtime.      Marland Kitchen loratadine (CLARITIN) 10 MG tablet Take 10 mg by mouth daily.    . mirtazapine (REMERON) 15 MG tablet Take 15 mg by mouth at bedtime.    Marland Kitchen  Multiple Vitamins-Minerals (PRESERVISION AREDS PO) Take 1 tablet by mouth daily.    . polyethylene glycol powder (GLYCOLAX/MIRALAX) powder Take 17 g by mouth daily as needed for mild constipation.    . Tamsulosin HCl (FLOMAX) 0.4 MG CAPS Take 0.4 mg by mouth at bedtime.     . triamcinolone cream (KENALOG) 0.1 % Apply 1 application topically daily as needed (itching).     Marland Kitchen VITAMIN K PO Take 100 mcg by mouth daily.     Marland Kitchen warfarin (COUMADIN) 2 MG tablet Take 2-3 mg by mouth See admin instructions. Take 1 tablet on Wednesday then take 1 and 1/2 tablets all other days     No current facility-administered medications  for this visit.     Allergies:   Motrin [ibuprofen]   Social History:  The patient  reports that  has never smoked. he has never used smokeless tobacco. He reports that he does not drink alcohol or use drugs.   Family History:  The patient's family history includes Diabetes in his father; Hypertension in his unknown relative; Other in his mother; Prostate cancer in his father; Skin cancer in his brother.  ROS:  Please see the history of present illness.  All other systems are reviewed and otherwise negative.   PHYSICAL EXAM: *** VS:  There were no vitals taken for this visit. BMI: There is no height or weight on file to calculate BMI. Well nourished, well developed, in no acute distress  HEENT: normocephalic, atraumatic  Neck: no JVD, carotid bruits or masses Cardiac:  *** RRR; no significant murmurs, no rubs, or gallops Lungs:  *** CTA b/l, no wheezing, rhonchi or rales  Abd: soft, nontender MS: no deformity or *** atrophy Ext: *** no edema  Skin: warm and dry, no rash Neuro:  No gross deficits appreciated Psych: euthymic mood, full affect  *** PPM site is stable, no tethering or discomfort   EKG:  Not done today Device interrogation done today and reviewed by myself: ***  05/09/14: TTE LVEF 51% Mild perivalvular regurgitation Trivial MR, PR, TR   Recent Labs: 12/27/2016: ALT 29 06/10/2017: BUN 17; Creatinine, Ser 1.16; Hemoglobin 9.0; Platelets 111; Potassium 4.9; Sodium 136  No results found for requested labs within last 8760 hours.   CrCl cannot be calculated (Patient's most recent lab result is older than the maximum 21 days allowed.).   Wt Readings from Last 3 Encounters:  06/10/17 145 lb (65.8 kg)  05/01/17 160 lb (72.6 kg)  02/24/17 158 lb 6.4 oz (71.8 kg)     Other studies reviewed: Additional studies/records reviewed today include: summarized above  ASSESSMENT AND PLAN:  1. CRT-P     ***  2. NICM, improved LVEF by last echo     ***  3. VHD, s/p  TAVR (x2)     ***  3. Permanent AFib    CHA2DS2Vasc is 3, on Warfarin    ***     Disposition: F/u with ***  Current medicines are reviewed at length with the patient today.  The patient did not have any concerns regarding medicines.***  Signed, Francis Dowse, PA-C 07/08/2017 5:44 PM     [facility] [address] [place] [address] [phone] (office)  [phone] (fax)

## 2017-07-10 ENCOUNTER — Encounter: Payer: Medicare HMO | Admitting: Physician Assistant

## 2017-07-15 DIAGNOSIS — I10 Essential (primary) hypertension: Secondary | ICD-10-CM | POA: Diagnosis not present

## 2017-07-15 DIAGNOSIS — E7849 Other hyperlipidemia: Secondary | ICD-10-CM | POA: Diagnosis not present

## 2017-07-15 DIAGNOSIS — R634 Abnormal weight loss: Secondary | ICD-10-CM | POA: Diagnosis not present

## 2017-07-15 DIAGNOSIS — E119 Type 2 diabetes mellitus without complications: Secondary | ICD-10-CM | POA: Diagnosis not present

## 2017-07-15 DIAGNOSIS — N183 Chronic kidney disease, stage 3 (moderate): Secondary | ICD-10-CM | POA: Diagnosis not present

## 2017-07-15 DIAGNOSIS — I4891 Unspecified atrial fibrillation: Secondary | ICD-10-CM | POA: Diagnosis not present

## 2017-07-15 DIAGNOSIS — Z7901 Long term (current) use of anticoagulants: Secondary | ICD-10-CM | POA: Diagnosis not present

## 2017-07-15 DIAGNOSIS — R17 Unspecified jaundice: Secondary | ICD-10-CM | POA: Diagnosis not present

## 2017-07-24 ENCOUNTER — Encounter: Payer: Self-pay | Admitting: Nurse Practitioner

## 2017-07-24 ENCOUNTER — Telehealth: Payer: Self-pay

## 2017-07-24 NOTE — Telephone Encounter (Signed)
Returned daughters call as requested by voice mail.  She sent a remote transmission because patient had gained 5 lbs overnight.  Reviewed remote transmission sent today to determine if patient has fluid accumulation.  Advised he has a couple of days fluid in the last week but not since 07/19/2017.  Advised in the last 2 days the transmission suggests dryness but he is back at baseline.  Advised to follow directions given in my chart from Gypsy Balsam, NP to take extra Furosemide if needed for weight gain. They will check weight tomorrow and if weight still up with give him extra Furosemide.  Confirmed she has my direct number.

## 2017-08-19 ENCOUNTER — Ambulatory Visit (INDEPENDENT_AMBULATORY_CARE_PROVIDER_SITE_OTHER): Payer: Medicare HMO | Admitting: *Deleted

## 2017-08-19 DIAGNOSIS — I5022 Chronic systolic (congestive) heart failure: Secondary | ICD-10-CM | POA: Diagnosis not present

## 2017-08-19 DIAGNOSIS — Z950 Presence of cardiac pacemaker: Secondary | ICD-10-CM | POA: Diagnosis not present

## 2017-08-19 DIAGNOSIS — I442 Atrioventricular block, complete: Secondary | ICD-10-CM

## 2017-08-19 NOTE — Progress Notes (Signed)
EPIC Encounter for ICM Monitoring  Patient Name: Joseph Watts is a 84 y.o. male Date: 08/19/2017 Primary Care Physican: Velna Hatchet, MD Primary Cardiologist: [facility] Electrophysiologist: Allred Dry Weight:134 lbs Bi-V Pacing: 99.9%        Spoke with daughter, [name]. Heart Failure questions reviewed, pt asymptomatic.   Thoracic impedance normal.  Prescribed dosage: Furosemide 20 mg 1 tablet daily as needed for swelling.   Labs: 02/24/2017 Creatinine 1.30, BUN 33, Potassium 4.8, Sodium 136, EGFR 49-56 12/30/2016 Creatinine 1.34, BUN 33, Potassium 3.8, Sodium 137, EGFR 46-53 12/29/2016 Creatinine 1.44, BUN 38, Potassium 4.3, Sodium 137, EGFR 42-48  12/28/2016 Creatinine 1.27, BUN 32, Potassium 4.8, Sodium 137, EGFR 49-56  12/27/2016 Creatinine 1.28, BUN 28, Potassium 5.1, Sodium 135, EGFR 48-56  Recommendations: No changes.   Encouraged to call for fluid symptoms.  Follow-up plan: ICM clinic phone appointment on 11/03/2017 (daughter out of town 1st week in July) since he has an office appointment scheduled 09/25/2017 with Chanetta Marshall, NP.  Copy of ICM check sent to Dr. Rayann Heman.   3 month ICM trend: 08/19/2017    1 Year ICM trend:       Rosalene Billings, RN 08/19/2017 5:04 PM

## 2017-08-20 ENCOUNTER — Encounter: Payer: Self-pay | Admitting: Cardiology

## 2017-08-20 NOTE — Progress Notes (Signed)
Remote pacemaker transmission.   

## 2017-08-22 LAB — CUP PACEART REMOTE DEVICE CHECK
Brady Statistic AP VP Percent: 0 %
Brady Statistic AP VS Percent: 0 %
Brady Statistic AS VP Percent: 0 %
Date Time Interrogation Session: 20190423164848
Implantable Lead Implant Date: 20120113
Implantable Lead Implant Date: 20120113
Implantable Lead Location: 753858
Implantable Lead Location: 753859
Lead Channel Impedance Value: 266 Ohm
Lead Channel Impedance Value: 342 Ohm
Lead Channel Impedance Value: 4047 Ohm
Lead Channel Impedance Value: 4047 Ohm
Lead Channel Impedance Value: 418 Ohm
Lead Channel Impedance Value: 494 Ohm
Lead Channel Impedance Value: 608 Ohm
Lead Channel Pacing Threshold Amplitude: 1 V
Lead Channel Pacing Threshold Pulse Width: 0.4 ms
Lead Channel Pacing Threshold Pulse Width: 0.4 ms
Lead Channel Sensing Intrinsic Amplitude: 3.5 mV
Lead Channel Setting Pacing Amplitude: 1.5 V
Lead Channel Setting Pacing Amplitude: 2 V
Lead Channel Setting Pacing Pulse Width: 0.4 ms
Lead Channel Setting Pacing Pulse Width: 0.4 ms
MDC IDC LEAD IMPLANT DT: 20120113
MDC IDC LEAD LOCATION: 753860
MDC IDC MSMT BATTERY REMAINING LONGEVITY: 30 mo
MDC IDC MSMT BATTERY VOLTAGE: 2.92 V
MDC IDC MSMT LEADCHNL LV IMPEDANCE VALUE: 4047 Ohm
MDC IDC MSMT LEADCHNL RA IMPEDANCE VALUE: 399 Ohm
MDC IDC MSMT LEADCHNL RA PACING THRESHOLD AMPLITUDE: 0.875 V
MDC IDC MSMT LEADCHNL RA SENSING INTR AMPL: 0.5 mV
MDC IDC MSMT LEADCHNL RA SENSING INTR AMPL: 1.125 mV
MDC IDC MSMT LEADCHNL RV PACING THRESHOLD AMPLITUDE: 0.625 V
MDC IDC MSMT LEADCHNL RV PACING THRESHOLD PULSEWIDTH: 0.4 ms
MDC IDC MSMT LEADCHNL RV SENSING INTR AMPL: 3.5 mV
MDC IDC PG IMPLANT DT: 20111019
MDC IDC SET LEADCHNL RV SENSING SENSITIVITY: 0.9 mV
MDC IDC STAT BRADY AS VS PERCENT: 0 %
MDC IDC STAT BRADY RA PERCENT PACED: 0 %
MDC IDC STAT BRADY RV PERCENT PACED: 99.9 %

## 2017-09-02 ENCOUNTER — Encounter: Payer: Self-pay | Admitting: Cardiology

## 2017-09-16 ENCOUNTER — Ambulatory Visit: Payer: Medicare HMO | Admitting: Cardiology

## 2017-09-16 ENCOUNTER — Encounter: Payer: Self-pay | Admitting: Cardiology

## 2017-09-16 VITALS — BP 110/54 | HR 76 | Ht 72.0 in | Wt 133.6 lb

## 2017-09-16 DIAGNOSIS — Z952 Presence of prosthetic heart valve: Secondary | ICD-10-CM

## 2017-09-16 DIAGNOSIS — Z951 Presence of aortocoronary bypass graft: Secondary | ICD-10-CM

## 2017-09-16 DIAGNOSIS — Z950 Presence of cardiac pacemaker: Secondary | ICD-10-CM

## 2017-09-16 DIAGNOSIS — I5022 Chronic systolic (congestive) heart failure: Secondary | ICD-10-CM | POA: Diagnosis not present

## 2017-09-16 NOTE — Progress Notes (Signed)
[address] [place], [place]  [address] Phone: [phone] Fax:  [phone]  Date:  09/16/2017   ID:  Joseph Watts, DOB 28-Mar-1927, MRN [id number]  PCP:  Alysia Penna, MD   History of Present Illness: Joseph Watts is a 84 y.o. male status post TAVR X 2 at [facility]. Ejection fraction 35%. ICD-Bi-V.  After second valve procedure his central aortic regurgitation jet has resolved. He still has some regurgitation perivalvular. Notes reviewed. Dr. Romeo Apple.   Pancytopenia followed by Dr. Shirline Frees.  Left lower extremity edema resolved. He is off of Lasix, discontinued by [facility]. This was likely secondary to chronic kidney disease. I encouraged use of compression hose.  At one point I was concerned about hypotension however his carvedilol is now back up to 6.25. He is doing well. His [facility] physician increased this.  01/01/13 - mild SOB increased. Weight stable. No CP. Good UOP tried a few days of Lasix. Helped.  04/15/13-currently doing very well. Walking to [employer] without much difficulty. Still short of breath however. No changes made.  06/30/14 -new dx of PAF on coumadin. Longest episode was 4 days. Mild SOB at times with ambulation however this is intermittent. [facility] following [facility]. INR 2.1 last check. No signs of bleeding. Chronic pancytopenia. Asked him to check with [facility] whether or not he needs to continue aspirin 81 mg. Weight loss. Discussed continuing with protein.  05/05/15-prior visit with Nada Boozer, NP, cardiac cachexia was hypothesized however most recent echocardiogram at [facility] in 03/2015 demonstrated an ejection fraction of 45%, reassuring with only mild perivalvular regurgitation of aortic TAVR x 2 valve.  Afib since 8/16. They're requesting pacemaker follow-up here.  08/08/16-since we last slight sure there, he fractured his clavicle. He's had some more weight loss. Shortness of breath with activity noted. Balance  issues.  09/16/2017-seems to be deteriorating.  Hunched over in chair.  Cachectic, more disheveled, series of falls.  Most of care at [facility] at this point.  Still tolerating full dose carvedilol.  Has been diagnosed with myelodysplastic syndrome.  He has not had a bone marrow.  Treating empirically.  No chest pain.  No shortness of breath at rest.    Wt Readings from Last 3 Encounters:  09/16/17 133 lb 9.6 oz (60.6 kg)  06/10/17 145 lb (65.8 kg)  05/01/17 160 lb (72.6 kg)     Past Medical History:  Diagnosis Date  . Aortic valve replaced    s/p TAVR  . BPH (benign prostatic hyperplasia)   . Cardiomyopathy (HCC)   . Complete heart block (HCC)   . Diabetes mellitus   . Hypercholesteremia   . Hyperlipemia   . Hypertension   . Iron deficiency anemia   . Permanent atrial fibrillation [facility])     Past Surgical History:  Procedure Laterality Date  . PACEMAKER INSERTION  05/12/10   MDT Consult CRT-P implanted at [facility] by Dr Chryl Heck  . TRANSCATHETER AORTIC VALVE REPLACEMENT, TRANSAORTIC  2012   [facility]    Current Outpatient Medications  Medication Sig Dispense Refill  . carvedilol (COREG) 25 MG tablet Take 25 mg by mouth 2 (two) times daily with a meal.    . cetirizine (ZYRTEC) 10 MG tablet Take 10 mg by mouth daily.    . Darbepoetin Alfa (ARANESP) 200 MCG/0.4ML SOSY injection Inject 200 mcg into the skin every three (3) weeks.    . dorzolamide-timolol (COSOPT) 22.3-6.8 MG/ML ophthalmic solution Place 1 drop into both eyes 2 (  two) times daily.    . famotidine (PEPCID) 20 MG tablet Take 20 mg by mouth daily.    . ferrous sulfate 325 (65 FE) MG tablet Take 162.5 mg by mouth daily with breakfast.     . finasteride (PROSCAR) 5 MG tablet Take 5 mg by mouth daily.      . fluocinonide (LIDEX) 0.05 % ointment Apply 1 application topically daily as needed (skin care, irritation, dryness).     . fluticasone (FLONASE) 50 MCG/ACT nasal spray Place 2 sprays into the nose daily as needed for allergies.      . furosemide (LASIX) 20 MG tablet Take 1 tablet (20 mg total) by mouth daily as needed for fluid.    Marland Kitchen gabapentin (NEURONTIN) 100 MG capsule Take 100 mg by mouth See admin instructions. At bedtime on Sunday, Tuesday, Thursday and Saturday    . Latanoprostene Bunod (VYZULTA OP) Place 1 drop into both eyes at bedtime.    Marland Kitchen loratadine (CLARITIN) 10 MG tablet Take 10 mg by mouth daily.    . mirtazapine (REMERON) 30 MG tablet Take 30 mg by mouth at bedtime.    . Multiple Vitamins-Minerals (PRESERVISION AREDS 2 PO) Take 1 capsule by mouth daily. (PRESERVISION AREDS 2)    . polyethylene glycol powder (GLYCOLAX/MIRALAX) powder Take 17 g by mouth daily as needed for mild constipation.    . Tamsulosin HCl (FLOMAX) 0.4 MG CAPS Take 0.4 mg by mouth at bedtime.     . triamcinolone cream (KENALOG) 0.1 % Apply 1 application topically daily as needed (itching).     . vitamin B-12 (CYANOCOBALAMIN) 500 MCG tablet Take 500 mcg by mouth daily.    Marland Kitchen VITAMIN K PO Take 100 mcg by mouth daily.     Marland Kitchen warfarin (COUMADIN) 2 MG tablet Take one and a half (1.5) tablets (3 mg) by mouth daily.     No current facility-administered medications for this visit.     Allergies:    Allergies  Allergen Reactions  . Motrin [Ibuprofen] Other (See Comments)    Instructed by MD not to take due to Warfarin     Social History:  The patient  reports that he has never smoked. He has never used smokeless tobacco. He reports that he does not drink alcohol or use drugs.   ROS:  Please see the history of present illness.   Unless specified above all other review of systems negative PHYSICAL EXAM: VS:  BP (!) 110/54   Pulse 76   Ht 6' (1.829 m)   Wt 133 lb 9.6 oz (60.6 kg)   BMI 18.12 kg/m  GEN: Cachectic, elderly, hunched over in wheelchair, kyphosis in no acute distress  HEENT: normal, discharge noted from right eye Neck: no JVD, carotid bruits, or masses Cardiac: RRR; soft diastolic murmur. No rubs, or gallops,no edema   Respiratory:  clear to auscultation bilaterally, normal work of breathing GI: soft, nontender, nondistended, + BS MS: Muscle atrophy noted, hunched over. Skin: warm and dry, no rash Neuro:  Alert and Oriented x 3, Strength and sensation are intact Psych: euthymic mood, full affect    EKG:  06/30/14-AV pacing, 70  Echocardiogram  12/16 at [facility]-EF 45%, mild aortic regurgitation, mild LVH                                                                                                                                                                                                                                                                                                                                                                                                                                                   ASSESSMENT AND PLAN:  1. Chronic systolic heart failure-ejection fraction has improved with biventricular pacing.  He does not have a defibrillator function.  Doing well without any significant changes. No changes in shortness of breath. NYHA class II symptoms.  Mainly his symptoms come from debilitation, weakness, cachexia.  Very challenging for him to get to appointments.  EF currently approximately 45-50 %.   We will continue with carvedilol, currently on healthy dose of 25 mg twice a day.  S he continues to lose weight, this may need to be decreased. No longer utilizing furosemide on a daily basis only as needed. Chronic kidney disease noted. No ACE inhibitor because of chronic kidney disease and hypertension. 2. Cachexia- has myelodysplastic syndrome.  Does not wish to undergo biopsy of bone marrow, understanding.  Continue to encourage protein. He is getting Glucerna from the [place].  Unfortunately he did suffer many falls and  fractured his clavicle which  healed with sling. 3. Chronic kidney disease stage III-last creatinine 1.2.  4. Biventricular pacing- Dr. Johney Frame and device clinic. Continue to follow-up with repeat here. 5. Aortic insufficiency-mild post TAVR, can hear on exam. No changes on exam today. 6. Atrial fibrillation permanent-since August 2016, histogram demonstrates permanent atrial fibrillation. He seems relatively unchanged from a symptom standpoint. He is not demonstrating any significant shortness of breath.  Fairly stable 7. We will see back in 12 months.  She may contact me if necessary.  His debility makes it very challenging for him to go to appointments.  He is getting most of his care at this point from the [place].  Signed, Donato Schultz, MD [facility]  09/16/2017 9:13 AM

## 2017-09-16 NOTE — Patient Instructions (Signed)

## 2017-09-24 NOTE — Progress Notes (Signed)
Electrophysiology Office Note Date: 09/25/2017  ID:  Joseph Watts, DOB [birth year], MRN [id number]  PCP: Alysia Penna, MD Primary Cardiologist: Anne Fu Electrophysiologist: Allred  CC: Pacemaker follow-up  Joseph Watts is a 84 y.o. male seen today for Dr Johney Frame.  He presents today for routine electrophysiology followup.  Since last being seen in our clinic, the patient has become progressively debilitated.  He is seen with his daughter today.  He has had several falls.  He is extremely fatigued and is unable to keep his head up during the office visit today.   He denies chest pain, palpitations, dyspnea, PND, orthopnea, nausea, vomiting, dizziness, syncope, edema, weight gain, or early satiety.  His appetite is not good but he does like vanilla ensure.  His daughter is not sure about the consistency of his medications. He continues to follow closely with the [place].   Device History: MDT CRTP implanted 2012 for complete heart block, CHF   Past Medical History:  Diagnosis Date  . Aortic valve replaced    s/p TAVR  . BPH (benign prostatic hyperplasia)   . Cardiomyopathy (HCC)   . Complete heart block (HCC)   . Diabetes mellitus   . Hypercholesteremia   . Hyperlipemia   . Hypertension   . Iron deficiency anemia   . Permanent atrial fibrillation [facility])    Past Surgical History:  Procedure Laterality Date  . PACEMAKER INSERTION  05/12/10   MDT Consult CRT-P implanted at [facility] by Dr Chryl Heck  . TRANSCATHETER AORTIC VALVE REPLACEMENT, TRANSAORTIC  2012   [facility]    Current Outpatient Medications  Medication Sig Dispense Refill  . carvedilol (COREG) 25 MG tablet Take 25 mg by mouth 2 (two) times daily with a meal.    . cetirizine (ZYRTEC) 10 MG tablet Take 10 mg by mouth daily.    . Darbepoetin Alfa (ARANESP) 200 MCG/0.4ML SOSY injection Inject 200 mcg into the skin every three (3) weeks.    . dorzolamide-timolol (COSOPT) 22.3-6.8 MG/ML ophthalmic solution Place 1 drop into both eyes  2 (two) times daily.    . famotidine (PEPCID) 20 MG tablet Take 20 mg by mouth daily.    . ferrous sulfate 325 (65 FE) MG tablet Take 162.5 mg by mouth daily with breakfast.     . finasteride (PROSCAR) 5 MG tablet Take 5 mg by mouth daily.      . fluocinonide (LIDEX) 0.05 % ointment Apply 1 application topically daily as needed (skin care, irritation, dryness).     . fluticasone (FLONASE) 50 MCG/ACT nasal spray Place 2 sprays into the nose daily as needed for allergies.     . furosemide (LASIX) 20 MG tablet Take 1 tablet (20 mg total) by mouth daily as needed for fluid.    Marland Kitchen gabapentin (NEURONTIN) 100 MG capsule Take 100 mg by mouth See admin instructions. At bedtime on Sunday, Tuesday, Thursday and Saturday    . Latanoprostene Bunod (VYZULTA OP) Place 1 drop into both eyes at bedtime.    Marland Kitchen loratadine (CLARITIN) 10 MG tablet Take 10 mg by mouth daily.    . mirtazapine (REMERON) 30 MG tablet Take 30 mg by mouth at bedtime.    . Multiple Vitamins-Minerals (PRESERVISION AREDS 2 PO) Take 1 capsule by mouth daily. (PRESERVISION AREDS 2)    . polyethylene glycol powder (GLYCOLAX/MIRALAX) powder Take 17 g by mouth daily as needed for mild constipation.    . Tamsulosin HCl (FLOMAX) 0.4 MG CAPS Take 0.4 mg by mouth at bedtime.     Marland Kitchen  triamcinolone cream (KENALOG) 0.1 % Apply 1 application topically daily as needed (itching).     . vitamin B-12 (CYANOCOBALAMIN) 500 MCG tablet Take 500 mcg by mouth daily.    Marland Kitchen VITAMIN K PO Take 100 mcg by mouth daily.     Marland Kitchen warfarin (COUMADIN) 2 MG tablet Take one and a half (1.5) tablets (3 mg) by mouth daily.     No current facility-administered medications for this visit.     Allergies:   Motrin [ibuprofen]   Social History: Social History   Socioeconomic History  . Marital status: Married    Spouse name: Not on file  . Number of children: Not on file  . Years of education: Not on file  . Highest education level: Not on file  Occupational History  . Not on  file  Social Needs  . Financial resource strain: Not on file  . Food insecurity:    Worry: Not on file    Inability: Not on file  . Transportation needs:    Medical: Not on file    Non-medical: Not on file  Tobacco Use  . Smoking status: Never Smoker  . Smokeless tobacco: Never Used  Substance and Sexual Activity  . Alcohol use: No  . Drug use: No  . Sexual activity: Not on file  Lifestyle  . Physical activity:    Days per week: Not on file    Minutes per session: Not on file  . Stress: Not on file  Relationships  . Social connections:    Talks on phone: Not on file    Gets together: Not on file    Attends religious service: Not on file    Active member of club or organization: Not on file    Attends meetings of clubs or organizations: Not on file    Relationship status: Not on file  . Intimate partner violence:    Fear of current or ex partner: Not on file    Emotionally abused: Not on file    Physically abused: Not on file    Forced sexual activity: Not on file  Other Topics Concern  . Not on file  Social History Narrative   Retired from [employer]    Family History: Family History  Problem Relation Age of Onset  . Other Mother        health hx unknown  . Diabetes Father   . Prostate cancer Father   . Hypertension Unknown   . Skin cancer Brother      Review of Systems: All other systems reviewed and are otherwise negative except as noted above.   Physical Exam: VS:  BP (!) 116/50   Pulse 81   Ht 6' (1.829 m)   Wt 132 lb 3.2 oz (60 kg) Comment: Pt's daughter stated weight  SpO2 92%   BMI 17.93 kg/m  , BMI Body mass index is 17.93 kg/m.  GEN- The patient is elderly, frail, cachectic appearing, alert and oriented x 3 today.   HEENT: normocephalic, atraumatic; sclera clear, conjunctiva pink; hearing intact; oropharynx clear; neck supple  Lungs- Clear to ausculation bilaterally, normal work of breathing.  No wheezes, rales, rhonchi Heart-  Regular rate and rhythm (paced) GI- soft, non-tender, non-distended, bowel sounds present  Extremities- no clubbing, cyanosis, or edema  MS- no significant deformity or atrophy Skin- warm and dry, no rash or lesion; PPM pocket well healed Psych- euthymic mood, full affect Neuro- strength and sensation are intact  PPM Interrogation- reviewed in  detail today,  See PACEART report  EKG:  EKG is not ordered today.  Recent Labs: 12/27/2016: ALT 29 06/10/2017: BUN 17; Creatinine, Ser 1.16; Hemoglobin 9.0; Platelets 111; Potassium 4.9; Sodium 136   Wt Readings from Last 3 Encounters:  09/25/17 132 lb 3.2 oz (60 kg)  09/16/17 133 lb 9.6 oz (60.6 kg)  06/10/17 145 lb (65.8 kg)     Other studies Reviewed: Additional studies/ records that were reviewed today include: Dr Judd Gaudier office notes  Assessment and Plan:  1.  Complete heart block Normal PPM function See Pace Art report No changes today  2.  Chronic systolic heart failure Euvolemic on exam Continue follow up in ICM clinic   3.  HTN Stable No change required today  4.  Permanent AF V rates controlled Continue Warfarin for CHADS2VASC of 4 INR followed through [place]  It is increasingly difficult for him to come to the office for appointments.  They have to rely on public transportation. I have discussed with his daughter following his pacemaker through remotes only.  They follow closely with Randon Goldsmith in the [facility] clinic.  She is agreeable.    Current medicines are reviewed at length with the patient today.   The patient does not have concerns regarding his medicines.  The following changes were made today:  none  Labs/ tests ordered today include: none No orders of the defined types were placed in this encounter.    Disposition:   Follow up with [facility], ICM clinic, Dr Anne Fu as scheduled, EP in office prn    Signed, Gypsy Balsam, NP 09/25/2017 10:53 AM  [facility] [address] [place] [address] [phone] (office) [phone] (fax)

## 2017-09-25 ENCOUNTER — Encounter: Payer: Self-pay | Admitting: Nurse Practitioner

## 2017-09-25 ENCOUNTER — Ambulatory Visit: Payer: Medicare HMO | Admitting: Nurse Practitioner

## 2017-09-25 VITALS — BP 116/50 | HR 81 | Ht 72.0 in | Wt 132.2 lb

## 2017-09-25 DIAGNOSIS — I442 Atrioventricular block, complete: Secondary | ICD-10-CM | POA: Diagnosis not present

## 2017-09-25 DIAGNOSIS — I10 Essential (primary) hypertension: Secondary | ICD-10-CM

## 2017-09-25 DIAGNOSIS — I5022 Chronic systolic (congestive) heart failure: Secondary | ICD-10-CM | POA: Diagnosis not present

## 2017-09-25 DIAGNOSIS — I482 Chronic atrial fibrillation: Secondary | ICD-10-CM

## 2017-09-25 DIAGNOSIS — I4821 Permanent atrial fibrillation: Secondary | ICD-10-CM

## 2017-09-25 LAB — CUP PACEART INCLINIC DEVICE CHECK
Implantable Lead Implant Date: 20120113
Implantable Lead Location: 753859
Implantable Lead Location: 753860
Implantable Lead Model: 5076
MDC IDC LEAD IMPLANT DT: 20120113
MDC IDC LEAD IMPLANT DT: 20120113
MDC IDC LEAD LOCATION: 753858
MDC IDC PG IMPLANT DT: 20111019
MDC IDC SESS DTM: 20190530105820

## 2017-09-25 NOTE — Patient Instructions (Addendum)
Medication Instructions:   Your physician recommends that you continue on your current medications as directed. Please refer to the Current Medication list given to you today.   If you need a refill on your cardiac medications before your next appointment, please call your pharmacy.  Labwork: NONE ORDERED  TODAY    Testing/Procedures:  NONE ORDERED  TODAY    Follow-Up:Remote monitoring is used to monitor your Pacemaker of ICD from home. This monitoring reduces the number of office visits required to check your device to one time per year. It allows [place] to keep an eye on the functioning of your device to ensure it is working properly. You are scheduled for a device check from home on 11-18-17 .You may send your transmission at any time that day. If you have a wireless device, the transmission will be sent automatically. After your physician reviews your transmission, you will receive a postcard with your next transmission date.      Any Other Special Instructions Will Be Listed Below (If Applicable).

## 2017-11-04 ENCOUNTER — Telehealth: Payer: Self-pay | Admitting: Cardiology

## 2017-11-04 ENCOUNTER — Encounter: Payer: Self-pay | Admitting: Nurse Practitioner

## 2017-11-04 NOTE — Telephone Encounter (Signed)
Attempted to confirm remote transmission with pt. No answer and was unable to leave a message.   

## 2017-11-11 ENCOUNTER — Telehealth: Payer: Self-pay

## 2017-11-11 NOTE — Telephone Encounter (Signed)
Call to daughter Marton Redwood, as requested by voice mail message, regarding leg swelling and sent a transmission for review.  She stated he is doing fine other than his legs are swelling.  Advised remote transmission suggests that it is normal and device does not show fluid accumulation.  Advised to have him take the PRN Furosemide 20 mg 1 tablet for 2 days to help with leg swelling.  She said [facility] and started him on Metoprolol Succinate 200 mg 1 tablet daily.  Patient was missing the evening dosage of Carvedilol and Metoprolol is once daily.  Next ICM remote transmission is scheduled for 11/18/2017

## 2017-11-18 ENCOUNTER — Telehealth: Payer: Self-pay | Admitting: Cardiology

## 2017-11-18 ENCOUNTER — Ambulatory Visit (INDEPENDENT_AMBULATORY_CARE_PROVIDER_SITE_OTHER): Payer: Medicare Other | Admitting: *Deleted

## 2017-11-18 DIAGNOSIS — I442 Atrioventricular block, complete: Secondary | ICD-10-CM | POA: Diagnosis not present

## 2017-11-18 DIAGNOSIS — Z950 Presence of cardiac pacemaker: Secondary | ICD-10-CM | POA: Diagnosis not present

## 2017-11-18 DIAGNOSIS — I5022 Chronic systolic (congestive) heart failure: Secondary | ICD-10-CM

## 2017-11-18 NOTE — Progress Notes (Signed)
EPIC Encounter for ICM Monitoring  Patient Name: Joseph Watts is a 84 y.o. male Date: 11/18/2017 Primary Care Physican: Velna Hatchet, MD Primary Cardiologist: [facility] Electrophysiologist: Allred Dry Weight:134lbs Bi-V Pacing: 99.8%        Spoke with daughter, Joseph Watts.  Heart Failure questions reviewed, pt asymptomatic.  Patient took Furosemide x 2 days last week for leg swelling and thinks that resolved the issue.  Palliative nurse is coming to the home and has not mentioned that legs are swollen now.    Thoracic impedance normal.  Prescribed dosage: Furosemide 20 mg 1 tablet daily as needed for swelling.   Labs: 02/24/2017 Creatinine 1.30, BUN 33, Potassium 4.8, Sodium 136, EGFR 49-56 12/30/2016 Creatinine 1.34, BUN 33, Potassium 3.8, Sodium 137, EGFR 46-53 12/29/2016 Creatinine 1.44, BUN 38, Potassium 4.3, Sodium 137, EGFR 42-48  12/28/2016 Creatinine 1.27, BUN 32, Potassium 4.8, Sodium 137, EGFR 49-56  12/27/2016 Creatinine 1.28, BUN 28, Potassium 5.1, Sodium 135, EGFR 48-56  Recommendations: No changes.    Follow-up plan: ICM clinic phone appointment on 12/30/2017 (prefers Tuesday transmissions).       Copy of ICM check sent to Dr. Rayann Heman.   3 month ICM trend: 11/18/2017    1 Year ICM trend:       Rosalene Billings, RN 11/18/2017 4:28 PM

## 2017-11-18 NOTE — Progress Notes (Signed)
Remote pacemaker transmission.   

## 2017-11-18 NOTE — Telephone Encounter (Signed)
Confirmed remote transmission w/ pt daughter.   

## 2017-11-24 ENCOUNTER — Emergency Department (HOSPITAL_COMMUNITY)
Admission: EM | Admit: 2017-11-24 | Discharge: 2017-11-24 | Disposition: A | Discharge status: Home / Self Care | Attending: Emergency Medicine | Admitting: Emergency Medicine

## 2017-11-24 ENCOUNTER — Emergency Department (HOSPITAL_COMMUNITY)

## 2017-11-24 DIAGNOSIS — E1122 Type 2 diabetes mellitus with diabetic chronic kidney disease: Secondary | ICD-10-CM | POA: Diagnosis not present

## 2017-11-24 DIAGNOSIS — N189 Chronic kidney disease, unspecified: Secondary | ICD-10-CM | POA: Diagnosis not present

## 2017-11-24 DIAGNOSIS — I130 Hypertensive heart and chronic kidney disease with heart failure and stage 1 through stage 4 chronic kidney disease, or unspecified chronic kidney disease: Secondary | ICD-10-CM | POA: Insufficient documentation

## 2017-11-24 DIAGNOSIS — Z79899 Other long term (current) drug therapy: Secondary | ICD-10-CM | POA: Insufficient documentation

## 2017-11-24 DIAGNOSIS — Z7901 Long term (current) use of anticoagulants: Secondary | ICD-10-CM | POA: Insufficient documentation

## 2017-11-24 DIAGNOSIS — I5022 Chronic systolic (congestive) heart failure: Secondary | ICD-10-CM | POA: Diagnosis not present

## 2017-11-24 DIAGNOSIS — R791 Abnormal coagulation profile: Secondary | ICD-10-CM | POA: Insufficient documentation

## 2017-11-24 DIAGNOSIS — R0600 Dyspnea, unspecified: Secondary | ICD-10-CM | POA: Insufficient documentation

## 2017-11-24 DIAGNOSIS — R1084 Generalized abdominal pain: Secondary | ICD-10-CM | POA: Diagnosis not present

## 2017-11-24 DIAGNOSIS — R079 Chest pain, unspecified: Secondary | ICD-10-CM | POA: Insufficient documentation

## 2017-11-24 DIAGNOSIS — R1010 Upper abdominal pain, unspecified: Secondary | ICD-10-CM | POA: Diagnosis not present

## 2017-11-24 DIAGNOSIS — Z743 Need for continuous supervision: Secondary | ICD-10-CM | POA: Diagnosis not present

## 2017-11-24 DIAGNOSIS — R0789 Other chest pain: Secondary | ICD-10-CM | POA: Diagnosis not present

## 2017-11-24 DIAGNOSIS — R279 Unspecified lack of coordination: Secondary | ICD-10-CM | POA: Diagnosis not present

## 2017-11-24 DIAGNOSIS — R001 Bradycardia, unspecified: Secondary | ICD-10-CM | POA: Diagnosis not present

## 2017-11-24 LAB — CBC
HCT: 34.7 % — ABNORMAL LOW (ref 39.0–52.0)
Hemoglobin: 10.7 g/dL — ABNORMAL LOW (ref 13.0–17.0)
MCH: 31.1 pg (ref 26.0–34.0)
MCHC: 30.8 g/dL (ref 30.0–36.0)
MCV: 100.9 fL — ABNORMAL HIGH (ref 78.0–100.0)
Platelets: 125 10*3/uL — ABNORMAL LOW (ref 150–400)
RBC: 3.44 MIL/uL — ABNORMAL LOW (ref 4.22–5.81)
RDW: 16.3 % — ABNORMAL HIGH (ref 11.5–15.5)
WBC: 2.6 10*3/uL — ABNORMAL LOW (ref 4.0–10.5)

## 2017-11-24 LAB — PROTIME-INR
INR: 8.13
Prothrombin Time: 67.3 seconds — ABNORMAL HIGH (ref 11.4–15.2)

## 2017-11-24 LAB — I-STAT TROPONIN, ED: Troponin i, poc: 0.02 ng/mL (ref 0.00–0.08)

## 2017-11-24 LAB — BRAIN NATRIURETIC PEPTIDE: B Natriuretic Peptide: 265.8 pg/mL — ABNORMAL HIGH (ref 0.0–100.0)

## 2017-11-24 LAB — TROPONIN I: Troponin I: <0.03 ng/mL (ref <0.03)

## 2017-11-24 NOTE — ED Provider Notes (Signed)
[facility] EMERGENCY DEPARTMENT Provider Note   CSN: [id number] Arrival date & time: 11/24/17  1504     History   Chief Complaint Chief Complaint  Patient presents with  . Chest Pain    HPI Joseph Watts is a 84 y.o. male.  HPI   84 year old male with chest pain now resolved.  Reports that symptoms started while at rest approximately 1-1/2 hours ago.  Describes heaviness in the center of his chest.  Lasted about an hour and resolved and has been symptom-free since then.  Associate with some mild dyspnea.  No cough.  No fevers or chills.  No nausea or diaphoresis.  No unusual leg pain or swelling.  Reports he has been more or less in his usual state of health over the past few days.  Past Medical History:  Diagnosis Date  . Aortic valve replaced    s/p TAVR  . BPH (benign prostatic hyperplasia)   . Cardiomyopathy (HCC)   . Complete heart block (HCC)   . Diabetes mellitus   . Hypercholesteremia   . Hyperlipemia   . Hypertension   . Iron deficiency anemia   . Permanent atrial fibrillation [facility])     Patient Active Problem List   Diagnosis Date Noted  . Cellulitis of right hand 12/28/2016  . Fall 12/28/2016  . Pancytopenia (HCC) 12/28/2016  . Cellulitis 12/27/2016  . Complete heart block (HCC) 07/13/2015  . Permanent atrial fibrillation (HCC) 07/13/2015  . Essential hypertension 07/13/2015  . Anemia of other chronic disease 12/01/2012  . Hypercholesteremia   . Aortic valve replaced   . Chronic systolic heart failure (HCC) 08/26/2010  . Aortic insufficiency 08/26/2010  . Diabetes mellitus 08/26/2010  . CKD (chronic kidney disease) 08/26/2010  . Glaucoma 08/26/2010  . Status post biventricular cardiac pacemaker procedure 08/26/2010  . Hx of CABG 08/26/2010    Past Surgical History:  Procedure Laterality Date  . PACEMAKER INSERTION  05/12/10   MDT Consult CRT-P implanted at [facility] by Dr Chryl Heck  . TRANSCATHETER AORTIC VALVE REPLACEMENT,  TRANSAORTIC  2012   [facility]        Home Medications    Prior to Admission medications   Medication Sig Start Date End Date Taking? Authorizing Provider  carvedilol (COREG) 25 MG tablet Take 25 mg by mouth 2 (two) times daily with a meal. 11/11/17 Daughter reported [facility] discontinued    [provider]  cetirizine (ZYRTEC) 10 MG tablet Take 10 mg by mouth daily.    [provider]  Darbepoetin Alfa (ARANESP) 200 MCG/0.4ML SOSY injection Inject 200 mcg into the skin every three (3) weeks.    [provider]  dorzolamide-timolol (COSOPT) 22.3-6.8 MG/ML ophthalmic solution Place 1 drop into both eyes 2 (two) times daily.    [provider]  famotidine (PEPCID) 20 MG tablet Take 20 mg by mouth daily.    [provider]  ferrous sulfate 325 (65 FE) MG tablet Take 162.5 mg by mouth daily with breakfast.  09/03/11   Si Gaul, MD  finasteride (PROSCAR) 5 MG tablet Take 5 mg by mouth daily.      [provider]  fluocinonide (LIDEX) 0.05 % ointment Apply 1 application topically daily as needed (skin care, irritation, dryness).     [provider]  fluticasone (FLONASE) 50 MCG/ACT nasal spray Place 2 sprays into the nose daily as needed for allergies.     [provider]  furosemide (LASIX) 20 MG tablet Take 1 tablet (20 mg  total) by mouth daily as needed for fluid. 12/31/16   Tyrone Nine, MD  gabapentin (NEURONTIN) 100 MG capsule Take 100 mg by mouth See admin instructions. At bedtime on Sunday, Tuesday, Thursday and Saturday    [provider]  Latanoprostene Bunod (VYZULTA OP) Place 1 drop into both eyes at bedtime.    [provider]  loratadine (CLARITIN) 10 MG tablet Take 10 mg by mouth daily.    [provider]  metoprolol (TOPROL-XL) 200 MG 24 hr tablet Take 200 mg by mouth daily. 11/11/17 Daughter reported medication addition.    [facility]  mirtazapine (REMERON) 30 MG tablet Take 30 mg by  mouth at bedtime.    [provider]  Multiple Vitamins-Minerals (PRESERVISION AREDS 2 PO) Take 1 capsule by mouth daily. (PRESERVISION AREDS 2)    [provider]  polyethylene glycol powder (GLYCOLAX/MIRALAX) powder Take 17 g by mouth daily as needed for mild constipation.    [provider]  Tamsulosin HCl (FLOMAX) 0.4 MG CAPS Take 0.4 mg by mouth at bedtime.     [provider]  triamcinolone cream (KENALOG) 0.1 % Apply 1 application topically daily as needed (itching).     [provider]  vitamin B-12 (CYANOCOBALAMIN) 500 MCG tablet Take 500 mcg by mouth daily.    [provider]  VITAMIN K PO Take 100 mcg by mouth daily.     [provider]  warfarin (COUMADIN) 2 MG tablet Take one and a half (1.5) tablets (3 mg) by mouth daily.    [provider]    Family History Family History  Problem Relation Age of Onset  . Other Mother        health hx unknown  . Diabetes Father   . Prostate cancer Father   . Hypertension Unknown   . Skin cancer Brother     Social History Social History   Tobacco Use  . Smoking status: Never Smoker  . Smokeless tobacco: Never Used  Substance Use Topics  . Alcohol use: No  . Drug use: No     Allergies   Motrin [ibuprofen]   Review of Systems Review of Systems  All systems reviewed and negative, other than as noted in HPI. Physical Exam Updated Vital Signs BP 138/62   SpO2 95%   Physical Exam  Constitutional: He appears well-developed and well-nourished. No distress.  Frail/cachectic appearing but not distressed.  HENT:  Head: Normocephalic and atraumatic.  Eyes: Conjunctivae are normal. Right eye exhibits no discharge. Left eye exhibits no discharge.  Neck: Neck supple.  Cardiovascular: Normal rate and regular rhythm. Exam reveals no gallop and no friction rub.  Murmur heard. Cardiac device R chest  Pulmonary/Chest: Effort normal and breath sounds normal. No  respiratory distress.  Abdominal: Soft. He exhibits no distension. There is no tenderness.  Musculoskeletal: He exhibits no edema or tenderness.  Mild symmetric LE edema  Neurological: He is alert.  Skin: Skin is warm and dry.  Psychiatric: He has a normal mood and affect. His behavior is normal. Thought content normal.  Nursing note and vitals reviewed.    ED Treatments / Results  Labs (all labs ordered are listed, but only abnormal results are displayed) Labs Reviewed  CBC - Abnormal; Notable for the following components:      Result Value   WBC 2.6 (*)    RBC 3.44 (*)    Hemoglobin 10.7 (*)    HCT 34.7 (*)    MCV  100.9 (*)    RDW 16.3 (*)    Platelets 125 (*)    All other components within normal limits  PROTIME-INR - Abnormal; Notable for the following components:   Prothrombin Time 67.3 (*)    INR 8.13 (*)    All other components within normal limits  BRAIN NATRIURETIC PEPTIDE - Abnormal; Notable for the following components:   B Natriuretic Peptide 265.8 (*)    All other components within normal limits  TROPONIN I  I-STAT TROPONIN, ED    EKG EKG Interpretation  Date/Time:  Monday November 24 2017 15:07:42 EDT Ventricular Rate:  80 PR Interval:    QRS Duration: 161 QT Interval:  473 QTC Calculation: 546 R Axis:   -95 Text Interpretation:  Paced rhythm Nonspecific IVCD with LAD Consider left ventricular hypertrophy Confirmed by Raeford Razor [phone]) on 11/24/2017 3:45:46 PM Also confirmed by Raeford Razor [phone]), editor Sheppard Evens ([id number])  on 11/25/2017 9:16:23 AM   Radiology No results found.   Dg Chest 2 View  Result Date: 11/24/2017 CLINICAL DATA:  Chest pain EXAM: CHEST - 2 VIEW COMPARISON:  12/27/2016 FINDINGS: Right chest wall AICD leads are in unchanged position. Remote median sternotomy. Lungs are hyperinflated. Mild cardiomegaly. There is an endovascular aortic stent. No pleural effusion or pneumothorax. No focal airspace consolidation or  pulmonary edema. IMPRESSION: Hyperexpanded lungs suggestive of COPD.  No acute airspace disease. Electronically Signed   By: Deatra Robinson M.D.   On: 11/24/2017 16:17    Procedures Procedures (including critical care time)  Medications Ordered in ED Medications - No data to display   Initial Impression / Assessment and Plan / ED Course  I have reviewed the triage vital signs and the nursing notes.  Pertinent labs & imaging results that were available during my care of the patient were reviewed by me and considered in my medical decision making (see chart for details).     Per CHEST guidelines for management of supratherapeutic INRs in patients without significant bleeding with a INR of 4.5-10 we will have him hold his Coumadin for 2 doses, increased frequency of monitoring and have prescribing provider to adjust as necessary. No reoccurrence of CP. Troponin normal x2.  EKG w/o overt ischemic changes. I doubt ACS. Highly unlikely PE with INR this high. I doubt dissection or other emergent process.   It has been determined that no acute conditions requiring further emergency intervention are present at this time. The patient and daughter have been advised of the diagnosis and plan. I reviewed any labs and imaging including any potential incidental findings. We have discussed signs and symptoms that warrant return to the ED and they are listed in the discharge instructions.    Final Clinical Impressions(s) / ED Diagnoses   Final diagnoses:  Chest pain, unspecified type  Subtherapeutic international normalized ratio (INR)    ED Discharge Orders    None       Raeford Razor, MD 11/28/17 1515

## 2017-11-24 NOTE — Discharge Instructions (Signed)
INR (Coumadin level) today was 8.13.  You need to hold your Coumadin for 2 days and then have your INR rechecked at that point.  You need to discuss medication adjustments with your PCP or whomever prescribed this to you.

## 2017-11-24 NOTE — ED Triage Notes (Signed)
Pt in from home via [facility], per report the pts family called EMS for pt c/o cp non non specified, pt symptom onset today @ 13:00, pt had tenderness on palpation on bil upper abd, pt reports SOB in route and was placed on 2 L [place] in route, pt denies n/v/d, A&O x4

## 2017-11-24 NOTE — ED Notes (Signed)
Critical lab value, read back and physician notified.  INR 8.13

## 2017-11-24 NOTE — ED Notes (Signed)
Patient Alert and oriented to baseline. Stable and ambulatory to baseline. Patient verbalized understanding of the discharge instructions.  Patient belongings were taken by the patient. Patient transported by [facility] home.

## 2017-12-02 ENCOUNTER — Telehealth: Payer: Self-pay

## 2017-12-02 DIAGNOSIS — I499 Cardiac arrhythmia, unspecified: Secondary | ICD-10-CM | POA: Diagnosis not present

## 2017-12-02 DIAGNOSIS — R4020 Unspecified coma: Secondary | ICD-10-CM | POA: Diagnosis not present

## 2017-12-02 DIAGNOSIS — R404 Transient alteration of awareness: Secondary | ICD-10-CM | POA: Diagnosis not present

## 2017-12-02 NOTE — Telephone Encounter (Signed)
Returned daughters call as requested by voice mail message.  She reported patient passed away this morning and expressed my sympathy.  Provided [facility] number to return monitor and advised there is no hurry to call for return kit.

## 2017-12-13 LAB — CUP PACEART REMOTE DEVICE CHECK
Brady Statistic AS VP Percent: 0 %
Brady Statistic RV Percent Paced: 99.8 %
Implantable Lead Implant Date: 20120113
Implantable Lead Location: 753859
Implantable Lead Location: 753860
Implantable Lead Model: 4196
Implantable Lead Model: 5076
Lead Channel Impedance Value: 380 Ohm
Lead Channel Impedance Value: 4047 Ohm
Lead Channel Impedance Value: 4047 Ohm
Lead Channel Impedance Value: 494 Ohm
Lead Channel Impedance Value: 532 Ohm
Lead Channel Pacing Threshold Amplitude: 0.875 V
Lead Channel Pacing Threshold Pulse Width: 0.4 ms
Lead Channel Sensing Intrinsic Amplitude: 1.125 mV
Lead Channel Sensing Intrinsic Amplitude: 3.5 mV
Lead Channel Sensing Intrinsic Amplitude: 3.5 mV
Lead Channel Setting Pacing Amplitude: 1.5 V
Lead Channel Setting Pacing Amplitude: 2 V
Lead Channel Setting Pacing Pulse Width: 0.4 ms
MDC IDC LEAD IMPLANT DT: 20120113
MDC IDC LEAD IMPLANT DT: 20120113
MDC IDC LEAD LOCATION: 753858
MDC IDC MSMT BATTERY REMAINING LONGEVITY: 28 mo
MDC IDC MSMT BATTERY VOLTAGE: 2.91 V
MDC IDC MSMT LEADCHNL LV IMPEDANCE VALUE: 4047 Ohm
MDC IDC MSMT LEADCHNL LV IMPEDANCE VALUE: 684 Ohm
MDC IDC MSMT LEADCHNL RA IMPEDANCE VALUE: 266 Ohm
MDC IDC MSMT LEADCHNL RA IMPEDANCE VALUE: 399 Ohm
MDC IDC MSMT LEADCHNL RA PACING THRESHOLD AMPLITUDE: 0.875 V
MDC IDC MSMT LEADCHNL RA PACING THRESHOLD PULSEWIDTH: 0.4 ms
MDC IDC MSMT LEADCHNL RA SENSING INTR AMPL: 0.5 mV
MDC IDC MSMT LEADCHNL RV PACING THRESHOLD AMPLITUDE: 0.625 V
MDC IDC MSMT LEADCHNL RV PACING THRESHOLD PULSEWIDTH: 0.4 ms
MDC IDC PG IMPLANT DT: 20111019
MDC IDC SESS DTM: 20190723181418
MDC IDC SET LEADCHNL RV PACING PULSEWIDTH: 0.4 ms
MDC IDC SET LEADCHNL RV SENSING SENSITIVITY: 0.9 mV
MDC IDC STAT BRADY AP VP PERCENT: 0 %
MDC IDC STAT BRADY AP VS PERCENT: 0 %
MDC IDC STAT BRADY AS VS PERCENT: 0 %
MDC IDC STAT BRADY RA PERCENT PACED: 0 %

## 2017-12-28 DIAGNOSIS — 419620001 Death: Secondary | SNOMED CT | POA: Diagnosis not present

## 2017-12-28 DEATH — deceased

## 2018-01-09 IMAGING — CR DG HIP (WITH OR WITHOUT PELVIS) 2-3V*R*
3 series · 3 of 3 positions shown · non-contrast
Comparison: 06/05/2013

CLINICAL DATA: Unwitnessed fall yesterday with right hip pain.

EXAM:
DG HIP (WITH OR WITHOUT PELVIS) 2-3V RIGHT

[x pelvis (1 of 2)]
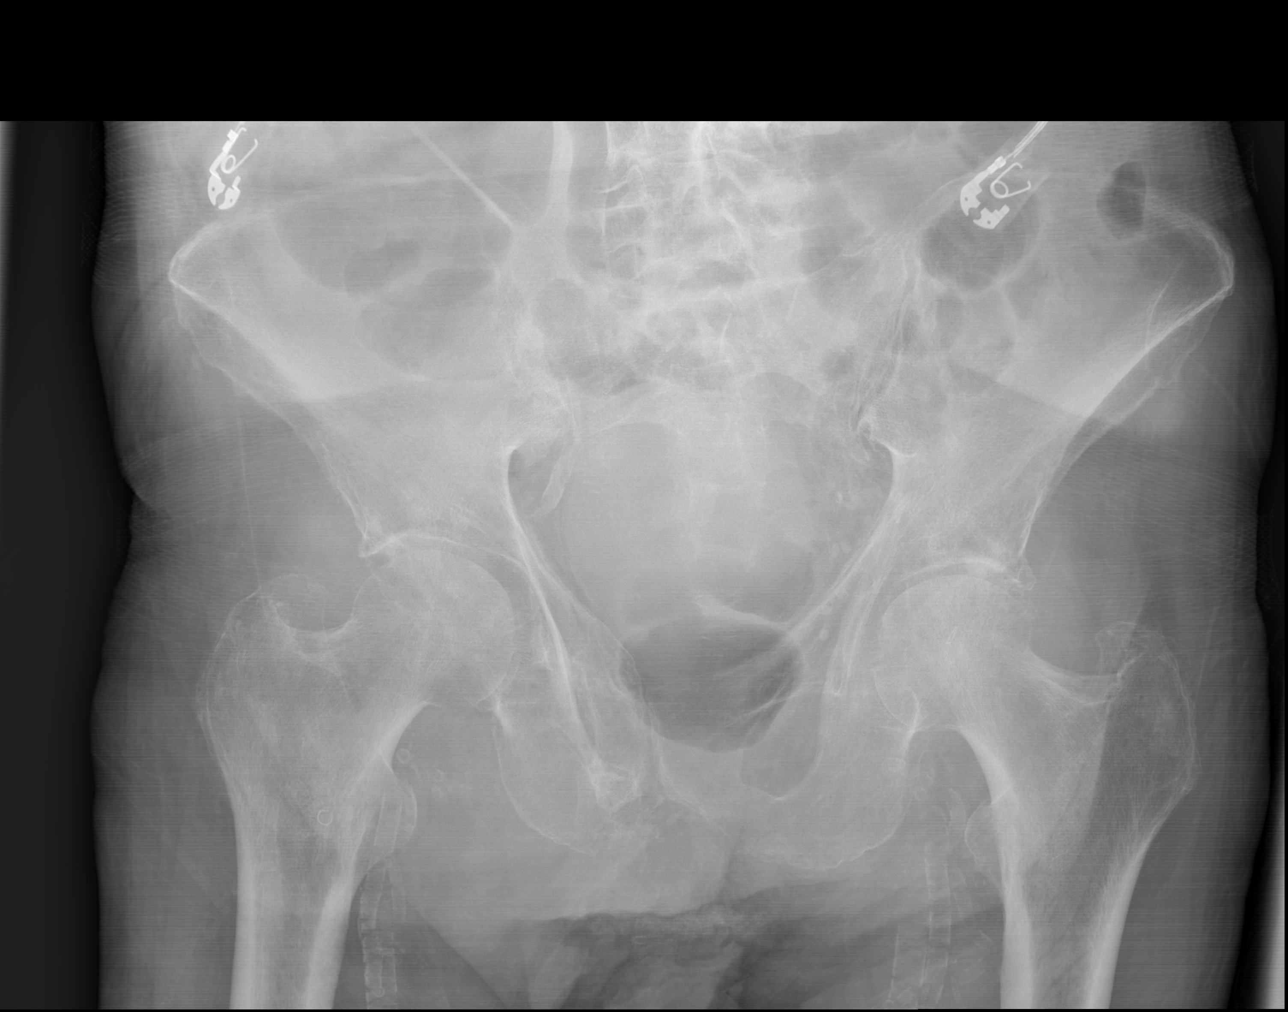

[x pelvis (2 of 2)]
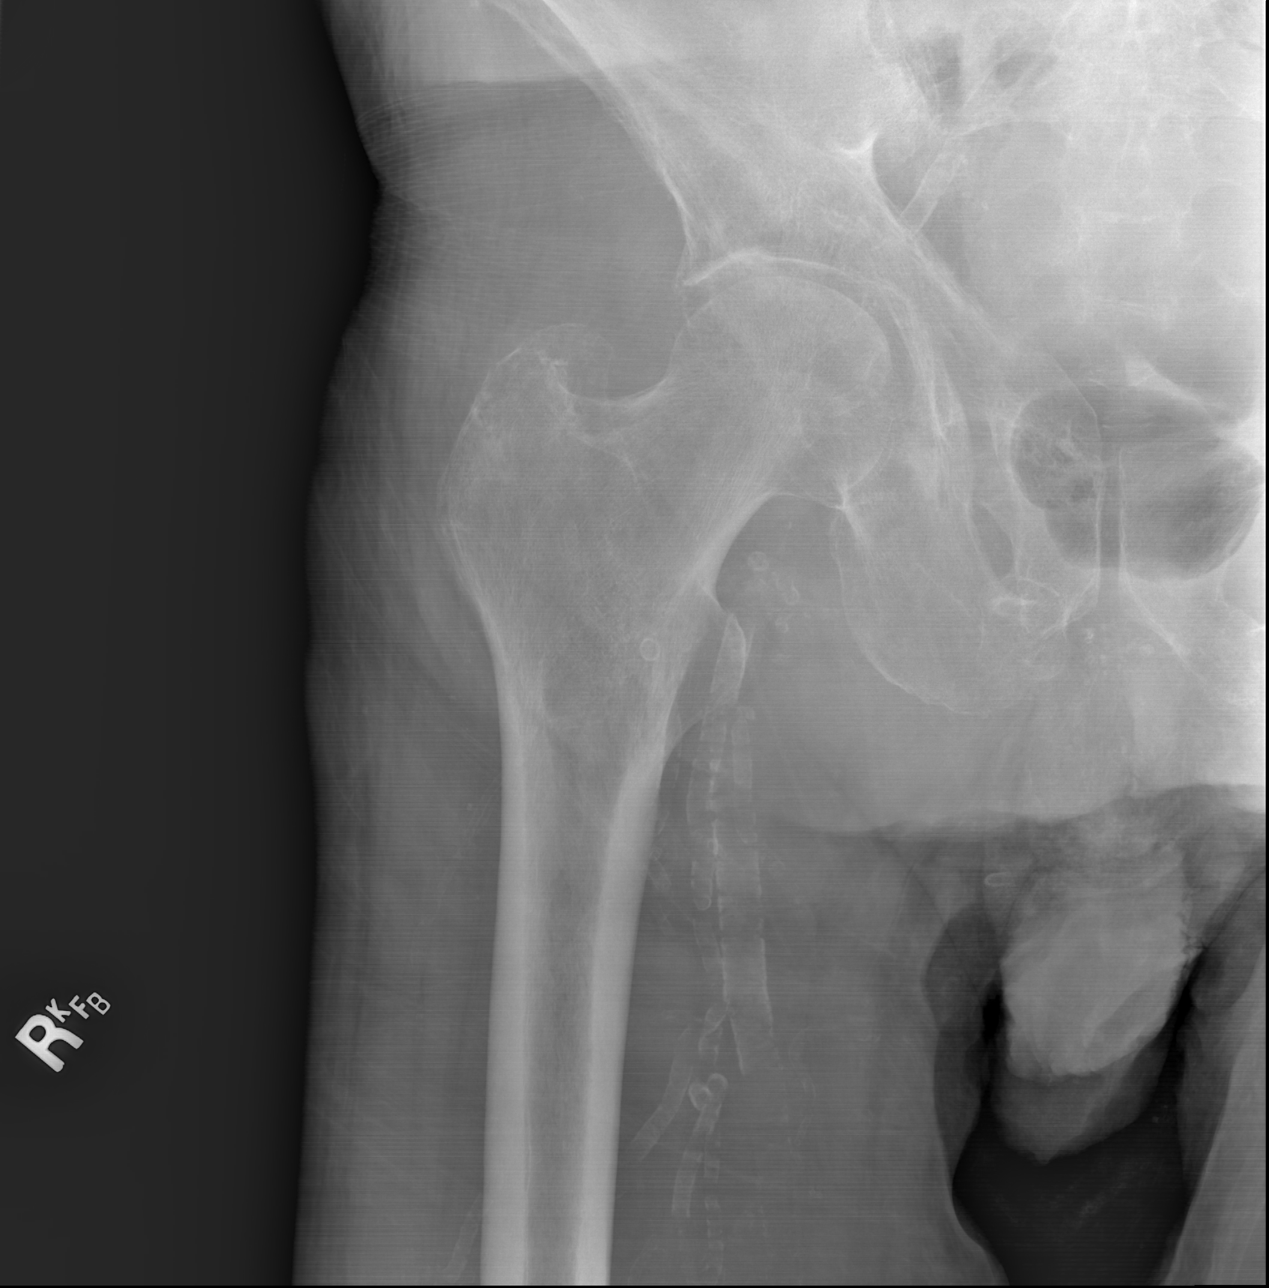

[x hip lat right]
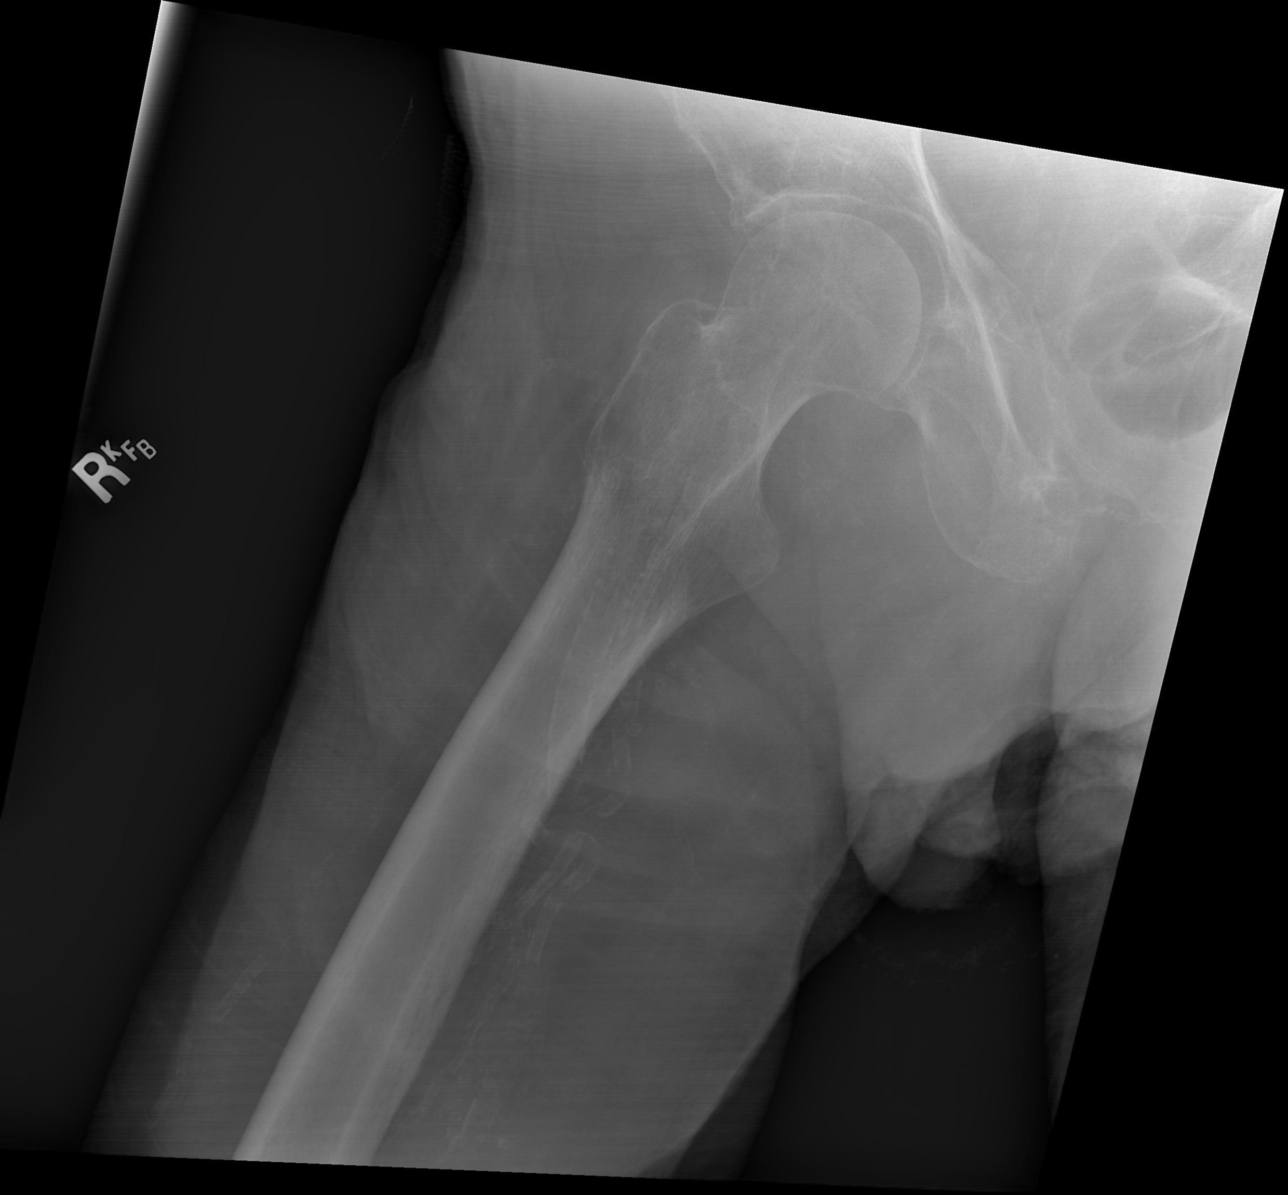

[3 of 3 positions shown; findings below may reference images not displayed]

FINDINGS: Remote healed fracture deformity of the right superior and inferior
pubic rami. Intact right hip joint without acute fracture. The
femoral head is seated within the acetabular component and is
spherical in appearance without flattening. Bony pelvis appears
intact. No diastasis of the pubic symphysis or sacroiliac joints.
There is mild lower lumbar degenerative facet arthropathy.
Bi-iliofemoral atherosclerotic calcifications.
IMPRESSION: Remote healed fracture deformities of the right superior and
inferior pubic rami. No acute osseous abnormality of the right hip
and pelvis.

## 2018-02-18 IMAGING — DX DG HIP (WITH OR WITHOUT PELVIS) 2-3V*R*
3 series · 3 of 3 positions shown · non-contrast
Comparison: 05/01/2017

CLINICAL DATA: Patient fell onto carpeted floor.  Pain.

EXAM:
DG HIP (WITH OR WITHOUT PELVIS) 2-3V RIGHT

[pelvis ap]
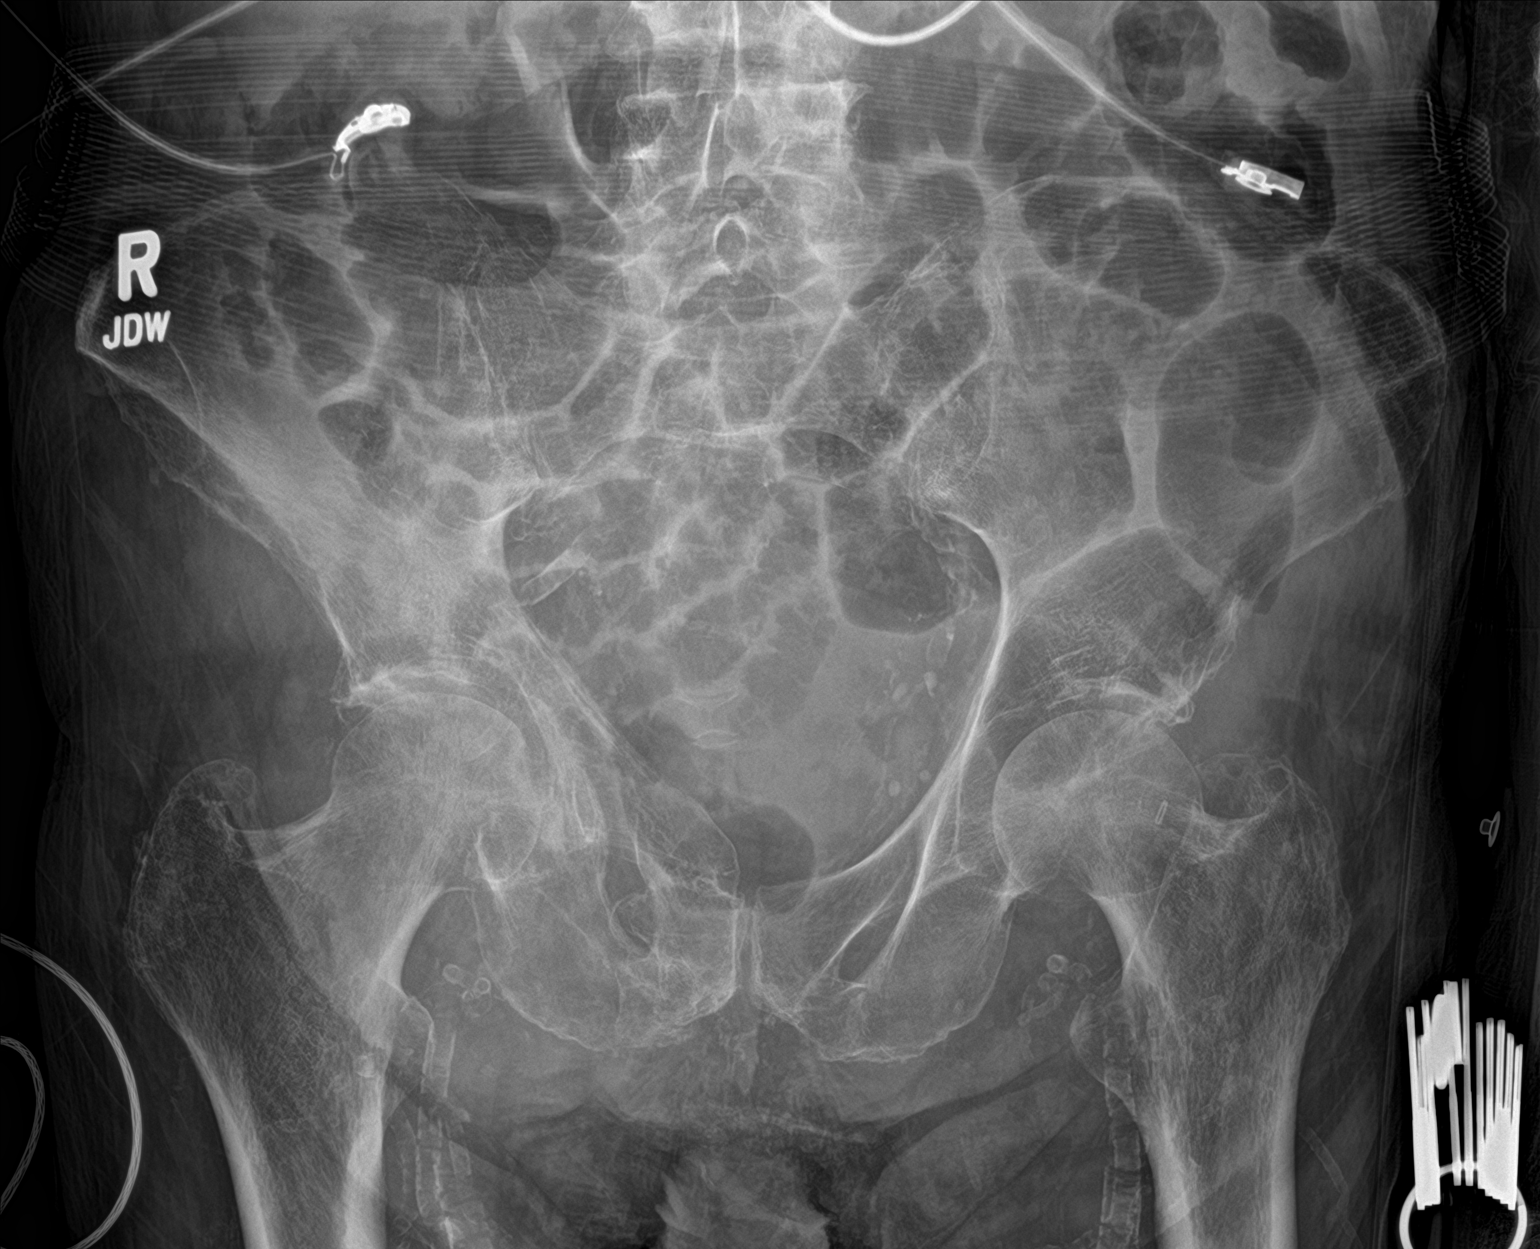

[hip ap]
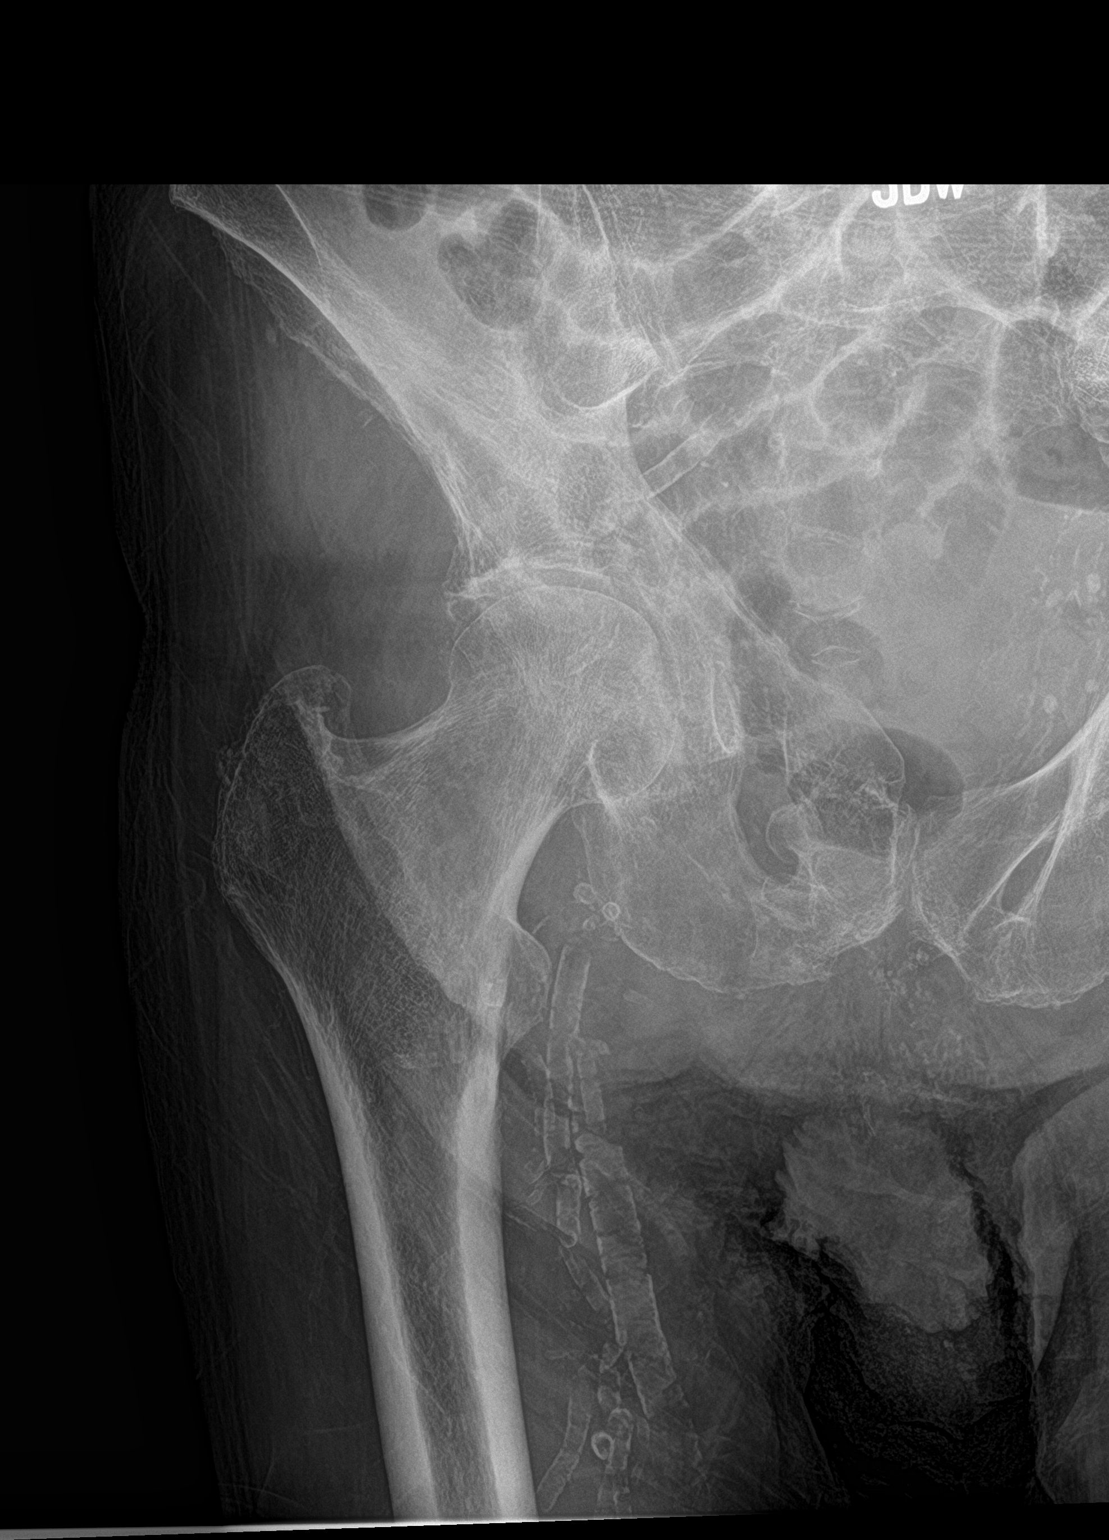

[hip lat]
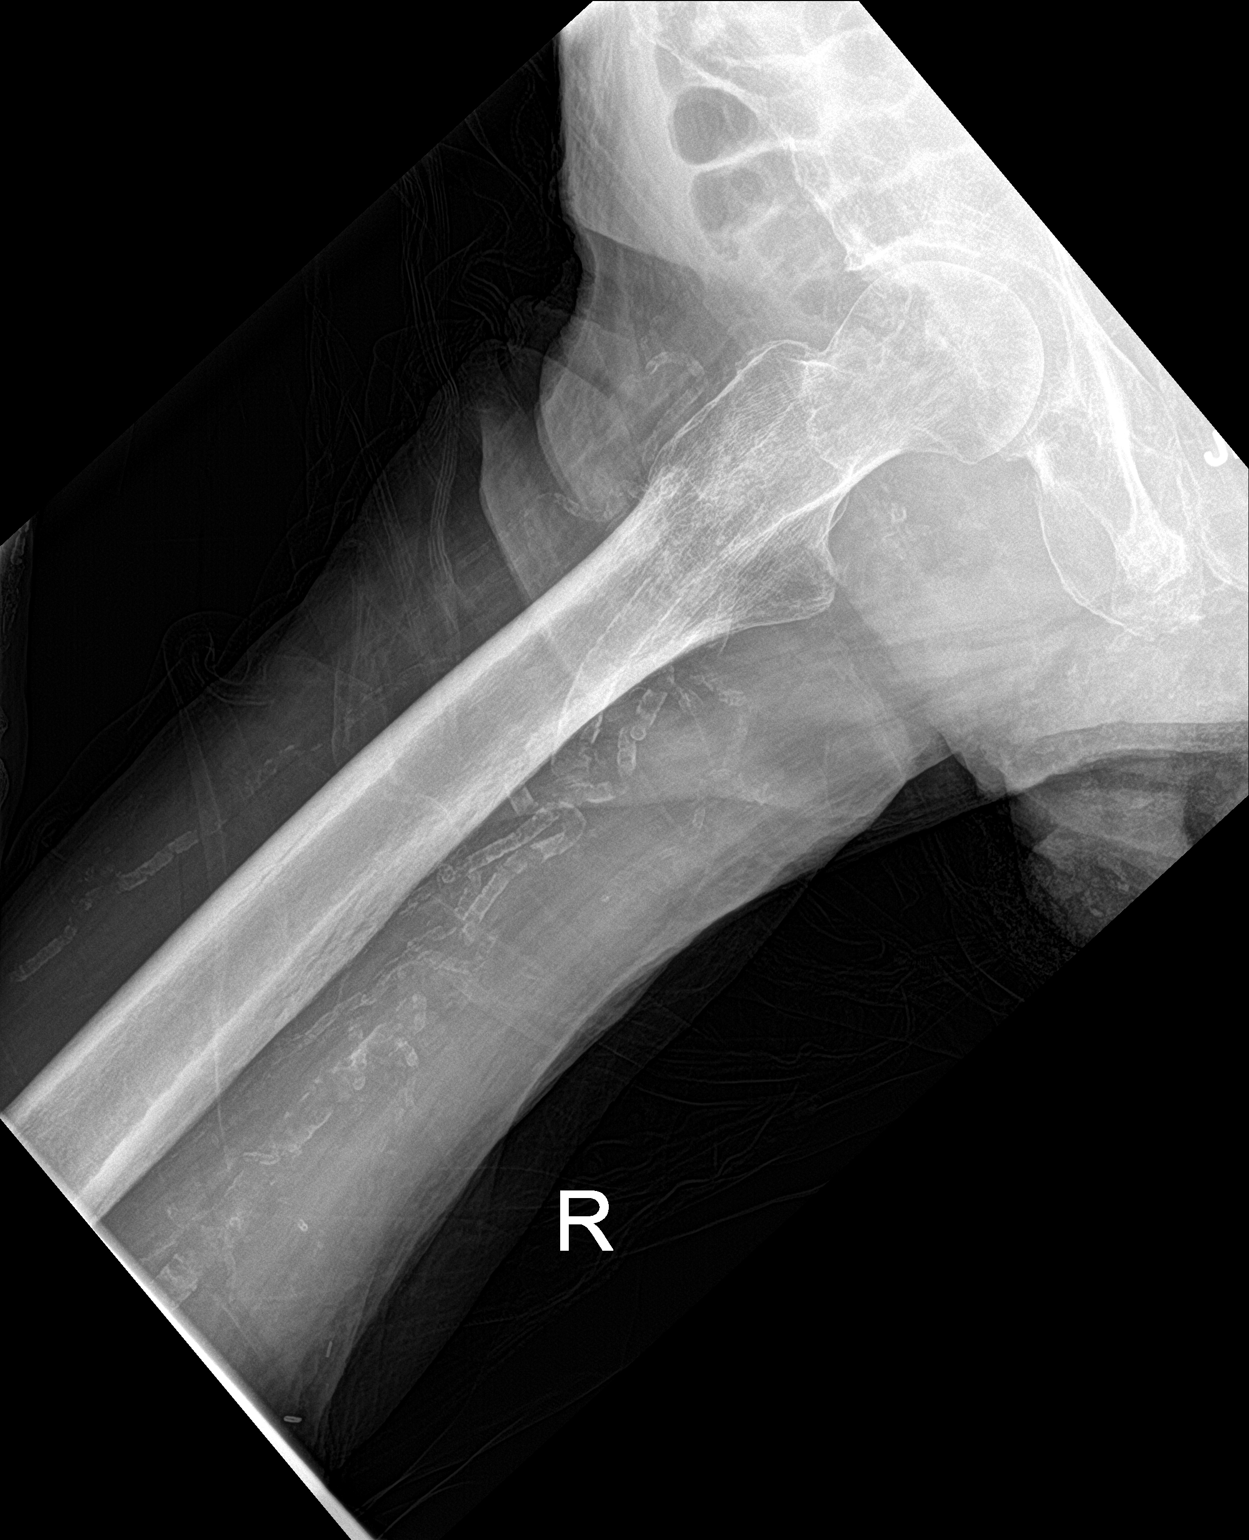

[3 of 3 positions shown; findings below may reference images not displayed]

FINDINGS: Bones are demineralized.  Overlying bowel limits fine bony detail.

There is no evidence of acute hip fracture or dislocation. Chronic
right superior and inferior pubic rami fractures. Lower lumbar facet
arthropathy. Bi-iliofemoral atherosclerotic calcifications.
IMPRESSION: Chronic right superior and inferior pubic rami fractures. No acute
osseous abnormality of the pelvis and right hip.

## 2018-07-28 NOTE — Telephone Encounter (Signed)
error
